# Patient Record
Sex: Female | Born: 1935 | Race: White | Hispanic: No | Smoking: Never smoker
Health system: Southern US, Community
[De-identification: ages and names within clinical notes are randomized; demographics above are authoritative.]

## PROBLEM LIST (undated history)

## (undated) DIAGNOSIS — I10 Essential (primary) hypertension: Secondary | ICD-10-CM

## (undated) DIAGNOSIS — E782 Mixed hyperlipidemia: Secondary | ICD-10-CM

## (undated) DIAGNOSIS — E119 Type 2 diabetes mellitus without complications: Secondary | ICD-10-CM

## (undated) DIAGNOSIS — N189 Chronic kidney disease, unspecified: Secondary | ICD-10-CM

## (undated) HISTORY — DX: Type 2 diabetes mellitus without complications: E11.9

## (undated) HISTORY — DX: Essential (primary) hypertension: I10

## (undated) HISTORY — DX: Mixed hyperlipidemia: E78.2

## (undated) HISTORY — DX: Chronic kidney disease, unspecified: N18.9

---

## 2005-02-17 ENCOUNTER — Ambulatory Visit: Payer: Self-pay | Admitting: Nephrology

## 2005-06-24 ENCOUNTER — Ambulatory Visit: Payer: Self-pay | Admitting: *Deleted

## 2005-09-23 ENCOUNTER — Ambulatory Visit: Payer: Self-pay | Admitting: *Deleted

## 2006-03-16 ENCOUNTER — Ambulatory Visit: Payer: Self-pay | Admitting: *Deleted

## 2006-04-08 ENCOUNTER — Ambulatory Visit: Payer: Self-pay | Admitting: Internal Medicine

## 2007-05-10 ENCOUNTER — Ambulatory Visit: Payer: Self-pay | Admitting: Internal Medicine

## 2008-07-31 ENCOUNTER — Ambulatory Visit: Payer: Self-pay | Admitting: Internal Medicine

## 2009-10-11 ENCOUNTER — Ambulatory Visit: Payer: Self-pay | Admitting: Internal Medicine

## 2011-03-05 ENCOUNTER — Ambulatory Visit: Payer: Self-pay | Admitting: Otolaryngology

## 2011-08-25 ENCOUNTER — Ambulatory Visit: Payer: Self-pay | Admitting: Internal Medicine

## 2011-08-27 ENCOUNTER — Ambulatory Visit: Payer: Self-pay | Admitting: Internal Medicine

## 2012-03-11 IMAGING — CT CT ORBITS WITHOUT CONTRAST
4 of 8 series · 12 of 30 positions shown, 13 images · non-contrast
Comparison: none

REASON FOR EXAM: cholesteatoma
COMMENTS:

[Series 8: temp prone left · axial · 0.20mm/px · z∈[-215,-184]mm · 3 of 108 slices shown, 4 images (1 of 2)]
[im 27/108  brain]
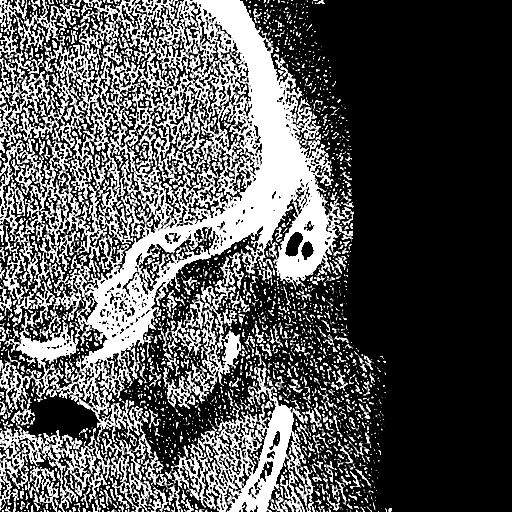
[im 27/108  bone]
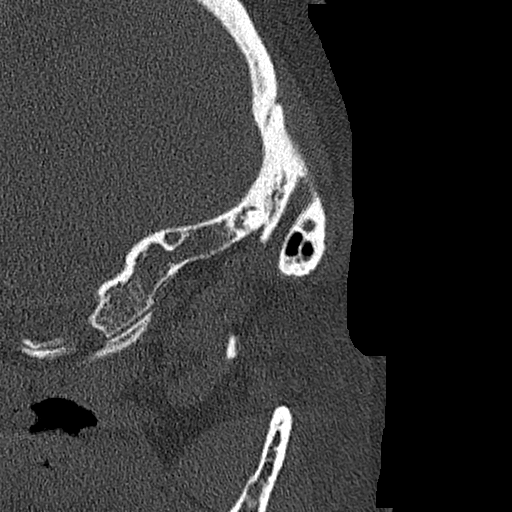
[im 54/108  bone]
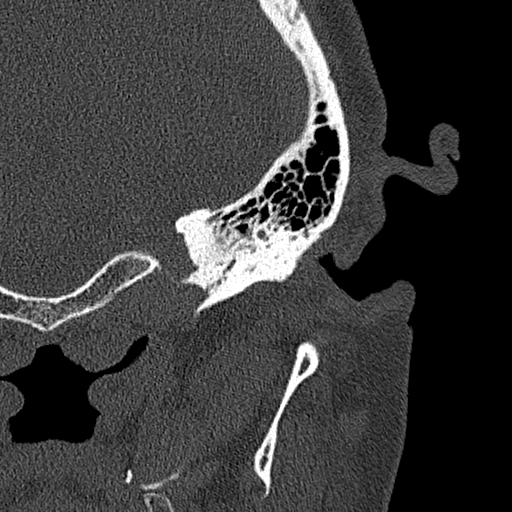
[im 81/108  bone]
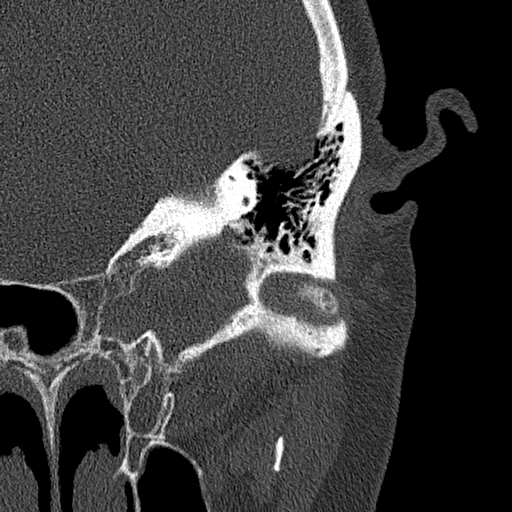

[Series 9: temp prone right · axial · 0.20mm/px · z∈[-216,-184]mm · 3 of 108 slices shown (1 of 2)]
[im 27/108  bone]
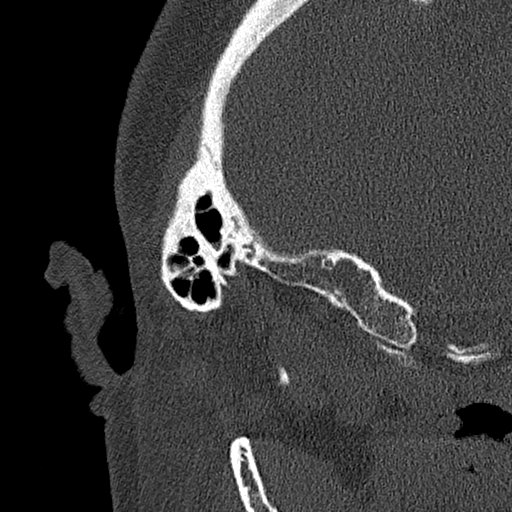
[im 54/108  bone]
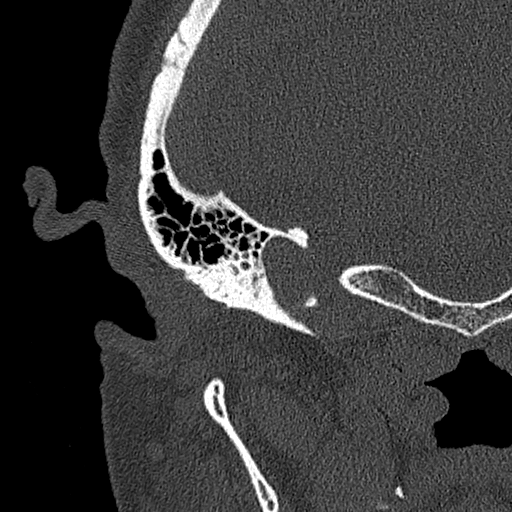
[im 81/108  bone]
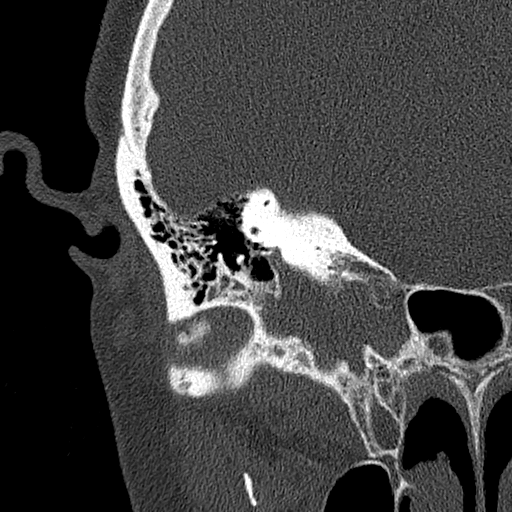

[Series 12: temp prone left · axial · 0.20mm/px · z∈[-185,-154]mm · 3 of 108 slices shown (2 of 2)]
[im 27/108  bone]
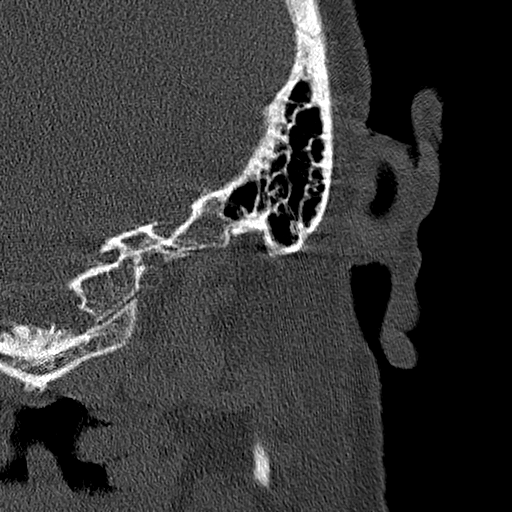
[im 54/108  bone]
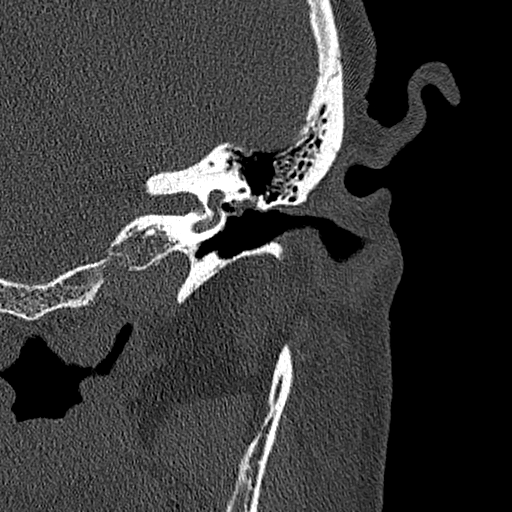
[im 81/108  bone]
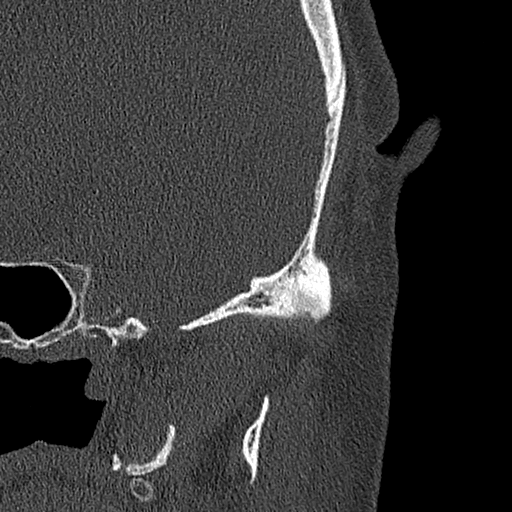

[Series 13: temp prone right · axial · 0.20mm/px · z∈[-185,-154]mm · 3 of 108 slices shown (2 of 2)]
[im 27/108  bone]
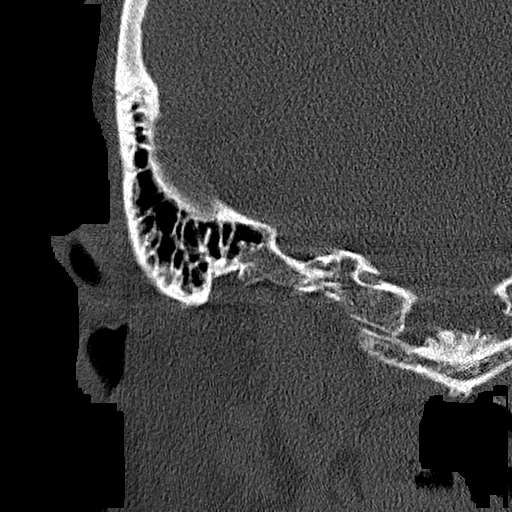
[im 54/108  bone]
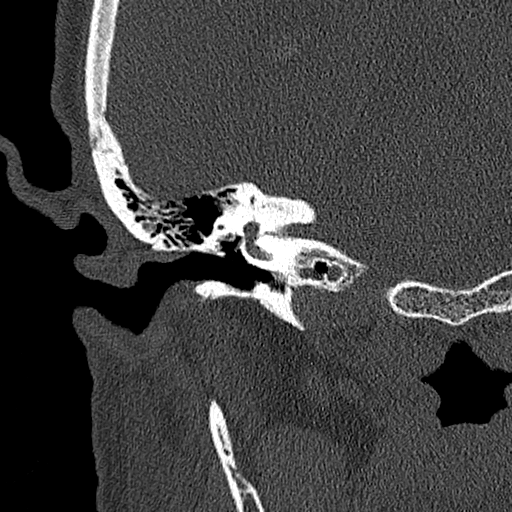
[im 81/108  bone]
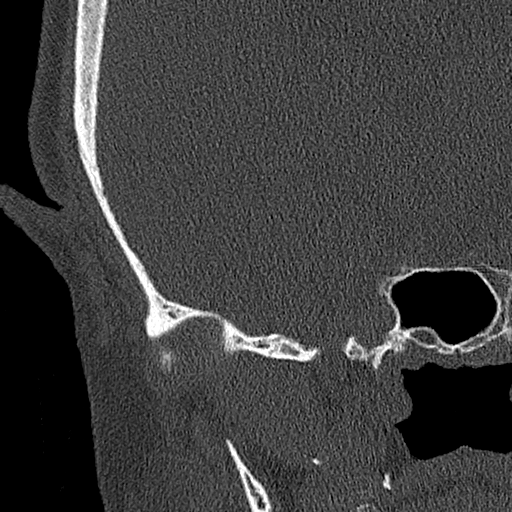

[12 of 30 positions shown; findings below may reference images not displayed]

PROCEDURE:     CT  - CT ORBITS OR TEMPORAL BONE WO  - March 05, 2011  [DATE]

RESULT:     Thin section magnified coronal and axial images using a bone
algorithm were obtained through the temporal bones.

On the right the external auditory canal is patent. The tympanic membrane
does not appear abnormally thickened. The ossicles are normal in appearance.
I do not see abnormal soft tissue densities in the middle ear cavity nor in
the mastoid air cells. The temporomandibular joint is grossly normal for
age. The internal auditory canal is normal in appearance.

On the left the external auditory canal is patent. There is mild retraction
of the tympanic membrane. The middle ear cavity demonstrates no definite
abnormal soft tissue to suggest a cholesteatoma. There is minimal soft
tissue density along the posterior wall of the middle ear cavity just
posterior to the margin of the tympanic membrane. The ossicles and cells do
not appear to be surrounded by abnormal soft tissue density. The scutum is
not eroded. No fluid is identified. The mastoid air cells are well
pneumatized.
IMPRESSION: I do not see objective evidence of a cholesteatoma on the
left or right. There is very minimal increased soft tissue density material
in the middle ear cavity which may be inflammatory. It is not diagnostic of
occult cholesteatoma. The mastoid air cells are well pneumatized. The
internal auditory canals are normal in appearance.

## 2012-08-29 ENCOUNTER — Ambulatory Visit: Payer: Self-pay | Admitting: Internal Medicine

## 2013-08-30 ENCOUNTER — Ambulatory Visit: Payer: Self-pay | Admitting: Internal Medicine

## 2014-09-03 ENCOUNTER — Ambulatory Visit: Payer: Self-pay | Admitting: Internal Medicine

## 2015-03-07 ENCOUNTER — Ambulatory Visit: Payer: Self-pay | Admitting: Gastroenterology

## 2017-04-20 DIAGNOSIS — S42201A Unspecified fracture of upper end of right humerus, initial encounter for closed fracture: Secondary | ICD-10-CM | POA: Insufficient documentation

## 2017-07-15 ENCOUNTER — Ambulatory Visit (INDEPENDENT_AMBULATORY_CARE_PROVIDER_SITE_OTHER): Payer: Self-pay | Admitting: Vascular Surgery

## 2017-07-15 ENCOUNTER — Encounter (INDEPENDENT_AMBULATORY_CARE_PROVIDER_SITE_OTHER): Payer: Self-pay

## 2020-02-05 ENCOUNTER — Ambulatory Visit: Payer: Medicare HMO | Attending: Internal Medicine

## 2020-02-05 DIAGNOSIS — Z23 Encounter for immunization: Secondary | ICD-10-CM | POA: Insufficient documentation

## 2020-02-05 NOTE — Progress Notes (Signed)
   Covid-19 Vaccination Clinic  Name:  [name] Griffin    MRN: [id number] DOB: [birth year]  02/05/2020  Ms. [name] Griffin was observed post Covid-19 immunization for 15 minutes without incidence. She was provided with Vaccine Information Sheet and instruction to access the V-Safe system.   Ms. [name] Griffin was instructed to call 911 with any severe reactions post vaccine: Marland Kitchen Difficulty breathing  . Swelling of your face and throat  . A fast heartbeat  . A bad rash all over your body  . Dizziness and weakness    Immunizations Administered    Name Date Dose VIS Date Route   Pfizer COVID-19 Vaccine 02/05/2020 10:01 AM 0.3 mL 11/24/2019 Intramuscular   Manufacturer: [employer], [employer]   Lot: [id number]   NDC: [address]-4

## 2020-02-28 ENCOUNTER — Ambulatory Visit: Payer: Medicare HMO | Attending: Internal Medicine

## 2020-02-28 DIAGNOSIS — Z23 Encounter for immunization: Secondary | ICD-10-CM

## 2020-02-28 NOTE — Progress Notes (Signed)
   Covid-19 Vaccination Clinic  Name:  [name] Griffin    MRN: [id number] DOB: 25-Sep-1928  02/28/2020  [name] Griffin was observed post Covid-19 immunization for 15 minutes without incident. She was provided with Vaccine Information Sheet and instruction to access the V-Safe system.   [name] Griffin was instructed to call 911 with any severe reactions post vaccine: Marland Kitchen Difficulty breathing  . Swelling of face and throat  . A fast heartbeat  . A bad rash all over body  . Dizziness and weakness   Immunizations Administered    Name Date Dose VIS Date Route   Pfizer COVID-19 Vaccine 02/28/2020 11:36 AM 0.3 mL 11/24/2019 Intramuscular   Manufacturer: [employer], [employer]   Lot: [id number]   NDC: [address]-6

## 2021-05-22 DIAGNOSIS — H6123 Impacted cerumen, bilateral: Secondary | ICD-10-CM | POA: Diagnosis not present

## 2021-05-22 DIAGNOSIS — H7102 Cholesteatoma of attic, left ear: Secondary | ICD-10-CM | POA: Diagnosis not present

## 2021-05-22 DIAGNOSIS — H66002 Acute suppurative otitis media without spontaneous rupture of ear drum, left ear: Secondary | ICD-10-CM | POA: Diagnosis not present

## 2021-08-04 DIAGNOSIS — I10 Essential (primary) hypertension: Secondary | ICD-10-CM | POA: Diagnosis not present

## 2021-08-04 DIAGNOSIS — E782 Mixed hyperlipidemia: Secondary | ICD-10-CM | POA: Diagnosis not present

## 2021-08-04 DIAGNOSIS — E1122 Type 2 diabetes mellitus with diabetic chronic kidney disease: Secondary | ICD-10-CM | POA: Diagnosis not present

## 2021-08-04 DIAGNOSIS — M109 Gout, unspecified: Secondary | ICD-10-CM | POA: Diagnosis not present

## 2021-08-21 DIAGNOSIS — H7102 Cholesteatoma of attic, left ear: Secondary | ICD-10-CM | POA: Diagnosis not present

## 2021-08-21 DIAGNOSIS — H65112 Acute and subacute allergic otitis media (mucoid) (sanguinous) (serous), left ear: Secondary | ICD-10-CM | POA: Diagnosis not present

## 2021-08-21 DIAGNOSIS — H6123 Impacted cerumen, bilateral: Secondary | ICD-10-CM | POA: Diagnosis not present

## 2021-09-18 DIAGNOSIS — H6123 Impacted cerumen, bilateral: Secondary | ICD-10-CM | POA: Diagnosis not present

## 2021-09-18 DIAGNOSIS — H6532 Chronic mucoid otitis media, left ear: Secondary | ICD-10-CM | POA: Diagnosis not present

## 2021-09-22 DIAGNOSIS — H353132 Nonexudative age-related macular degeneration, bilateral, intermediate dry stage: Secondary | ICD-10-CM | POA: Diagnosis not present

## 2021-11-24 DIAGNOSIS — H6532 Chronic mucoid otitis media, left ear: Secondary | ICD-10-CM | POA: Diagnosis not present

## 2021-11-24 DIAGNOSIS — H7102 Cholesteatoma of attic, left ear: Secondary | ICD-10-CM | POA: Diagnosis not present

## 2021-11-24 DIAGNOSIS — H6123 Impacted cerumen, bilateral: Secondary | ICD-10-CM | POA: Diagnosis not present

## 2022-01-05 DIAGNOSIS — E782 Mixed hyperlipidemia: Secondary | ICD-10-CM | POA: Diagnosis not present

## 2022-01-05 DIAGNOSIS — R7302 Impaired glucose tolerance (oral): Secondary | ICD-10-CM | POA: Diagnosis not present

## 2022-01-05 DIAGNOSIS — F04 Amnestic disorder due to known physiological condition: Secondary | ICD-10-CM | POA: Diagnosis not present

## 2022-01-05 DIAGNOSIS — M109 Gout, unspecified: Secondary | ICD-10-CM | POA: Diagnosis not present

## 2022-01-05 DIAGNOSIS — I10 Essential (primary) hypertension: Secondary | ICD-10-CM | POA: Diagnosis not present

## 2022-01-07 DIAGNOSIS — H7102 Cholesteatoma of attic, left ear: Secondary | ICD-10-CM | POA: Diagnosis not present

## 2022-01-07 DIAGNOSIS — H6532 Chronic mucoid otitis media, left ear: Secondary | ICD-10-CM | POA: Diagnosis not present

## 2022-01-07 DIAGNOSIS — H9210 Otorrhea, unspecified ear: Secondary | ICD-10-CM | POA: Diagnosis not present

## 2022-01-07 DIAGNOSIS — H6123 Impacted cerumen, bilateral: Secondary | ICD-10-CM | POA: Diagnosis not present

## 2022-02-04 DIAGNOSIS — H6123 Impacted cerumen, bilateral: Secondary | ICD-10-CM | POA: Diagnosis not present

## 2022-02-04 DIAGNOSIS — H7102 Cholesteatoma of attic, left ear: Secondary | ICD-10-CM | POA: Diagnosis not present

## 2022-02-27 DIAGNOSIS — R413 Other amnesia: Secondary | ICD-10-CM | POA: Diagnosis not present

## 2022-02-27 DIAGNOSIS — H9193 Unspecified hearing loss, bilateral: Secondary | ICD-10-CM | POA: Diagnosis not present

## 2022-02-27 DIAGNOSIS — Z7689 Persons encountering health services in other specified circumstances: Secondary | ICD-10-CM | POA: Diagnosis not present

## 2022-04-06 DIAGNOSIS — H7102 Cholesteatoma of attic, left ear: Secondary | ICD-10-CM | POA: Diagnosis not present

## 2022-04-06 DIAGNOSIS — H6123 Impacted cerumen, bilateral: Secondary | ICD-10-CM | POA: Diagnosis not present

## 2022-06-04 DIAGNOSIS — E119 Type 2 diabetes mellitus without complications: Secondary | ICD-10-CM | POA: Diagnosis not present

## 2022-06-04 DIAGNOSIS — E782 Mixed hyperlipidemia: Secondary | ICD-10-CM | POA: Diagnosis not present

## 2022-06-04 DIAGNOSIS — I10 Essential (primary) hypertension: Secondary | ICD-10-CM | POA: Diagnosis not present

## 2022-06-04 DIAGNOSIS — E1122 Type 2 diabetes mellitus with diabetic chronic kidney disease: Secondary | ICD-10-CM | POA: Diagnosis not present

## 2022-06-04 DIAGNOSIS — R7302 Impaired glucose tolerance (oral): Secondary | ICD-10-CM | POA: Diagnosis not present

## 2022-06-04 DIAGNOSIS — M109 Gout, unspecified: Secondary | ICD-10-CM | POA: Diagnosis not present

## 2022-06-08 DIAGNOSIS — H6123 Impacted cerumen, bilateral: Secondary | ICD-10-CM | POA: Diagnosis not present

## 2022-06-08 DIAGNOSIS — H7102 Cholesteatoma of attic, left ear: Secondary | ICD-10-CM | POA: Diagnosis not present

## 2022-06-12 DIAGNOSIS — E119 Type 2 diabetes mellitus without complications: Secondary | ICD-10-CM | POA: Diagnosis not present

## 2022-06-12 DIAGNOSIS — I10 Essential (primary) hypertension: Secondary | ICD-10-CM | POA: Diagnosis not present

## 2022-06-12 DIAGNOSIS — E785 Hyperlipidemia, unspecified: Secondary | ICD-10-CM | POA: Diagnosis not present

## 2022-07-20 DIAGNOSIS — H7102 Cholesteatoma of attic, left ear: Secondary | ICD-10-CM | POA: Diagnosis not present

## 2022-07-20 DIAGNOSIS — H6123 Impacted cerumen, bilateral: Secondary | ICD-10-CM | POA: Diagnosis not present

## 2022-08-31 DIAGNOSIS — H6123 Impacted cerumen, bilateral: Secondary | ICD-10-CM | POA: Diagnosis not present

## 2022-08-31 DIAGNOSIS — H7102 Cholesteatoma of attic, left ear: Secondary | ICD-10-CM | POA: Diagnosis not present

## 2022-09-12 DIAGNOSIS — E119 Type 2 diabetes mellitus without complications: Secondary | ICD-10-CM | POA: Diagnosis not present

## 2022-09-12 DIAGNOSIS — I10 Essential (primary) hypertension: Secondary | ICD-10-CM | POA: Diagnosis not present

## 2022-10-12 DIAGNOSIS — H7102 Cholesteatoma of attic, left ear: Secondary | ICD-10-CM | POA: Diagnosis not present

## 2022-10-12 DIAGNOSIS — H6123 Impacted cerumen, bilateral: Secondary | ICD-10-CM | POA: Diagnosis not present

## 2022-11-23 DIAGNOSIS — H6123 Impacted cerumen, bilateral: Secondary | ICD-10-CM | POA: Diagnosis not present

## 2022-11-23 DIAGNOSIS — H7102 Cholesteatoma of attic, left ear: Secondary | ICD-10-CM | POA: Diagnosis not present

## 2022-12-11 DIAGNOSIS — Z0000 Encounter for general adult medical examination without abnormal findings: Secondary | ICD-10-CM | POA: Diagnosis not present

## 2022-12-11 DIAGNOSIS — M109 Gout, unspecified: Secondary | ICD-10-CM | POA: Diagnosis not present

## 2022-12-11 DIAGNOSIS — I10 Essential (primary) hypertension: Secondary | ICD-10-CM | POA: Diagnosis not present

## 2022-12-11 DIAGNOSIS — Z23 Encounter for immunization: Secondary | ICD-10-CM | POA: Diagnosis not present

## 2022-12-11 DIAGNOSIS — Z0001 Encounter for general adult medical examination with abnormal findings: Secondary | ICD-10-CM | POA: Diagnosis not present

## 2022-12-11 DIAGNOSIS — E782 Mixed hyperlipidemia: Secondary | ICD-10-CM | POA: Diagnosis not present

## 2022-12-11 DIAGNOSIS — R7302 Impaired glucose tolerance (oral): Secondary | ICD-10-CM | POA: Diagnosis not present

## 2022-12-12 DIAGNOSIS — I10 Essential (primary) hypertension: Secondary | ICD-10-CM | POA: Diagnosis not present

## 2022-12-12 DIAGNOSIS — E119 Type 2 diabetes mellitus without complications: Secondary | ICD-10-CM | POA: Diagnosis not present

## 2023-01-04 DIAGNOSIS — H9212 Otorrhea, left ear: Secondary | ICD-10-CM | POA: Diagnosis not present

## 2023-01-04 DIAGNOSIS — H7102 Cholesteatoma of attic, left ear: Secondary | ICD-10-CM | POA: Diagnosis not present

## 2023-02-17 DIAGNOSIS — H6532 Chronic mucoid otitis media, left ear: Secondary | ICD-10-CM | POA: Diagnosis not present

## 2023-02-17 DIAGNOSIS — H6123 Impacted cerumen, bilateral: Secondary | ICD-10-CM | POA: Diagnosis not present

## 2023-02-17 DIAGNOSIS — H7102 Cholesteatoma of attic, left ear: Secondary | ICD-10-CM | POA: Diagnosis not present

## 2023-02-24 ENCOUNTER — Telehealth: Payer: Self-pay

## 2023-02-24 NOTE — Telephone Encounter (Signed)
HTN Review Call  [name],[name]  89 years, Female  DOB: 04-Jul-1928  M: [phone]  __________________________________________________ Hypertension Review (HC) Chart Review BP #1 reading (last): 120/60 on: 12/11/2022 BP #2 reading: 122/70 on: 06/04/2022 BP #3 reading: 118/72 on: 01/05/2022 Any of the last 3 BP > 140/90 mmHg?: No What recent interventions have been made by any provider to improve the patient's conditions in the last 3 months?: Office Visit: 12/11/22 [name] Maltese MD. For AWV. STARTED Donepezil 5 mg once a day.  Consults: 01/04/23 Otolaryngology [name] Griffin For left ear. No information available for any medication changes. Has there been any documented recent hospitalizations or ED visits since last visit with Clinical Lead?: No Adherence Review Does the [facility] have access to medication refill data?: Yes Adherence rates for STAR metric medications: Lisinopril 5 mg - 12/13/22 90 DS Adherence rates for medications indicated for disease state being reviewed: Lisinopril 5 mg - 12/13/22 90 DS Does the patient have >5 day gap between last estimated fill dates for any of the above medications?: No Disease State Questions Able to connect with the Patient?: Yes Is the patient monitoring his/her BP?: Yes How often are you checking your BP?: occasionally Home BP Reading #1 (most recent): Per patient its been good . I asked her to start writing them down .  Is the patient having any low BP Readings <90/60?: No Is the patient having any BP readings above >180/100?: No Is the patient's average BP>140/90?: No What is your blood pressure goal?: 120/80 Educate patient to inform proper points on checking BP at home:: Do not drink caffeine or smoke a cigarette at least 30 min. prior to checking., Sit with feet flat on the floor, arm at heart level. What diet changes have you made to improve your Blood Pressure Control?: eating more home-cooked meals, other Details: Meals on Wheels   What exercise are you doing to improve your Blood Pressure Control?: walking Engagement Notes [name] Griffin on 02/22/2023 09:12 AM HC Chart Review: 10 min 02/22/23  HC Assessment call time spent: 5 min 02/24/23   Clinical Lead Review Review Adherence gaps identified?: No Drug Therapy Problems identified?: No Assessment: Controlled Engagement Notes [name] Griffin on 02/24/2023 04:36 PM Reviewed: 2 mins

## 2023-04-30 DIAGNOSIS — H6121 Impacted cerumen, right ear: Secondary | ICD-10-CM | POA: Diagnosis not present

## 2023-04-30 DIAGNOSIS — H7102 Cholesteatoma of attic, left ear: Secondary | ICD-10-CM | POA: Diagnosis not present

## 2023-04-30 DIAGNOSIS — H6532 Chronic mucoid otitis media, left ear: Secondary | ICD-10-CM | POA: Diagnosis not present

## 2023-06-09 ENCOUNTER — Other Ambulatory Visit: Payer: Self-pay | Admitting: Internal Medicine

## 2023-06-11 ENCOUNTER — Ambulatory Visit: Payer: Medicare HMO | Admitting: Internal Medicine

## 2023-06-11 DIAGNOSIS — H7102 Cholesteatoma of attic, left ear: Secondary | ICD-10-CM | POA: Diagnosis not present

## 2023-06-16 ENCOUNTER — Other Ambulatory Visit: Payer: Self-pay | Admitting: Internal Medicine

## 2023-06-16 DIAGNOSIS — I10 Essential (primary) hypertension: Secondary | ICD-10-CM

## 2023-06-23 ENCOUNTER — Other Ambulatory Visit: Payer: Self-pay | Admitting: Internal Medicine

## 2023-06-23 NOTE — Telephone Encounter (Signed)
Patient needs appt for future refills 

## 2023-07-26 ENCOUNTER — Telehealth: Payer: Self-pay

## 2023-07-27 ENCOUNTER — Ambulatory Visit: Payer: Self-pay | Admitting: Internal Medicine

## 2023-07-27 ENCOUNTER — Encounter: Payer: Self-pay | Admitting: Internal Medicine

## 2023-07-27 VITALS — BP 130/74 | HR 57 | Ht 60.0 in | Wt 150.0 lb

## 2023-07-27 DIAGNOSIS — E119 Type 2 diabetes mellitus without complications: Secondary | ICD-10-CM | POA: Diagnosis not present

## 2023-07-27 DIAGNOSIS — H9113 Presbycusis, bilateral: Secondary | ICD-10-CM | POA: Diagnosis not present

## 2023-07-27 DIAGNOSIS — M1000 Idiopathic gout, unspecified site: Secondary | ICD-10-CM | POA: Diagnosis not present

## 2023-07-27 DIAGNOSIS — E782 Mixed hyperlipidemia: Secondary | ICD-10-CM | POA: Diagnosis not present

## 2023-07-27 DIAGNOSIS — N182 Chronic kidney disease, stage 2 (mild): Secondary | ICD-10-CM | POA: Diagnosis not present

## 2023-07-27 DIAGNOSIS — E1122 Type 2 diabetes mellitus with diabetic chronic kidney disease: Secondary | ICD-10-CM | POA: Diagnosis not present

## 2023-07-27 DIAGNOSIS — I10 Essential (primary) hypertension: Secondary | ICD-10-CM | POA: Diagnosis not present

## 2023-07-27 LAB — POCT CBG (FASTING - GLUCOSE)-MANUAL ENTRY: Glucose Fasting, POC: 72 mg/dL (ref 70–99)

## 2023-07-27 NOTE — Progress Notes (Signed)
Established Patient Office Visit  Subjective:  Patient ID: [name] Griffin, female    DOB: [birth year]  Age: 89 y.o. MRN: [id number]  Chief Complaint  Patient presents with   Follow-up    Follow up    Patient comes in for her 39-month follow-up.  She is accompanied by her family members.  Patient is very hard of hearing and has trouble answering questions but seems to be in good spirits and looks well put together.  She still lives independently with her family members checking on her.  Offers no new complaints.    No other concerns at this time.   Past Medical History:  Diagnosis Date   CKD (chronic kidney disease)    Diabetes mellitus without complication (HCC)    Essential hypertension, benign    Mixed hyperlipidemia     History reviewed. No pertinent surgical history.  Social History   Socioeconomic History   Marital status: Widowed    Spouse name: Not on file   Number of children: Not on file   Years of education: Not on file   Highest education level: Not on file  Occupational History   Not on file  Tobacco Use   Smoking status: Never   Smokeless tobacco: Never  Substance and Sexual Activity   Alcohol use: Not Currently   Drug use: Never   Sexual activity: Not on file  Other Topics Concern   Not on file  Social History Narrative   Not on file   Social Determinants of Health   Financial Resource Strain: Not on file  Food Insecurity: Not on file  Transportation Needs: Not on file  Physical Activity: Not on file  Stress: Not on file  Social Connections: Not on file  Intimate Partner Violence: Not on file    History reviewed. No pertinent family history.  No Known Allergies  Review of Systems  Constitutional: Negative.   HENT:  Positive for hearing loss. Negative for ear discharge.   Eyes: Negative.   Respiratory: Negative.  Negative for cough, shortness of breath and stridor.   Cardiovascular: Negative.  Negative for chest pain, palpitations and  leg swelling.  Gastrointestinal: Negative.  Negative for abdominal pain, constipation, diarrhea, heartburn, nausea and vomiting.  Genitourinary: Negative.  Negative for dysuria and flank pain.  Musculoskeletal: Negative.  Negative for joint pain and myalgias.  Skin: Negative.   Neurological: Negative.  Negative for dizziness and headaches.  Endo/Heme/Allergies: Negative.   Psychiatric/Behavioral: Negative.  Negative for depression and suicidal ideas. The patient is not nervous/anxious.        Objective:   BP 130/74   Pulse (!) 57   Ht 5' (1.524 m)   Wt 150 lb (68 kg)   SpO2 93%   BMI 29.29 kg/m   Vitals:   07/27/23 1142  BP: 130/74  Pulse: (!) 57  Height: 5' (1.524 m)  Weight: 150 lb (68 kg)  SpO2: 93%  BMI (Calculated): 29.3    Physical Exam Vitals and nursing note reviewed.  Constitutional:      Appearance: Normal appearance.  HENT:     Head: Normocephalic and atraumatic.     Nose: Nose normal.     Mouth/Throat:     Mouth: Mucous membranes are moist.     Pharynx: Oropharynx is clear.  Eyes:     Conjunctiva/sclera: Conjunctivae normal.     Pupils: Pupils are equal, round, and reactive to light.  Cardiovascular:     Rate and Rhythm: Normal rate  and regular rhythm.     Pulses: Normal pulses.     Heart sounds: Normal heart sounds. No murmur heard. Pulmonary:     Effort: Pulmonary effort is normal.     Breath sounds: Normal breath sounds. No wheezing.  Abdominal:     General: Bowel sounds are normal.     Palpations: Abdomen is soft.     Tenderness: There is no abdominal tenderness. There is no right CVA tenderness or left CVA tenderness.  Musculoskeletal:        General: Normal range of motion.     Cervical back: Normal range of motion.     Right lower leg: No edema.     Left lower leg: No edema.  Skin:    General: Skin is warm and dry.  Neurological:     General: No focal deficit present.     Mental Status: She is alert and oriented to person, place, and  time.  Psychiatric:        Mood and Affect: Mood normal.        Behavior: Behavior normal.      Results for orders placed or performed in visit on 07/27/23  POCT CBG (Fasting - Glucose)  Result Value Ref Range   Glucose Fasting, POC 72 70 - 99 mg/dL    Recent Results (from the past 2160 hour(s))  POCT CBG (Fasting - Glucose)     Status: None   Collection Time: 07/27/23 11:47 AM  Result Value Ref Range   Glucose Fasting, POC 72 70 - 99 mg/dL      Assessment & Plan:  Advised to continue her current medication.  Will check labs today. Problem List Items Addressed This Visit     Type 2 diabetes mellitus without complication, without long-term current use of insulin (HCC) - Primary   Relevant Orders   POCT CBG (Fasting - Glucose) (Completed)   Hemoglobin A1c   Idiopathic gout   Relevant Orders   CBC with Diff   Uric acid   Essential hypertension, benign   Relevant Orders   CMP14+EGFR   Stage 2 chronic kidney disease   Presbycusis of both ears   Mixed hyperlipidemia    Return in about 6 months (around 01/27/2024).   Total time spent: 30 minutes  Margaretann Loveless, MD  07/27/2023   This document may have been prepared by [facility] Voice Recognition software and as such may include unintentional dictation errors.

## 2023-07-29 NOTE — Progress Notes (Signed)
Patient notified

## 2023-08-06 DIAGNOSIS — H7102 Cholesteatoma of attic, left ear: Secondary | ICD-10-CM | POA: Diagnosis not present

## 2023-08-06 DIAGNOSIS — H6123 Impacted cerumen, bilateral: Secondary | ICD-10-CM | POA: Diagnosis not present

## 2023-09-19 ENCOUNTER — Other Ambulatory Visit: Payer: Self-pay | Admitting: Internal Medicine

## 2023-10-06 DIAGNOSIS — H6123 Impacted cerumen, bilateral: Secondary | ICD-10-CM | POA: Diagnosis not present

## 2023-10-06 DIAGNOSIS — H7102 Cholesteatoma of attic, left ear: Secondary | ICD-10-CM | POA: Diagnosis not present

## 2023-11-17 DIAGNOSIS — H7102 Cholesteatoma of attic, left ear: Secondary | ICD-10-CM | POA: Diagnosis not present

## 2023-11-17 DIAGNOSIS — H6123 Impacted cerumen, bilateral: Secondary | ICD-10-CM | POA: Diagnosis not present

## 2023-12-01 ENCOUNTER — Other Ambulatory Visit: Payer: Self-pay | Admitting: Internal Medicine

## 2023-12-01 DIAGNOSIS — I10 Essential (primary) hypertension: Secondary | ICD-10-CM

## 2023-12-08 ENCOUNTER — Other Ambulatory Visit: Payer: Self-pay | Admitting: Internal Medicine

## 2023-12-16 ENCOUNTER — Other Ambulatory Visit: Payer: Self-pay | Admitting: Internal Medicine

## 2023-12-16 DIAGNOSIS — I10 Essential (primary) hypertension: Secondary | ICD-10-CM

## 2023-12-29 DIAGNOSIS — H6123 Impacted cerumen, bilateral: Secondary | ICD-10-CM | POA: Diagnosis not present

## 2023-12-29 DIAGNOSIS — H7102 Cholesteatoma of attic, left ear: Secondary | ICD-10-CM | POA: Diagnosis not present

## 2024-01-27 ENCOUNTER — Encounter: Payer: Self-pay | Admitting: Internal Medicine

## 2024-01-27 ENCOUNTER — Ambulatory Visit: Payer: Medicare HMO | Admitting: Internal Medicine

## 2024-01-27 VITALS — BP 144/80 | HR 68 | Ht 60.0 in

## 2024-01-27 DIAGNOSIS — I10 Essential (primary) hypertension: Secondary | ICD-10-CM

## 2024-01-27 DIAGNOSIS — R413 Other amnesia: Secondary | ICD-10-CM | POA: Diagnosis not present

## 2024-01-27 DIAGNOSIS — N182 Chronic kidney disease, stage 2 (mild): Secondary | ICD-10-CM

## 2024-01-27 DIAGNOSIS — M1000 Idiopathic gout, unspecified site: Secondary | ICD-10-CM

## 2024-01-27 DIAGNOSIS — H9113 Presbycusis, bilateral: Secondary | ICD-10-CM | POA: Diagnosis not present

## 2024-01-27 DIAGNOSIS — E119 Type 2 diabetes mellitus without complications: Secondary | ICD-10-CM

## 2024-01-27 MED ORDER — DONEPEZIL HCL 10 MG PO TABS: 10.0000 mg | ORAL_TABLET | Freq: Every day | ORAL | 2 refills | Status: DC

## 2024-01-27 NOTE — Progress Notes (Signed)
Established Patient Office Visit  Subjective:  Patient ID: [name] Griffin, female    DOB: [birth year]  Age: 89 y.o. MRN: [id number]  Chief Complaint  Patient presents with   Follow-up    6 month follow up    Patient comes in for her 73-month follow-up accompanied by her family members.  She seems to be in good spirits and and nicely dressed.  She is still hard of hearing and only her right sided hearing aid works.  Getting more forgetful now, will increase her Aricept to 10 mg/day.  Mentions knee pain which is now affecting her ambulation.  Will set up home physical therapy for her.  She still lives independently at her house and gets Meals on Wheels.  No other complaints. Will check labs on next visit.    No other concerns at this time.   Past Medical History:  Diagnosis Date   CKD (chronic kidney disease)    Diabetes mellitus without complication (HCC)    Essential hypertension, benign    Mixed hyperlipidemia     History reviewed. No pertinent surgical history.  Social History   Socioeconomic History   Marital status: Widowed    Spouse name: Not on file   Number of children: Not on file   Years of education: Not on file   Highest education level: Not on file  Occupational History   Not on file  Tobacco Use   Smoking status: Never   Smokeless tobacco: Never  Substance and Sexual Activity   Alcohol use: Not Currently   Drug use: Never   Sexual activity: Not on file  Other Topics Concern   Not on file  Social History Narrative   Not on file   Social Drivers of Health   Financial Resource Strain: Not on file  Food Insecurity: Not on file  Transportation Needs: Not on file  Physical Activity: Not on file  Stress: Not on file  Social Connections: Not on file  Intimate Partner Violence: Not on file    History reviewed. No pertinent family history.  No Known Allergies  Outpatient Medications Prior to Visit  Medication Sig   allopurinol (ZYLOPRIM) 100 MG  tablet Take 100 mg by mouth daily.   atenolol (TENORMIN) 25 MG tablet TAKE 1 TABLET BY MOUTH EVERY DAY   furosemide (LASIX) 20 MG tablet TAKE 1 TABLET BY MOUTH EVERY DAY   lisinopril (ZESTRIL) 5 MG tablet TAKE 1 TABLET BY MOUTH EVERY DAY   [DISCONTINUED] donepezil (ARICEPT) 5 MG tablet TAKE 1 TABLET BY MOUTH EVERY DAY   No facility-administered medications prior to visit.    Review of Systems  Constitutional: Negative.  Negative for chills, fever, malaise/fatigue and weight loss.  HENT:  Positive for hearing loss. Negative for congestion, sinus pain and tinnitus.   Eyes: Negative.   Respiratory: Negative.  Negative for cough and shortness of breath.   Cardiovascular: Negative.  Negative for chest pain, palpitations and leg swelling.  Gastrointestinal: Negative.  Negative for abdominal pain, constipation, diarrhea, heartburn, nausea and vomiting.  Genitourinary: Negative.  Negative for dysuria and flank pain.  Musculoskeletal:  Positive for joint pain. Negative for myalgias.  Skin: Negative.   Neurological: Negative.  Negative for dizziness, tingling, tremors, sensory change, focal weakness and headaches.  Endo/Heme/Allergies: Negative.   Psychiatric/Behavioral: Negative.  Negative for depression and suicidal ideas. The patient is not nervous/anxious.        Objective:   BP (!) 144/80   Pulse 68  Ht 5' (1.524 m)   SpO2 95%   BMI 29.29 kg/m   Vitals:   01/27/24 1259  BP: (!) 144/80  Pulse: 68  Height: 5' (1.524 m)  Weight: Comment: unable to weigh  SpO2: 95%    Physical Exam Vitals and nursing note reviewed.  Constitutional:      Appearance: Normal appearance.  HENT:     Head: Normocephalic and atraumatic.     Nose: Nose normal.     Mouth/Throat:     Mouth: Mucous membranes are moist.     Pharynx: Oropharynx is clear.  Eyes:     Conjunctiva/sclera: Conjunctivae normal.     Pupils: Pupils are equal, round, and reactive to light.  Cardiovascular:     Rate and  Rhythm: Normal rate and regular rhythm.     Pulses: Normal pulses.     Heart sounds: Normal heart sounds. No murmur heard. Pulmonary:     Effort: Pulmonary effort is normal.     Breath sounds: Normal breath sounds. No wheezing.  Abdominal:     General: Bowel sounds are normal.     Palpations: Abdomen is soft.     Tenderness: There is no abdominal tenderness. There is no right CVA tenderness or left CVA tenderness.  Musculoskeletal:        General: Normal range of motion.     Cervical back: Normal range of motion.     Right lower leg: No edema.     Left lower leg: No edema.  Skin:    General: Skin is warm and dry.  Neurological:     General: No focal deficit present.     Mental Status: She is alert and oriented to person, place, and time.  Psychiatric:        Mood and Affect: Mood normal.        Behavior: Behavior normal.      No results found for any visits on 01/27/24.  No results found for this or any previous visit (from the past 2160 hours).    Assessment & Plan:  Patient advised to continue her medications.  Set up home health with physical therapy.  Will check labs at next visit. Problem List Items Addressed This Visit     Type 2 diabetes mellitus without complication, without long-term current use of insulin (HCC)   Idiopathic gout   Essential hypertension, benign - Primary   Stage 2 chronic kidney disease   Presbycusis of both ears   Memory impairment   Relevant Medications   donepezil (ARICEPT) 10 MG tablet    Return in about 6 months (around 07/26/2024).   Total time spent: 30 minutes  Margaretann Loveless, MD  01/27/2024   This document may have been prepared by [facility] Voice Recognition software and as such may include unintentional dictation errors.

## 2024-02-04 ENCOUNTER — Other Ambulatory Visit: Payer: Self-pay | Admitting: Internal Medicine

## 2024-02-04 DIAGNOSIS — M1712 Unilateral primary osteoarthritis, left knee: Secondary | ICD-10-CM

## 2024-02-04 DIAGNOSIS — G8929 Other chronic pain: Secondary | ICD-10-CM

## 2024-02-08 DIAGNOSIS — R413 Other amnesia: Secondary | ICD-10-CM | POA: Diagnosis not present

## 2024-02-08 DIAGNOSIS — I129 Hypertensive chronic kidney disease with stage 1 through stage 4 chronic kidney disease, or unspecified chronic kidney disease: Secondary | ICD-10-CM | POA: Diagnosis not present

## 2024-02-08 DIAGNOSIS — E1122 Type 2 diabetes mellitus with diabetic chronic kidney disease: Secondary | ICD-10-CM | POA: Diagnosis not present

## 2024-02-08 DIAGNOSIS — N182 Chronic kidney disease, stage 2 (mild): Secondary | ICD-10-CM | POA: Diagnosis not present

## 2024-02-08 DIAGNOSIS — M1712 Unilateral primary osteoarthritis, left knee: Secondary | ICD-10-CM | POA: Diagnosis not present

## 2024-02-08 DIAGNOSIS — Z9181 History of falling: Secondary | ICD-10-CM | POA: Diagnosis not present

## 2024-02-08 DIAGNOSIS — H9113 Presbycusis, bilateral: Secondary | ICD-10-CM | POA: Diagnosis not present

## 2024-02-08 DIAGNOSIS — E782 Mixed hyperlipidemia: Secondary | ICD-10-CM | POA: Diagnosis not present

## 2024-02-08 DIAGNOSIS — M1000 Idiopathic gout, unspecified site: Secondary | ICD-10-CM | POA: Diagnosis not present

## 2024-02-09 DIAGNOSIS — H6123 Impacted cerumen, bilateral: Secondary | ICD-10-CM | POA: Diagnosis not present

## 2024-02-09 DIAGNOSIS — H7102 Cholesteatoma of attic, left ear: Secondary | ICD-10-CM | POA: Diagnosis not present

## 2024-02-09 DIAGNOSIS — H6532 Chronic mucoid otitis media, left ear: Secondary | ICD-10-CM | POA: Diagnosis not present

## 2024-02-14 DIAGNOSIS — Z9181 History of falling: Secondary | ICD-10-CM | POA: Diagnosis not present

## 2024-02-14 DIAGNOSIS — E782 Mixed hyperlipidemia: Secondary | ICD-10-CM | POA: Diagnosis not present

## 2024-02-14 DIAGNOSIS — I129 Hypertensive chronic kidney disease with stage 1 through stage 4 chronic kidney disease, or unspecified chronic kidney disease: Secondary | ICD-10-CM | POA: Diagnosis not present

## 2024-02-14 DIAGNOSIS — R413 Other amnesia: Secondary | ICD-10-CM | POA: Diagnosis not present

## 2024-02-14 DIAGNOSIS — M1000 Idiopathic gout, unspecified site: Secondary | ICD-10-CM | POA: Diagnosis not present

## 2024-02-14 DIAGNOSIS — E1122 Type 2 diabetes mellitus with diabetic chronic kidney disease: Secondary | ICD-10-CM | POA: Diagnosis not present

## 2024-02-14 DIAGNOSIS — M1712 Unilateral primary osteoarthritis, left knee: Secondary | ICD-10-CM | POA: Diagnosis not present

## 2024-02-14 DIAGNOSIS — H9113 Presbycusis, bilateral: Secondary | ICD-10-CM | POA: Diagnosis not present

## 2024-02-14 DIAGNOSIS — N182 Chronic kidney disease, stage 2 (mild): Secondary | ICD-10-CM | POA: Diagnosis not present

## 2024-02-23 DIAGNOSIS — E782 Mixed hyperlipidemia: Secondary | ICD-10-CM | POA: Diagnosis not present

## 2024-02-23 DIAGNOSIS — N182 Chronic kidney disease, stage 2 (mild): Secondary | ICD-10-CM | POA: Diagnosis not present

## 2024-02-23 DIAGNOSIS — R413 Other amnesia: Secondary | ICD-10-CM | POA: Diagnosis not present

## 2024-02-23 DIAGNOSIS — H9113 Presbycusis, bilateral: Secondary | ICD-10-CM | POA: Diagnosis not present

## 2024-02-23 DIAGNOSIS — M1712 Unilateral primary osteoarthritis, left knee: Secondary | ICD-10-CM | POA: Diagnosis not present

## 2024-02-23 DIAGNOSIS — I129 Hypertensive chronic kidney disease with stage 1 through stage 4 chronic kidney disease, or unspecified chronic kidney disease: Secondary | ICD-10-CM | POA: Diagnosis not present

## 2024-02-23 DIAGNOSIS — Z9181 History of falling: Secondary | ICD-10-CM | POA: Diagnosis not present

## 2024-02-23 DIAGNOSIS — E1122 Type 2 diabetes mellitus with diabetic chronic kidney disease: Secondary | ICD-10-CM | POA: Diagnosis not present

## 2024-02-23 DIAGNOSIS — M1000 Idiopathic gout, unspecified site: Secondary | ICD-10-CM | POA: Diagnosis not present

## 2024-03-06 ENCOUNTER — Other Ambulatory Visit: Payer: Self-pay | Admitting: Internal Medicine

## 2024-03-07 DIAGNOSIS — Z9181 History of falling: Secondary | ICD-10-CM | POA: Diagnosis not present

## 2024-03-07 DIAGNOSIS — R413 Other amnesia: Secondary | ICD-10-CM | POA: Diagnosis not present

## 2024-03-07 DIAGNOSIS — H9113 Presbycusis, bilateral: Secondary | ICD-10-CM | POA: Diagnosis not present

## 2024-03-07 DIAGNOSIS — M1000 Idiopathic gout, unspecified site: Secondary | ICD-10-CM | POA: Diagnosis not present

## 2024-03-07 DIAGNOSIS — E1122 Type 2 diabetes mellitus with diabetic chronic kidney disease: Secondary | ICD-10-CM | POA: Diagnosis not present

## 2024-03-07 DIAGNOSIS — M1712 Unilateral primary osteoarthritis, left knee: Secondary | ICD-10-CM | POA: Diagnosis not present

## 2024-03-07 DIAGNOSIS — N182 Chronic kidney disease, stage 2 (mild): Secondary | ICD-10-CM | POA: Diagnosis not present

## 2024-03-07 DIAGNOSIS — E782 Mixed hyperlipidemia: Secondary | ICD-10-CM | POA: Diagnosis not present

## 2024-03-07 DIAGNOSIS — I129 Hypertensive chronic kidney disease with stage 1 through stage 4 chronic kidney disease, or unspecified chronic kidney disease: Secondary | ICD-10-CM | POA: Diagnosis not present

## 2024-03-09 DIAGNOSIS — E782 Mixed hyperlipidemia: Secondary | ICD-10-CM | POA: Diagnosis not present

## 2024-03-09 DIAGNOSIS — E1122 Type 2 diabetes mellitus with diabetic chronic kidney disease: Secondary | ICD-10-CM | POA: Diagnosis not present

## 2024-03-09 DIAGNOSIS — Z9181 History of falling: Secondary | ICD-10-CM | POA: Diagnosis not present

## 2024-03-09 DIAGNOSIS — R413 Other amnesia: Secondary | ICD-10-CM | POA: Diagnosis not present

## 2024-03-09 DIAGNOSIS — N182 Chronic kidney disease, stage 2 (mild): Secondary | ICD-10-CM | POA: Diagnosis not present

## 2024-03-09 DIAGNOSIS — I129 Hypertensive chronic kidney disease with stage 1 through stage 4 chronic kidney disease, or unspecified chronic kidney disease: Secondary | ICD-10-CM | POA: Diagnosis not present

## 2024-03-09 DIAGNOSIS — M1000 Idiopathic gout, unspecified site: Secondary | ICD-10-CM | POA: Diagnosis not present

## 2024-03-09 DIAGNOSIS — H9113 Presbycusis, bilateral: Secondary | ICD-10-CM | POA: Diagnosis not present

## 2024-03-09 DIAGNOSIS — M1712 Unilateral primary osteoarthritis, left knee: Secondary | ICD-10-CM | POA: Diagnosis not present

## 2024-03-13 DIAGNOSIS — N182 Chronic kidney disease, stage 2 (mild): Secondary | ICD-10-CM | POA: Diagnosis not present

## 2024-03-13 DIAGNOSIS — I129 Hypertensive chronic kidney disease with stage 1 through stage 4 chronic kidney disease, or unspecified chronic kidney disease: Secondary | ICD-10-CM | POA: Diagnosis not present

## 2024-03-13 DIAGNOSIS — E1122 Type 2 diabetes mellitus with diabetic chronic kidney disease: Secondary | ICD-10-CM | POA: Diagnosis not present

## 2024-03-13 DIAGNOSIS — E782 Mixed hyperlipidemia: Secondary | ICD-10-CM | POA: Diagnosis not present

## 2024-03-13 DIAGNOSIS — M1000 Idiopathic gout, unspecified site: Secondary | ICD-10-CM | POA: Diagnosis not present

## 2024-03-13 DIAGNOSIS — R413 Other amnesia: Secondary | ICD-10-CM | POA: Diagnosis not present

## 2024-03-13 DIAGNOSIS — M1712 Unilateral primary osteoarthritis, left knee: Secondary | ICD-10-CM | POA: Diagnosis not present

## 2024-03-13 DIAGNOSIS — H9113 Presbycusis, bilateral: Secondary | ICD-10-CM | POA: Diagnosis not present

## 2024-03-13 DIAGNOSIS — Z9181 History of falling: Secondary | ICD-10-CM | POA: Diagnosis not present

## 2024-03-24 DIAGNOSIS — I129 Hypertensive chronic kidney disease with stage 1 through stage 4 chronic kidney disease, or unspecified chronic kidney disease: Secondary | ICD-10-CM | POA: Diagnosis not present

## 2024-03-24 DIAGNOSIS — M1000 Idiopathic gout, unspecified site: Secondary | ICD-10-CM | POA: Diagnosis not present

## 2024-03-24 DIAGNOSIS — E782 Mixed hyperlipidemia: Secondary | ICD-10-CM | POA: Diagnosis not present

## 2024-03-24 DIAGNOSIS — E1122 Type 2 diabetes mellitus with diabetic chronic kidney disease: Secondary | ICD-10-CM | POA: Diagnosis not present

## 2024-03-24 DIAGNOSIS — Z9181 History of falling: Secondary | ICD-10-CM | POA: Diagnosis not present

## 2024-03-24 DIAGNOSIS — R413 Other amnesia: Secondary | ICD-10-CM | POA: Diagnosis not present

## 2024-03-24 DIAGNOSIS — N182 Chronic kidney disease, stage 2 (mild): Secondary | ICD-10-CM | POA: Diagnosis not present

## 2024-03-24 DIAGNOSIS — M1712 Unilateral primary osteoarthritis, left knee: Secondary | ICD-10-CM | POA: Diagnosis not present

## 2024-03-24 DIAGNOSIS — H9113 Presbycusis, bilateral: Secondary | ICD-10-CM | POA: Diagnosis not present

## 2024-03-30 DIAGNOSIS — E782 Mixed hyperlipidemia: Secondary | ICD-10-CM | POA: Diagnosis not present

## 2024-03-30 DIAGNOSIS — N182 Chronic kidney disease, stage 2 (mild): Secondary | ICD-10-CM | POA: Diagnosis not present

## 2024-03-30 DIAGNOSIS — M1000 Idiopathic gout, unspecified site: Secondary | ICD-10-CM | POA: Diagnosis not present

## 2024-03-30 DIAGNOSIS — Z9181 History of falling: Secondary | ICD-10-CM | POA: Diagnosis not present

## 2024-03-30 DIAGNOSIS — E1122 Type 2 diabetes mellitus with diabetic chronic kidney disease: Secondary | ICD-10-CM | POA: Diagnosis not present

## 2024-03-30 DIAGNOSIS — H9113 Presbycusis, bilateral: Secondary | ICD-10-CM | POA: Diagnosis not present

## 2024-03-30 DIAGNOSIS — I129 Hypertensive chronic kidney disease with stage 1 through stage 4 chronic kidney disease, or unspecified chronic kidney disease: Secondary | ICD-10-CM | POA: Diagnosis not present

## 2024-03-30 DIAGNOSIS — R413 Other amnesia: Secondary | ICD-10-CM | POA: Diagnosis not present

## 2024-03-30 DIAGNOSIS — M1712 Unilateral primary osteoarthritis, left knee: Secondary | ICD-10-CM | POA: Diagnosis not present

## 2024-04-04 DIAGNOSIS — M1712 Unilateral primary osteoarthritis, left knee: Secondary | ICD-10-CM | POA: Diagnosis not present

## 2024-04-04 DIAGNOSIS — E782 Mixed hyperlipidemia: Secondary | ICD-10-CM | POA: Diagnosis not present

## 2024-04-04 DIAGNOSIS — Z9181 History of falling: Secondary | ICD-10-CM | POA: Diagnosis not present

## 2024-04-04 DIAGNOSIS — N182 Chronic kidney disease, stage 2 (mild): Secondary | ICD-10-CM | POA: Diagnosis not present

## 2024-04-04 DIAGNOSIS — I129 Hypertensive chronic kidney disease with stage 1 through stage 4 chronic kidney disease, or unspecified chronic kidney disease: Secondary | ICD-10-CM | POA: Diagnosis not present

## 2024-04-04 DIAGNOSIS — H9113 Presbycusis, bilateral: Secondary | ICD-10-CM | POA: Diagnosis not present

## 2024-04-04 DIAGNOSIS — E1122 Type 2 diabetes mellitus with diabetic chronic kidney disease: Secondary | ICD-10-CM | POA: Diagnosis not present

## 2024-04-04 DIAGNOSIS — M1000 Idiopathic gout, unspecified site: Secondary | ICD-10-CM | POA: Diagnosis not present

## 2024-04-04 DIAGNOSIS — R413 Other amnesia: Secondary | ICD-10-CM | POA: Diagnosis not present

## 2024-04-06 DIAGNOSIS — H7102 Cholesteatoma of attic, left ear: Secondary | ICD-10-CM | POA: Diagnosis not present

## 2024-04-06 DIAGNOSIS — H6123 Impacted cerumen, bilateral: Secondary | ICD-10-CM | POA: Diagnosis not present

## 2024-05-16 ENCOUNTER — Other Ambulatory Visit: Payer: Self-pay | Admitting: Internal Medicine

## 2024-05-16 DIAGNOSIS — I10 Essential (primary) hypertension: Secondary | ICD-10-CM

## 2024-05-18 DIAGNOSIS — H6123 Impacted cerumen, bilateral: Secondary | ICD-10-CM | POA: Diagnosis not present

## 2024-05-18 DIAGNOSIS — H7102 Cholesteatoma of attic, left ear: Secondary | ICD-10-CM | POA: Diagnosis not present

## 2024-06-02 ENCOUNTER — Other Ambulatory Visit: Payer: Self-pay | Admitting: Internal Medicine

## 2024-06-02 DIAGNOSIS — I10 Essential (primary) hypertension: Secondary | ICD-10-CM

## 2024-06-29 DIAGNOSIS — H7102 Cholesteatoma of attic, left ear: Secondary | ICD-10-CM | POA: Diagnosis not present

## 2024-06-29 DIAGNOSIS — H6123 Impacted cerumen, bilateral: Secondary | ICD-10-CM | POA: Diagnosis not present

## 2024-07-23 ENCOUNTER — Emergency Department

## 2024-07-23 ENCOUNTER — Encounter: Payer: Self-pay | Admitting: Internal Medicine

## 2024-07-23 ENCOUNTER — Other Ambulatory Visit: Payer: Self-pay

## 2024-07-23 ENCOUNTER — Inpatient Hospital Stay
Admission: EM | Admit: 2024-07-23 | Discharge: 2024-07-31 | DRG: 480 | Disposition: A | Discharge status: Skilled Nursing Facility | Attending: Internal Medicine | Admitting: Internal Medicine

## 2024-07-23 DIAGNOSIS — Z602 Problems related to living alone: Secondary | ICD-10-CM | POA: Diagnosis present

## 2024-07-23 DIAGNOSIS — D62 Acute posthemorrhagic anemia: Secondary | ICD-10-CM | POA: Diagnosis not present

## 2024-07-23 DIAGNOSIS — R55 Syncope and collapse: Secondary | ICD-10-CM | POA: Diagnosis not present

## 2024-07-23 DIAGNOSIS — I700 Atherosclerosis of aorta: Secondary | ICD-10-CM | POA: Diagnosis not present

## 2024-07-23 DIAGNOSIS — R918 Other nonspecific abnormal finding of lung field: Secondary | ICD-10-CM | POA: Diagnosis not present

## 2024-07-23 DIAGNOSIS — M62838 Other muscle spasm: Secondary | ICD-10-CM | POA: Diagnosis present

## 2024-07-23 DIAGNOSIS — Z6830 Body mass index (BMI) 30.0-30.9, adult: Secondary | ICD-10-CM | POA: Diagnosis not present

## 2024-07-23 DIAGNOSIS — S72402A Unspecified fracture of lower end of left femur, initial encounter for closed fracture: Secondary | ICD-10-CM | POA: Diagnosis not present

## 2024-07-23 DIAGNOSIS — Z1152 Encounter for screening for COVID-19: Secondary | ICD-10-CM

## 2024-07-23 DIAGNOSIS — Z833 Family history of diabetes mellitus: Secondary | ICD-10-CM | POA: Diagnosis not present

## 2024-07-23 DIAGNOSIS — D72829 Elevated white blood cell count, unspecified: Secondary | ICD-10-CM | POA: Diagnosis not present

## 2024-07-23 DIAGNOSIS — R262 Difficulty in walking, not elsewhere classified: Secondary | ICD-10-CM | POA: Diagnosis not present

## 2024-07-23 DIAGNOSIS — I129 Hypertensive chronic kidney disease with stage 1 through stage 4 chronic kidney disease, or unspecified chronic kidney disease: Secondary | ICD-10-CM | POA: Diagnosis not present

## 2024-07-23 DIAGNOSIS — H9190 Unspecified hearing loss, unspecified ear: Secondary | ICD-10-CM | POA: Diagnosis present

## 2024-07-23 DIAGNOSIS — E1122 Type 2 diabetes mellitus with diabetic chronic kidney disease: Secondary | ICD-10-CM | POA: Diagnosis present

## 2024-07-23 DIAGNOSIS — N1831 Chronic kidney disease, stage 3a: Secondary | ICD-10-CM | POA: Diagnosis not present

## 2024-07-23 DIAGNOSIS — W19XXXA Unspecified fall, initial encounter: Secondary | ICD-10-CM | POA: Diagnosis not present

## 2024-07-23 DIAGNOSIS — S72002A Fracture of unspecified part of neck of left femur, initial encounter for closed fracture: Secondary | ICD-10-CM | POA: Diagnosis not present

## 2024-07-23 DIAGNOSIS — E1129 Type 2 diabetes mellitus with other diabetic kidney complication: Secondary | ICD-10-CM | POA: Diagnosis not present

## 2024-07-23 DIAGNOSIS — Z751 Person awaiting admission to adequate facility elsewhere: Secondary | ICD-10-CM

## 2024-07-23 DIAGNOSIS — E782 Mixed hyperlipidemia: Secondary | ICD-10-CM | POA: Diagnosis present

## 2024-07-23 DIAGNOSIS — Z79899 Other long term (current) drug therapy: Secondary | ICD-10-CM | POA: Diagnosis not present

## 2024-07-23 DIAGNOSIS — S7222XA Displaced subtrochanteric fracture of left femur, initial encounter for closed fracture: Secondary | ICD-10-CM | POA: Diagnosis not present

## 2024-07-23 DIAGNOSIS — Z7401 Bed confinement status: Secondary | ICD-10-CM | POA: Diagnosis not present

## 2024-07-23 DIAGNOSIS — M6281 Muscle weakness (generalized): Secondary | ICD-10-CM | POA: Diagnosis not present

## 2024-07-23 DIAGNOSIS — M1000 Idiopathic gout, unspecified site: Secondary | ICD-10-CM | POA: Diagnosis present

## 2024-07-23 DIAGNOSIS — K5730 Diverticulosis of large intestine without perforation or abscess without bleeding: Secondary | ICD-10-CM | POA: Diagnosis not present

## 2024-07-23 DIAGNOSIS — R748 Abnormal levels of other serum enzymes: Secondary | ICD-10-CM | POA: Diagnosis not present

## 2024-07-23 DIAGNOSIS — J9601 Acute respiratory failure with hypoxia: Secondary | ICD-10-CM

## 2024-07-23 DIAGNOSIS — M5031 Other cervical disc degeneration,  high cervical region: Secondary | ICD-10-CM | POA: Diagnosis not present

## 2024-07-23 DIAGNOSIS — M25552 Pain in left hip: Secondary | ICD-10-CM | POA: Diagnosis not present

## 2024-07-23 DIAGNOSIS — Z66 Do not resuscitate: Secondary | ICD-10-CM | POA: Diagnosis not present

## 2024-07-23 DIAGNOSIS — I959 Hypotension, unspecified: Secondary | ICD-10-CM | POA: Diagnosis not present

## 2024-07-23 DIAGNOSIS — J189 Pneumonia, unspecified organism: Secondary | ICD-10-CM | POA: Diagnosis present

## 2024-07-23 DIAGNOSIS — M170 Bilateral primary osteoarthritis of knee: Secondary | ICD-10-CM | POA: Diagnosis present

## 2024-07-23 DIAGNOSIS — R41841 Cognitive communication deficit: Secondary | ICD-10-CM | POA: Diagnosis not present

## 2024-07-23 DIAGNOSIS — R0902 Hypoxemia: Secondary | ICD-10-CM | POA: Diagnosis present

## 2024-07-23 DIAGNOSIS — R269 Unspecified abnormalities of gait and mobility: Secondary | ICD-10-CM | POA: Diagnosis not present

## 2024-07-23 DIAGNOSIS — E66811 Obesity, class 1: Secondary | ICD-10-CM | POA: Diagnosis present

## 2024-07-23 DIAGNOSIS — J9811 Atelectasis: Secondary | ICD-10-CM | POA: Diagnosis present

## 2024-07-23 DIAGNOSIS — S32592D Other specified fracture of left pubis, subsequent encounter for fracture with routine healing: Secondary | ICD-10-CM | POA: Diagnosis not present

## 2024-07-23 DIAGNOSIS — E669 Obesity, unspecified: Secondary | ICD-10-CM | POA: Diagnosis present

## 2024-07-23 DIAGNOSIS — D649 Anemia, unspecified: Secondary | ICD-10-CM | POA: Diagnosis not present

## 2024-07-23 DIAGNOSIS — S32591D Other specified fracture of right pubis, subsequent encounter for fracture with routine healing: Secondary | ICD-10-CM | POA: Diagnosis not present

## 2024-07-23 DIAGNOSIS — R413 Other amnesia: Secondary | ICD-10-CM | POA: Diagnosis present

## 2024-07-23 DIAGNOSIS — Z9181 History of falling: Secondary | ICD-10-CM | POA: Diagnosis not present

## 2024-07-23 DIAGNOSIS — J984 Other disorders of lung: Secondary | ICD-10-CM | POA: Diagnosis not present

## 2024-07-23 DIAGNOSIS — M25562 Pain in left knee: Secondary | ICD-10-CM | POA: Diagnosis not present

## 2024-07-23 DIAGNOSIS — Y92009 Unspecified place in unspecified non-institutional (private) residence as the place of occurrence of the external cause: Secondary | ICD-10-CM | POA: Diagnosis not present

## 2024-07-23 DIAGNOSIS — S72142A Displaced intertrochanteric fracture of left femur, initial encounter for closed fracture: Principal | ICD-10-CM

## 2024-07-23 DIAGNOSIS — F03A0 Unspecified dementia, mild, without behavioral disturbance, psychotic disturbance, mood disturbance, and anxiety: Secondary | ICD-10-CM | POA: Diagnosis present

## 2024-07-23 DIAGNOSIS — H9113 Presbycusis, bilateral: Secondary | ICD-10-CM | POA: Diagnosis not present

## 2024-07-23 DIAGNOSIS — E041 Nontoxic single thyroid nodule: Secondary | ICD-10-CM | POA: Diagnosis not present

## 2024-07-23 DIAGNOSIS — S199XXA Unspecified injury of neck, initial encounter: Secondary | ICD-10-CM | POA: Diagnosis not present

## 2024-07-23 DIAGNOSIS — I10 Essential (primary) hypertension: Secondary | ICD-10-CM | POA: Diagnosis not present

## 2024-07-23 DIAGNOSIS — R0989 Other specified symptoms and signs involving the circulatory and respiratory systems: Secondary | ICD-10-CM | POA: Diagnosis not present

## 2024-07-23 DIAGNOSIS — M25561 Pain in right knee: Secondary | ICD-10-CM | POA: Diagnosis not present

## 2024-07-23 DIAGNOSIS — R079 Chest pain, unspecified: Secondary | ICD-10-CM | POA: Diagnosis not present

## 2024-07-23 DIAGNOSIS — J439 Emphysema, unspecified: Secondary | ICD-10-CM | POA: Diagnosis not present

## 2024-07-23 DIAGNOSIS — N179 Acute kidney failure, unspecified: Secondary | ICD-10-CM | POA: Diagnosis not present

## 2024-07-23 DIAGNOSIS — I6782 Cerebral ischemia: Secondary | ICD-10-CM | POA: Diagnosis not present

## 2024-07-23 DIAGNOSIS — K449 Diaphragmatic hernia without obstruction or gangrene: Secondary | ICD-10-CM | POA: Diagnosis not present

## 2024-07-23 DIAGNOSIS — S72142D Displaced intertrochanteric fracture of left femur, subsequent encounter for closed fracture with routine healing: Secondary | ICD-10-CM | POA: Diagnosis not present

## 2024-07-23 DIAGNOSIS — T1490XA Injury, unspecified, initial encounter: Secondary | ICD-10-CM | POA: Diagnosis not present

## 2024-07-23 DIAGNOSIS — M79606 Pain in leg, unspecified: Secondary | ICD-10-CM | POA: Diagnosis not present

## 2024-07-23 LAB — COMPREHENSIVE METABOLIC PANEL WITH GFR
ALT: 16 U/L (ref 0–44)
AST: 34 U/L (ref 15–41)
Albumin: 3.3 g/dL — ABNORMAL LOW (ref 3.5–5.0)
Alkaline Phosphatase: 79 U/L (ref 38–126)
Anion gap: 15 (ref 5–15)
BUN: 25 mg/dL — ABNORMAL HIGH (ref 8–23)
CO2: 24 mmol/L (ref 22–32)
Calcium: 9.5 mg/dL (ref 8.9–10.3)
Chloride: 97 mmol/L — ABNORMAL LOW (ref 98–111)
Creatinine, Ser: 1.2 mg/dL — ABNORMAL HIGH (ref 0.44–1.00)
GFR, Estimated: 42 mL/min — ABNORMAL LOW (ref >60)
Glucose, Bld: 165 mg/dL — ABNORMAL HIGH (ref 70–99)
Potassium: 4.5 mmol/L (ref 3.5–5.1)
Sodium: 136 mmol/L (ref 135–145)
Total Bilirubin: 0.8 mg/dL (ref 0.0–1.2)
Total Protein: 7.2 g/dL (ref 6.5–8.1)

## 2024-07-23 LAB — CBC WITH DIFFERENTIAL/PLATELET
Abs Immature Granulocytes: 0.04 K/uL (ref 0.00–0.07)
Basophils Absolute: 0 K/uL (ref 0.0–0.1)
Basophils Relative: 0 %
Eosinophils Absolute: 0 K/uL (ref 0.0–0.5)
Eosinophils Relative: 0 %
HCT: 41.2 % (ref 36.0–46.0)
Hemoglobin: 13 g/dL (ref 12.0–15.0)
Immature Granulocytes: 0 %
Lymphocytes Relative: 8 %
Lymphs Abs: 0.9 K/uL (ref 0.7–4.0)
MCH: 29 pg (ref 26.0–34.0)
MCHC: 31.6 g/dL (ref 30.0–36.0)
MCV: 92 fL (ref 80.0–100.0)
Monocytes Absolute: 0.6 K/uL (ref 0.1–1.0)
Monocytes Relative: 5 %
Neutro Abs: 10.2 K/uL — ABNORMAL HIGH (ref 1.7–7.7)
Neutrophils Relative %: 87 %
Platelets: 221 K/uL (ref 150–400)
RBC: 4.48 MIL/uL (ref 3.87–5.11)
RDW: 14.1 % (ref 11.5–15.5)
WBC: 11.9 K/uL — ABNORMAL HIGH (ref 4.0–10.5)
nRBC: 0 % (ref 0.0–0.2)

## 2024-07-23 LAB — RESP PANEL BY RT-PCR (RSV, FLU A&B, COVID)  RVPGX2
Influenza A by PCR: NEGATIVE
Influenza B by PCR: NEGATIVE
Resp Syncytial Virus by PCR: NEGATIVE
SARS Coronavirus 2 by RT PCR: NEGATIVE

## 2024-07-23 LAB — PROTIME-INR
INR: 1.2 (ref 0.8–1.2)
Prothrombin Time: 15.5 s — ABNORMAL HIGH (ref 11.4–15.2)

## 2024-07-23 LAB — BRAIN NATRIURETIC PEPTIDE: B Natriuretic Peptide: 104.8 pg/mL — ABNORMAL HIGH (ref 0.0–100.0)

## 2024-07-23 LAB — PROCALCITONIN: Procalcitonin: <0.1 ng/mL

## 2024-07-23 LAB — CBG MONITORING, ED: Glucose-Capillary: 100 mg/dL — ABNORMAL HIGH (ref 70–99)

## 2024-07-23 LAB — CK: Total CK: 432 U/L — ABNORMAL HIGH (ref 38–234)

## 2024-07-23 LAB — TROPONIN I (HIGH SENSITIVITY): Troponin I (High Sensitivity): 13 ng/L (ref <18)

## 2024-07-23 LAB — APTT: aPTT: 29 s (ref 24–36)

## 2024-07-23 MED ORDER — IOHEXOL 350 MG/ML SOLN
75.0000 mL | Freq: Once | INTRAVENOUS | Status: AC | PRN
  Administered 2024-07-23: 75 mL via INTRAVENOUS

## 2024-07-23 MED ORDER — METHOCARBAMOL 500 MG PO TABS
500.0000 mg | ORAL_TABLET | Freq: Three times a day (TID) | ORAL | Status: DC | PRN
  Administered 2024-07-25 – 2024-07-28 (×3): 500 mg via ORAL
  Filled 2024-07-23 (×2): qty 1

## 2024-07-23 MED ORDER — FENTANYL CITRATE PF 50 MCG/ML IJ SOSY
25.0000 ug | PREFILLED_SYRINGE | Freq: Once | INTRAMUSCULAR | Status: AC
  Administered 2024-07-23: 25 ug via INTRAVENOUS
  Filled 2024-07-23: qty 1

## 2024-07-23 MED ORDER — LISINOPRIL 5 MG PO TABS
5.0000 mg | ORAL_TABLET | Freq: Every day | ORAL | Status: DC
  Administered 2024-07-24 – 2024-07-31 (×9): 5 mg via ORAL
  Filled 2024-07-23 (×8): qty 1

## 2024-07-23 MED ORDER — ACETAMINOPHEN 325 MG PO TABS
650.0000 mg | ORAL_TABLET | Freq: Four times a day (QID) | ORAL | Status: DC | PRN
  Administered 2024-07-25 – 2024-07-30 (×6): 650 mg via ORAL
  Filled 2024-07-23 (×5): qty 2

## 2024-07-23 MED ORDER — SODIUM CHLORIDE 0.9 % IV BOLUS
500.0000 mL | Freq: Once | INTRAVENOUS | Status: AC
  Administered 2024-07-23: 500 mL via INTRAVENOUS

## 2024-07-23 MED ORDER — ONDANSETRON HCL 4 MG/2ML IJ SOLN: 4.0000 mg | Freq: Three times a day (TID) | INTRAMUSCULAR | Status: DC | PRN

## 2024-07-23 MED ORDER — LIDOCAINE 5 % EX PTCH
1.0000 | MEDICATED_PATCH | CUTANEOUS | Status: DC
  Administered 2024-07-23: 1 via TRANSDERMAL
  Filled 2024-07-23: qty 1

## 2024-07-23 MED ORDER — DONEPEZIL HCL 5 MG PO TABS
10.0000 mg | ORAL_TABLET | Freq: Every day | ORAL | Status: DC
  Administered 2024-07-24 – 2024-07-30 (×12): 10 mg via ORAL
  Filled 2024-07-23 (×8): qty 2

## 2024-07-23 MED ORDER — HYDRALAZINE HCL 20 MG/ML IJ SOLN: 5.0000 mg | INTRAMUSCULAR | Status: DC | PRN

## 2024-07-23 MED ORDER — DM-GUAIFENESIN ER 30-600 MG PO TB12: 1.0000 | ORAL_TABLET | Freq: Two times a day (BID) | ORAL | Status: DC | PRN

## 2024-07-23 MED ORDER — LORAZEPAM 2 MG/ML IJ SOLN: 2.0000 mg | INTRAMUSCULAR | Status: DC | PRN

## 2024-07-23 MED ORDER — AZITHROMYCIN 250 MG PO TABS
250.0000 mg | ORAL_TABLET | Freq: Every day | ORAL | Status: AC
  Administered 2024-07-24 – 2024-07-27 (×7): 250 mg via ORAL
  Filled 2024-07-23 (×4): qty 1

## 2024-07-23 MED ORDER — MORPHINE SULFATE (PF) 2 MG/ML IV SOLN: 0.5000 mg | INTRAVENOUS | Status: DC | PRN

## 2024-07-23 MED ORDER — FUROSEMIDE 20 MG PO TABS
20.0000 mg | ORAL_TABLET | Freq: Every day | ORAL | Status: DC
  Administered 2024-07-24 – 2024-07-31 (×9): 20 mg via ORAL
  Filled 2024-07-23 (×8): qty 1

## 2024-07-23 MED ORDER — AZITHROMYCIN 500 MG PO TABS
500.0000 mg | ORAL_TABLET | Freq: Every day | ORAL | Status: AC
  Administered 2024-07-23: 500 mg via ORAL
  Filled 2024-07-23: qty 1

## 2024-07-23 MED ORDER — ATENOLOL 25 MG PO TABS
25.0000 mg | ORAL_TABLET | Freq: Every day | ORAL | Status: DC
  Administered 2024-07-24 – 2024-07-31 (×9): 25 mg via ORAL
  Filled 2024-07-23 (×8): qty 1

## 2024-07-23 MED ORDER — ALBUTEROL SULFATE (2.5 MG/3ML) 0.083% IN NEBU: 2.5000 mg | INHALATION_SOLUTION | RESPIRATORY_TRACT | Status: DC | PRN

## 2024-07-23 MED ORDER — SENNOSIDES-DOCUSATE SODIUM 8.6-50 MG PO TABS
1.0000 | ORAL_TABLET | Freq: Every evening | ORAL | Status: DC | PRN
Start: 2024-07-23 — End: 2024-07-31
  Administered 2024-07-27 – 2024-07-28 (×2): 1 via ORAL
  Filled 2024-07-23 (×2): qty 1

## 2024-07-23 MED ORDER — ALLOPURINOL 100 MG PO TABS
100.0000 mg | ORAL_TABLET | Freq: Every day | ORAL | Status: DC
  Administered 2024-07-24 – 2024-07-31 (×11): 100 mg via ORAL
  Filled 2024-07-23 (×8): qty 1

## 2024-07-23 MED ORDER — OXYCODONE-ACETAMINOPHEN 5-325 MG PO TABS
1.0000 | ORAL_TABLET | ORAL | Status: DC | PRN
  Administered 2024-07-28 – 2024-07-30 (×3): 1 via ORAL
  Filled 2024-07-23 (×3): qty 1

## 2024-07-23 MED ORDER — FENTANYL CITRATE PF 50 MCG/ML IJ SOSY
25.0000 ug | PREFILLED_SYRINGE | Freq: Once | INTRAMUSCULAR | Status: AC | PRN
  Administered 2024-07-23: 25 ug via INTRAVENOUS
  Filled 2024-07-23: qty 1

## 2024-07-23 NOTE — ED Notes (Signed)
 This RN attempted to start IV x2 without success. IV team consulted

## 2024-07-23 NOTE — ED Notes (Signed)
 Pt placed on bedpan with help from family and this RN. Pt able to void, peri care provided. Pt covered with blankets.

## 2024-07-23 NOTE — ED Notes (Signed)
 Advised nurse that patient has ready bed

## 2024-07-23 NOTE — ED Triage Notes (Signed)
 Pt BIB [facility] from home for an unwitnessed fall this morning. Pt was found on floor by bed soaked in urine by family. Family states no known blood thinners. Pt does live alone EMS reports no LOC. Pt does have Dementia and per family/EMS states is at baseline. Pt is very hard of hearing and does not have hearing aids with her. Pt stating left hip/leg pain. EMS reports some shortening of left leg. vitals wnl, 144 CBG

## 2024-07-23 NOTE — ED Notes (Signed)
 XR and Resp Therapy at bedside.

## 2024-07-23 NOTE — ED Provider Notes (Signed)
 [facility] Provider Note    Event Date/Time   First MD Initiated Contact with Patient 07/23/24 1057     (approximate)   History   Fall and Hip Pain   HPI  Krisanne Lich is a 89 year old female with history of T2DM, HTN, CKD presenting to the emergency department for evaluation after an unwitnessed fall.  Patient significantly hearing impaired, history primarily provided by patient's son.  He notes that the patient has a history of mild dementia, but lives by herself at baseline.  He suspects that while she was getting ready for church this morning, she fell and was unable to get off the ground.  Unknown LOC or head strike.  Patient denies chest pain, shortness of breath.  Complaining of left hip pain.  Possibly on the ground for a few hours.  Son reports that he was able to lift his mom into a chair, but she has not been able to bear weight.  He also notes that she had multiple brief syncopal episodes and seemed to have some labored respirations when he first found her.  Had urinated on herself.  Was noted to have some shortening of her leg with EMS.     Physical Exam   Triage Vital Signs: ED Triage Vitals  Encounter Vitals Group     BP 07/23/24 1101 (!) 139/98     Girls Systolic BP Percentile --      Girls Diastolic BP Percentile --      Boys Systolic BP Percentile --      Boys Diastolic BP Percentile --      Pulse Rate 07/23/24 1116 81     Resp 07/23/24 1101 18     Temp 07/23/24 1101 (!) 97.5 F (36.4 C)     Temp Source 07/23/24 1101 Oral     SpO2 07/23/24 1116 (!) 85 %     Weight 07/23/24 1106 172 lb 6.4 oz (78.2 kg)     Height 07/23/24 1106 5' 3 (1.6 m)     Head Circumference --      Peak Flow --      Pain Score --      Pain Loc --      Pain Education --      Exclude from Growth Chart --     Most recent vital signs: Vitals:   07/23/24 1730 07/23/24 1800  BP: (!) 110/93 101/84  Pulse: 71 82  Resp: 18 (!) 22  Temp:    SpO2: 99% 100%     Nursing notes and vital signs reviewed.  General: Adult female, laying in bed, awake interactive Head: Atraumatic Neck: No appreciable tenderness Chest: Symmetric chest rise, no tenderness to palpation.  Cardiac: Regular rhythm and rate.  Respiratory: Lungs clear to auscultation Abdomen: Soft, nondistended. No tenderness to palpation.  Pelvis: Stable in AP and lateral compression.  Tenderness palpation along the left hip. MSK: Left leg is shortened and externally rotated.  DP pulse confirmed via Doppler.  There is tenderness along the proximal femur.  Otherwise no deformity of the right lower and bilateral upper extremities. Neuro: Alert, possible mild confusion but difficult to assess given significant hearing impairment. Skin: No evidence of burns or lacerations.  ED Results / Procedures / Treatments   Labs (all labs ordered are listed, but only abnormal results are displayed) Labs Reviewed  CBC WITH DIFFERENTIAL/PLATELET - Abnormal; Notable for the following components:      Result Value   WBC 11.9 (*)  Neutro Abs 10.2 (*)    All other components within normal limits  CK - Abnormal; Notable for the following components:   Total CK 432 (*)    All other components within normal limits  COMPREHENSIVE METABOLIC PANEL WITH GFR - Abnormal; Notable for the following components:   Chloride 97 (*)    Glucose, Bld 165 (*)    BUN 25 (*)    Creatinine, Ser 1.20 (*)    Albumin 3.3 (*)    GFR, Estimated 42 (*)    All other components within normal limits  PROTIME-INR - Abnormal; Notable for the following components:   Prothrombin Time 15.5 (*)    All other components within normal limits  BRAIN NATRIURETIC PEPTIDE - Abnormal; Notable for the following components:   B Natriuretic Peptide 104.8 (*)    All other components within normal limits  CBG MONITORING, ED - Abnormal; Notable for the following components:   Glucose-Capillary 100 (*)    All other components within normal  limits  RESP PANEL BY RT-PCR (RSV, FLU A&B, COVID)  RVPGX2  URINALYSIS, W/ REFLEX TO CULTURE (INFECTION SUSPECTED)  CBC  BASIC METABOLIC PANEL WITH GFR  APTT  TYPE AND SCREEN  TROPONIN I (HIGH SENSITIVITY)     EKG EKG independently reviewed and interpreted by myself demonstrates:  EKG demonstrates sinus rhythm at a rate of 83, PR 144, QRS 78, QTc 467, no acute ST changes  RADIOLOGY Imaging independently reviewed and interpreted by myself demonstrates:  CXR with possible left airspace disease Hip x-Kaela Beitz with left intertrochanteric femur fracture  Formal Radiology Read:  CT Cervical Spine Wo Contrast Result Date: 07/23/2024 CLINICAL DATA:  Neck trauma (Age >= 34y) 89 year old post unwitnessed fall. EXAM: CT CERVICAL SPINE WITHOUT CONTRAST TECHNIQUE: Multidetector CT imaging of the cervical spine was performed without intravenous contrast. Multiplanar CT image reconstructions were also generated. RADIATION DOSE REDUCTION: This exam was performed according to the departmental dose-optimization program which includes automated exposure control, adjustment of the mA and/or kV according to patient size and/or use of iterative reconstruction technique. COMPARISON:  None available. FINDINGS: Alignment: No traumatic subluxation. Grade 1 anterolisthesis of C3 on C4 and C7 on T1 and retrolisthesis of C5 on C6 is likely degenerative. Skull base and vertebrae: No evidence of acute fracture. The dens and skull base are intact. Soft tissues and spinal canal: No prevertebral fluid or swelling. No visible canal hematoma. Disc levels: Advanced degenerative disc disease with near complete disc space loss from C3-C4 through C5-C6. Advanced multilevel facet hypertrophy. Upper chest: Assessed completely on concurrent chest CT, reported separately. No apical pneumothorax. Other: None. IMPRESSION: 1. No acute fracture or traumatic subluxation of the cervical spine. 2. Advanced multilevel degenerative disc disease and  facet hypertrophy. Electronically Signed   By: Andrea Gasman M.D.   On: 07/23/2024 15:27   CT Head Wo Contrast Result Date: 07/23/2024 CLINICAL DATA:  89 year old post unwitnessed fall.  Found on floor. EXAM: CT HEAD WITHOUT CONTRAST TECHNIQUE: Contiguous axial images were obtained from the base of the skull through the vertex without intravenous contrast. RADIATION DOSE REDUCTION: This exam was performed according to the departmental dose-optimization program which includes automated exposure control, adjustment of the mA and/or kV according to patient size and/or use of iterative reconstruction technique. COMPARISON:  None Available. FINDINGS: Brain: No intracranial hemorrhage, mass effect, or midline shift. Age related atrophy. No hydrocephalus. The basilar cisterns are patent. Moderate periventricular and deep white matter hypodensity typical of chronic small vessel ischemia. Punctate remote  lacunar infarct in the left basal ganglia. No evidence of territorial infarct or acute ischemia. No extra-axial or intracranial fluid collection. Vascular: Atherosclerosis of skullbase vasculature without hyperdense vessel or abnormal calcification. Skull: No fracture or focal lesion. Sinuses/Orbits: Complete opacification of left mastoid air cells. Mucous retention cyst in the right maxillary sinus. Bilateral cataract resection. Other: No confluent scalp hematoma. IMPRESSION: 1. No acute intracranial abnormality. No skull fracture. 2. Age related atrophy and chronic small vessel ischemia. 3. Opacification of left mastoid air cells. Electronically Signed   By: Andrea Gasman M.D.   On: 07/23/2024 15:21   CT ABDOMEN PELVIS W CONTRAST Result Date: 07/23/2024 CLINICAL DATA:  Acute abdominal pain, fall. EXAM: CT ABDOMEN AND PELVIS WITH CONTRAST TECHNIQUE: Multidetector CT imaging of the abdomen and pelvis was performed using the standard protocol following bolus administration of intravenous contrast. RADIATION DOSE  REDUCTION: This exam was performed according to the departmental dose-optimization program which includes automated exposure control, adjustment of the mA and/or kV according to patient size and/or use of iterative reconstruction technique. CONTRAST:  75mL OMNIPAQUE  IOHEXOL  350 MG/ML SOLN COMPARISON:  None Available. FINDINGS: Lower chest: No acute abnormality. Hepatobiliary: There is a subcentimeter hypodensity in the left lobe of the liver which is too small to characterize, likely a cyst. Otherwise, the liver, gallbladder and bile ducts are within normal limits. Pancreas: Unremarkable. No pancreatic ductal dilatation or surrounding inflammatory changes. Spleen: Normal in size without focal abnormality. Adrenals/Urinary Tract: There is a 15 mm cyst in the left kidney. Otherwise, the kidneys, adrenal glands and bladder are within normal limits. Stomach/Bowel: No evidence of bowel wall thickening, distention, or inflammatory changes. The appendix is not seen. There are scattered colonic diverticula. A small hiatal hernia is present. Stomach is otherwise within normal limits. Vascular/Lymphatic: Aortic atherosclerosis. No enlarged abdominal or pelvic lymph nodes. Reproductive: Uterus and bilateral adnexa are unremarkable. Other: No abdominal wall hernia or abnormality. No abdominopelvic ascites. Musculoskeletal: Degenerative changes affect the spine. There is scoliosis of the thoracolumbar spine. There also degenerative changes of the right sacroiliac joint. There are healed/healing right superior and inferior pubic rami fractures. There is an acute comminuted left femoral intratrochanteric fracture. IMPRESSION: 1. Acute comminuted left femoral intratrochanteric fracture. 2. Healed/healing right superior and inferior pubic rami fractures. 3. No acute localizing process in the abdomen or pelvis. 4. Colonic diverticulosis. 5. Small hiatal hernia. Aortic Atherosclerosis (ICD10-I70.0). Electronically Signed   By: Greig Pique M.D.   On: 07/23/2024 15:16   DG Chest Portable 1 View Result Date: 07/23/2024 EXAM: 1 VIEW XRAY OF THE CHEST 07/23/2024 11:48:26 AM COMPARISON: None available. CLINICAL HISTORY: Patient with history of dementia and hypoxia, brought in by EMS after an unwitnessed fall at home. Found on floor by bed soaked in urine, with no known blood thinners and no loss of consciousness reported. Patient complains of left hip/leg pain and EMS reports some shortening of left leg. FINDINGS: LUNGS AND PLEURA: Lung volumes are low. Left basilar airspace disease may represent atelectasis, aspiration, or infection. No pleural effusion. No pneumothorax. HEART AND MEDIASTINUM: No acute abnormality of the cardiac and mediastinal silhouettes. BONES AND SOFT TISSUES: Asymmetric advanced degenerative changes are present in the left shoulder. No acute osseous abnormality. IMPRESSION: 1. Left basilar airspace disease, possibly representing atelectasis, aspiration, or infection. 2. Asymmetric advanced degenerative changes in the left shoulder. Electronically signed by: Lonni Necessary MD 07/23/2024 11:58 AM EDT RP Workstation: [id number]   DG Hip Unilat With Pelvis 2-3 Views Left Result Date: 07/23/2024  EXAM: 2 or 3 VIEW(S) XRAY OF THE PELVIS AND LEFT HIP 07/23/2024 11:47:47 AM COMPARISON: None available. CLINICAL HISTORY: Unwitnessed fall with left hip and leg pain. Patient found on floor soaked in urine. No known blood thinners, no loss of consciousness, and some shortening of left leg reported. FINDINGS: JOINTS: SI joints are symmetric. No acute fracture. Bilateral hips demonstrate normal alignment. SOFT TISSUES: The soft tissues are unremarkable. BONES: A comminuted intertrochanteric fracture is present in the proximal left femur. The femoral head is located. The lesser trochanter is displaced 8 mm. Callus formation about right superior and inferior pubic rami fractures suggests these are more remote and healing. Right  sacral ala healing fractures are suggested. 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 Acute diastasis is considered less likely. IMPRESSION: 1. Comminuted intertrochanteric fracture in the proximal left femur with 8 mm displacement of the lesser trochanter. 2. Callus formation about right superior and inferior pubic rami fractures, suggesting these are more remote and healing. 3. Healing right sacral ala fractures are suggested. 4. 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 Acute diastasis is considered less likely. Electronically signed by: Lonni Necessary MD 07/23/2024 11:55 AM EDT RP Workstation: [id number]    PROCEDURES:  Critical Care performed: No  Procedures   MEDICATIONS ORDERED IN ED: Medications  albuterol  (PROVENTIL ) (2.5 MG/3ML) 0.083% nebulizer solution 2.5 mg (has no administration in time range)  dextromethorphan-guaiFENesin  (MUCINEX  DM) 30-600 MG per 12 hr tablet 1 tablet (has no administration in time range)  ondansetron  (ZOFRAN ) injection 4 mg (has no administration in time range)  hydrALAZINE  (APRESOLINE ) injection 5 mg (has no administration in time range)  acetaminophen  (TYLENOL ) tablet 650 mg (has no administration in time range)  senna-docusate (Senokot-S) tablet 1 tablet (has no administration in time range)  fentaNYL  (SUBLIMAZE ) injection 25 mcg (25 mcg Intravenous Given 07/23/24 1226)  fentaNYL  (SUBLIMAZE ) injection 25 mcg (25 mcg Intravenous Given 07/23/24 1558)  sodium chloride  0.9 % bolus 500 mL (0 mLs Intravenous Stopped 07/23/24 1415)  iohexol  (OMNIPAQUE ) 350 MG/ML injection 75 mL (75 mLs Intravenous Contrast Given 07/23/24 1509)     IMPRESSION / MDM / ASSESSMENT AND PLAN / ED COURSE  I reviewed the triage vital signs and the nursing notes.  Differential diagnosis includes, but is not limited to, intracranial bleed, spine fracture, rib fracture, pneumonia, pneumothorax,  intra-abdominal injury, femur fracture  Patient's presentation is most consistent with acute presentation with potential threat to life or bodily function.  89 year old female presenting after an unwitnessed fall, unclear if syncope present.  Initial difficulty with obtaining pleth with pulse ox, but does appear to be hypoxic on presentation.  Was briefly placed on nonrebreather, but was able to get more accurate pulse ox reading and was able to be weaned down to 4 L.  X-Aymen Widrig with questionable mild opacity, but will obtain CTA of the chest to further evaluate, particularly given report of syncopal episodes as well.  Stable blood pressure.  Will obtain CT head and C-spine as well as CT abdomen pelvis and x-Eulonda Andalon of the left hip.  Clinical Course as of 07/23/24 8084  [place] Jul 23, 2024  1657 CT Angio Chest PE W and/or Wo Contrast Delaying read for CTA of the chest.  [facility] radiology room who reported that they would escalate this to have the radiologist provider read. [NR]  1730 Still awaiting CTA read.  No obvious large central PE on my review.  Orthopedics paged regarding intertrochanteric fracture, anticipate ultimate admission. [NR]  1806 Case discussed with Dr. Maryrose.  He will evaluate patient for likely operative intervention.  CTA still pending, but will go ahead and reach out to hospitalist team for admission. [NR]  1839 Discussed with Dr. Hilma.  He will evaluate for anticipated admission. [NR]    Clinical Course User Index [NR] Levander Slate, MD     FINAL CLINICAL IMPRESSION(S) / ED DIAGNOSES   Final diagnoses:  Closed displaced intertrochanteric fracture of left femur, initial encounter (HCC)  Hypoxia  Fall in home, initial encounter  Elevated CK     Rx / DC Orders   ED Discharge Orders     None        Note:  This document was prepared using Dragon voice recognition software and may include unintentional dictation errors.   Levander Slate, MD 07/23/24 [phone]

## 2024-07-23 NOTE — ED Notes (Signed)
 Family at RN station asking if any word yet, informed this RN has not heard anything at this time. Family stating pt has not eaten today and that is not good for a diabetic.

## 2024-07-23 NOTE — ED Notes (Signed)
 Having hard time getting SPO2 sensor to read. Attempted moving sensor to different, finger/toe/ear/nose without getting good reading. Pt resp even and unlabored. Dr Levander at bedside.

## 2024-07-23 NOTE — Consult Note (Signed)
 ORTHOPEDIC CONSULTATION  [name] Griffin [id number]  07/23/2024  CC:  Chief Complaint  Patient presents with   Fall   Hip Pain    History of Present IlIness: The patient is a 89 y.o. female who got off balance and fell at home landing on her left hip.  She had immediate onset of left hip pain and was unable to ambulate.  She was transported by EMS to the local emergency department where she was evaluated and orthopedics was consulted for a left hip fracture.  The patient's son, Dyanne, is at bedside to assist with history.  The patient lives independently in a single wide trailer and uses a walker to ambulate short distances inside the trailer.  He comes and checks on her every day and brings her groceries etc.  He reports that she does have some dementia and arthritis in both knees that makes her very unsteady on her gait.  He reports she takes no significant blood thinners other than aspirin .  PMH:  Past Medical History:  Diagnosis Date   CKD (chronic kidney disease)    Diabetes mellitus without complication (HCC)    Essential hypertension, benign    Mixed hyperlipidemia     SH: No past surgical history on file.  ALL: No Known Allergies  MED: (Not in a hospital admission)   All home medications have been reviewed as documented in the medication reconciliation portion of the patient record.  FH: No family history on file.  Social:  reports that she has never smoked. She has never used smokeless tobacco. She reports that she does not currently use alcohol. She reports that she does not use drugs.  Review of Systems: General: Denies fever, chills, weight loss Eyes: Denies blurry vision, changes in vision ENT: Denies sore throat, congestions, nosebleeds CV: Denies chest pain, palpitations Respiratory: Denies shortness of breath, wheezing, cough Gl: Denies abdominal pain, nausea, vomiting GU: Denies hematuria Integumentary: Denies rashes or lesions Neuro: Denies headache,  dizziness Psych: Negative Hem/Onc: Denies easy bruising or bleeding disorders Musculoskeletal: See HPI above.  Vitals: BP 101/84   Pulse 82   Temp 98.1 F (36.7 C) (Oral)   Resp (!) 22   Ht 5' 3 (1.6 m)   Wt 78.2 kg   SpO2 100%   BMI 30.54 kg/m    Physical Exam: General: Awake, alert and oriented, no acute distress. Eyes: Pupils reactive, EOMI, normal conjunctiva, no scleral icterus. HENT: Normocephalic, atraumatic, normal hearing, moist oral mucosa Neck: Supple, non-tender, no cervical lymphadenopathy. Lungs: Chest rise is symmetric, non-labored respiration, chest wall nontender to palpation Heart: Normal rate by palpation, normal peripheral perfusion Abdomen: Soft, non-tender, non-distended. Pelvis is stable. Skin: Skin envelope intact, dry and pink, no rashes or lesions, no signs of infection. Neurologic: Awake, alert, and oriented X3 Psychiatric: Cooperative, appropriate mood and affect.  Musculoskeletal: Evaluation of the patient's bilateral upper extremities and right lower extremity reveal no areas of tenderness or limitations to range of motion. Any motion about the patient's left hip is extremely painful. Palpation of the left distal femur, knee, tib-fib region, ankle and foot show no areas of tenderness. The patient's calves are soft and nontender with no palpable cords. Homans testing is negative. Dorsalis pedis and posterior tibial pulse are 2+ and symmetric. The patient's toes are pink and warm with a brisk capillary refill time. The patient is able to gently move their ankles and toes on command. Sensation is intact over all lower extremity dermatomal patterns.   Radiographic  findings: Multiple views of the patient's left hip and pelvis reveal a comminuted displaced intertrochanteric/subtrochanteric proximal femur fracture.  The lesser trochanter is completely displaced.  Bone quality is greatly decreased from normal in keeping with the patient's age of 25.  Minimal  degenerative changes are noted in the left hip joint.  No acute pelvic fractures are noted.  Old right sided superior and inferior pubic rami fractures are noted as well as chronic widening of the symphysis pubis.   Labs:  Recent Labs    07/23/24 1136  HGB 13.0   Recent Labs    07/23/24 1136  WBC 11.9*  RBC 4.48  HCT 41.2  PLT 221   Recent Labs    07/23/24 1221  NA 136  K 4.5  CL 97*  CO2 24  BUN 25*  CREATININE 1.20*  GLUCOSE 165*  CALCIUM 9.5   Recent Labs    07/23/24 1221  INR 1.2     Assessment/Plan:    Assessment: 89 year old female with closed displaced left intertrochanteric/subtrochanteric proximal femur fracture.   Plan: I discussed the #1 reason for surgical stabilization of this fracture in a patient of this age is for pain control.  We discussed that certainly her bone quality is decreased from normal from osteoporosis and screw cut out and catastrophic failure would have to be a concern.  We discussed that currently she is having some low oxygen saturation in the emergency department and is being evaluated by the emergency room physician and the hospitalist will be admitting the patient.  With all that said, the patient's son who is her primary caregiver reports that he would like to proceed with surgery when she is medically stable to repair the left hip fracture.  He understands a significant risk.  We discussed that she may not be able to return to living independently after this injury and may need nursing home placement.  We discussed that I will tentatively place the patient on the operative schedule for a trochanteric nail tomorrow if she is medically cleared and optimized for surgery.  At this point we will keep her n.p.o. after midnight in anticipation of repairing her hip tomorrow.   The patient and her son have been extensively counseled on the potential benefits versus risks of surgery including but not limited to postoperative nausea, wound  healing issues, infection, blood clots, breakage or loosening of any metallic implants or anchors, limb length inequality, dislocation, and possible need for future revision surgery. We also discussed the significant medical risks of stroke, myocardial infarction, anesthetic complications, or exacerbation of their diabetes mellitus. After discussing the multiple pros and cons, they would like to proceed with surgery as discussed. Their consent was reviewed and signed and placed in the chart.   Cordella Lamar Knack M.D. 07/23/2024 7:23 PM

## 2024-07-23 NOTE — ED Notes (Signed)
 Pt placed on NRB.

## 2024-07-23 NOTE — ED Notes (Signed)
 RT able to get SPO2 sensor to read. Pt at 96% on 2L

## 2024-07-23 NOTE — H&P (Signed)
 History and Physical    [name] Griffin FMW:[id number] DOB: 02/09/1928 DOA: 07/23/2024  Referring MD/NP/PA:   PCP: Fernand Fredy RAMAN, MD   Patient coming from:  The patient is coming from home.     Chief Complaint: fall, left hip pain  HPI: [name] Griffin is a 89 y.o. female with medical history significant of HTN, HLD, diet-controlled DM, gout, CKD-3A, memory loss, hard of hearing, who presents with fall, left hip pain.  Per his son at the bedside, the patient lives independently by herself at baseline.  Patient had fall and was found on the floor by her son, not sure if pt had LOC or head strike. Pt has memory loss and dementia.  Her mental status is at baseline today. His son suspects that while she was getting ready for church this morning, she fell and was unable to get off the ground.  Patient complains of left hip pain.  The left leg is shortened.  The pain seems to be severe, aggravated by movement.  Patient does not have nausea, vomiting, diarrhea or abdominal pain.  No symptoms of UTI.  No fever or chills.  Patient denies chest pain, cough, SOB. She was found to have oxygen desaturation to 85% on room air, which initially required NRB, then 4L of oxygen. When I saw pt in ED, she is on 2L of oxygen with 99% of saturation.  She still denies SOB.  Per her son, patient had multiple brief episodes of possible syncope, described as suddenly stopped talking, with confusion and eye staring, which lasted for few seconds then comes back to normal.  No unilateral numbness or tingling in extremities.  No facial droop or slurred speech.    Data reviewed independently and ED Course: pt was found to have BNP 104, WBC 11.9, renal function close to baseline, troponin 13, CK432, temperature normal, blood pressure 123/64, heart rate 70-80s, RR 29--> 19.  CT of head and CT of C-spine negative for acute injury.  CT of abdomen/pelvis negative for acute intracranial abnormalities.  CTA negative for PE, but  showed atelectasis.  X-ray of left hip/pelvis showed: 1. Comminuted intertrochanteric fracture in the proximal left femur with 8 mm displacement of the lesser trochanter. 2. Callus formation about right superior and inferior pubic rami fractures, suggesting these are more remote and healing. 3. Healing right sacral ala fractures are suggested. 4. 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 Acute diastasis is considered less likely.  CXR: 1. Left basilar airspace disease, possibly representing atelectasis, aspiration, or infection. 2. Asymmetric advanced degenerative changes in the left shoulder.   CTA: No evidence of pulmonary emboli.  Atelectatic changes are noted in bases left greater than right similar to that seen on prior chest x-ray.   Left thyroid nodule as described. Given the patient's advanced age no further follow-up is recommended.   Aortic Atherosclerosis (ICD10-I70.0) and Emphysema (ICD10-J43.9).    CT of abdomen/pelvis: 1. Acute comminuted left femoral intratrochanteric fracture. 2. Healed/healing right superior and inferior pubic rami fractures. 3. No acute localizing process in the abdomen or pelvis. 4. Colonic diverticulosis. 5. Small hiatal hernia.   Aortic Atherosclerosis (ICD10-I70.0).     EKG: I have personally reviewed.  Sinus rhythm, QTc 467, LAD, with artificial effects  Review of Systems:   General: no fevers, chills, no body weight gain, has fatigue HEENT: no blurry vision, hearing changes or sore throat Respiratory: no dyspnea, coughing, wheezing CV: no chest pain,  no palpitations GI: no nausea, vomiting, abdominal pain, diarrhea, constipation GU: no dysuria, burning on urination, increased urinary frequency, hematuria  Ext: no leg edema Neuro: no unilateral weakness, numbness, or tingling, no vision change or hearing loss. Has fall. Skin: no rash, no skin tear. MSK: No muscle spasm, no deformity,  no limitation of range of movement in spin. Has left  hip pain Heme: No easy bruising.  Travel history: No recent long distant travel.   Allergy: No Known Allergies  Past Medical History:  Diagnosis Date   CKD (chronic kidney disease)    Diabetes mellitus without complication (HCC)    Essential hypertension, benign    Mixed hyperlipidemia     History reviewed. No pertinent surgical history.  Social History:  reports that she has never smoked. She has never used smokeless tobacco. She reports that she does not currently use alcohol. She reports that she does not use drugs.  Family History:  Family History  Problem Relation Age of Onset   Diabetes Mother      Prior to Admission medications   Medication Sig Start Date End Date Taking? Authorizing Provider  allopurinol  (ZYLOPRIM ) 100 MG tablet TAKE 1 TABLET BY MOUTH EVERY DAY 03/06/24   Fernand Fredy RAMAN, MD  atenolol  (TENORMIN ) 25 MG tablet TAKE 1 TABLET BY MOUTH EVERY DAY 12/16/23   Fernand Fredy RAMAN, MD  donepezil  (ARICEPT ) 10 MG tablet Take 1 tablet (10 mg total) by mouth at bedtime. 01/27/24 01/26/25  Fernand Fredy RAMAN, MD  furosemide  (LASIX ) 20 MG tablet TAKE 1 TABLET BY MOUTH EVERY DAY 06/02/24   Fernand Fredy RAMAN, MD  lisinopril  (ZESTRIL ) 5 MG tablet TAKE 1 TABLET BY MOUTH EVERY DAY 05/16/24   Fernand Fredy RAMAN, MD    Physical Exam: Vitals:   07/23/24 1700 07/23/24 1730 07/23/24 1800 07/23/24 2006  BP: 133/73 (!) 110/93 101/84 135/86  Pulse: 69 71 82 76  Resp: (!) 21 18 (!) 22 17  Temp:    98 F (36.7 C)  TempSrc:    Oral  SpO2:  99% 100% 98%  Weight:      Height:       General: Not in acute distress HEENT:       Eyes: PERRL, EOMI, no jaundice       ENT: No discharge from the ears and nose, no pharynx injection, no tonsillar enlargement.        Neck: No JVD, no bruit, no mass felt. Heme: No neck lymph node enlargement. Cardiac: S1/S2, RRR, No murmurs, No gallops or rubs. Respiratory: No rales, wheezing, rhonchi or rubs. GI: Soft,  nondistended, nontender, no rebound pain, no organomegaly, BS present. GU: No hematuria Ext: No pitting leg edema bilaterally. 1+DP/PT pulse bilaterally. Musculoskeletal: Has left hip tenderness, left leg is shortened  skin: No rashes.  Neuro: Alert, following command, cranial nerves II-XII grossly intact, moves all extremities Psych: Patient is not psychotic, no suicidal or hemocidal ideation.  Labs on Admission: I have personally reviewed following labs and imaging studies  CBC: Recent Labs  Lab 07/23/24 1136  WBC 11.9*  NEUTROABS 10.2*  HGB 13.0  HCT 41.2  MCV 92.0  PLT 221   Basic Metabolic Panel: Recent Labs  Lab 07/23/24 1221  NA 136  K 4.5  CL 97*  CO2 24  GLUCOSE 165*  BUN 25*  CREATININE 1.20*  CALCIUM 9.5   GFR: Estimated Creatinine Clearance: 29 mL/min (A) (by C-G formula based on SCr of 1.2 mg/dL (H)). Liver Function Tests:  Recent Labs  Lab 07/23/24 1221  AST 34  ALT 16  ALKPHOS 79  BILITOT 0.8  PROT 7.2  ALBUMIN 3.3*   No results for input(s): LIPASE, AMYLASE in the last 168 hours. No results for input(s): AMMONIA in the last 168 hours. Coagulation Profile: Recent Labs  Lab 07/23/24 1221  INR 1.2   Cardiac Enzymes: Recent Labs  Lab 07/23/24 1221  CKTOTAL 432*   BNP (last 3 results) No results for input(s): PROBNP in the last 8760 hours. HbA1C: No results for input(s): HGBA1C in the last 72 hours. CBG: Recent Labs  Lab 07/23/24 1845  GLUCAP 100*   Lipid Profile: No results for input(s): CHOL, HDL, LDLCALC, TRIG, CHOLHDL, LDLDIRECT in the last 72 hours. Thyroid Function Tests: No results for input(s): TSH, T4TOTAL, FREET4, T3FREE, THYROIDAB in the last 72 hours. Anemia Panel: No results for input(s): VITAMINB12, FOLATE, FERRITIN, TIBC, IRON, RETICCTPCT in the last 72 hours. Urine analysis: No results found for: COLORURINE, APPEARANCEUR, LABSPEC, PHURINE, GLUCOSEU, HGBUR,  BILIRUBINUR, KETONESUR, PROTEINUR, UROBILINOGEN, NITRITE, LEUKOCYTESUR Sepsis Labs: @LABRCNTIP (procalcitonin:4,lacticidven:4) )No results found for this or any previous visit (from the past 240 hours).   Radiological Exams on Admission:   Assessment/Plan Principal Problem:   Closed left hip fracture (HCC) Active Problems:   Fall at home, initial encounter   Oxygen desaturation   Elevated CK   Essential hypertension, benign   Type II diabetes mellitus with renal manifestations (HCC)   Chronic kidney disease, stage 3a (HCC)   Idiopathic gout   Memory impairment   Syncope   Obesity (BMI 30-39.9)   Assessment and Plan:  Closed left hip fracture and pelvic fracture: X-ray showed  comminuted intertrochanteric fracture in the proximal left femur with 8 mm displacement of the lesser trochanter, and callus formation about right superior and inferior pubic rami fractures, suggesting these are more remote and healing, possible healing right sacral ala fractures. No neurovascular compromise. Orthopedic surgeon, Dr. Maryrose was consulted.   - will admit to tele bed as inpt - Pain control: prn morphine , percocet and tyleno - When necessary Zofran  for nausea - Robaxin  for muscle spasm - Lidoderm  patch for pain - type and cross - INR/PTT  Fall at home, initial encounter -PT/OT when able to (not ordered now)  Oxygen desaturation: Patient is not using oxygen normally, but was found to have oxygen desaturation to 85% initially.  Currently on 2 L oxygen with 96% of saturation.  Patient denies SOB, no respiratory distress.  Etiology is not clear.  Patient is taking low-dose Lasix  20 mg daily, but no history of CHF per her son.  On examination, patient does not have leg edema or JVD.  BNP 104, does not seem to have CHF exacerbation.  Lungs are clear to auscultation, no wheezing or rhonchi or crackles.  Chest x-ray showed possible left basilar airspace disease.  CTA negative for PE, but  showed atelectasis, no clear consolidation.  I suspect her oxygen desaturation may be due to pain medication use.  Patient did have received 2 doses of 25 mg fentanyl  for pain.  Since patient has a mild leukocytosis with WBC 11.9, and a chest x-ray finding for possible left basilar airspace disease, will start Z-Pak empirically.  -Continue nasal cannula oxygen to maintain oxygen saturation above 93% - Z-Pak - Sputum culture if producing sputum - As needed albuterol  and Mucinex  - Follow-up RESP panel to rule out viral infection  Elevated CK: CK mildly elevated 432. -pt received 500 cc normal saline -  Will not continue IV fluid due to oxygen desaturation  Essential hypertension, benign -IV hydralazine  as needed - Continue home lisinopril , Lasix , atenolol   Diet controlled type II diabetes mellitus with renal manifestations (HCC): Recent A1c 6.0, well-controlled.  Blood sugar 165 -Check CBG every morning  Chronic kidney disease, stage 3a (HCC): Renal function slightly worsening at baseline.  Baseline creatinine 1.13 on 07/27/2023.  Her creatinine is 1.28, BUN 25, GFR 4 2 -Follow-up with BMP  Idiopathic gout -Allopurinol   Memory impairment: No behavior disturbance -Fall precaution - Donepezil   Syncope: Per her son, patient had multiple brief episodes of possible syncope, described as suddenly stopped talking, with confusion and eye staring, which lasted for few seconds then comes back to normal.  No unilateral numbness or tingling in extremities.  No facial droop or slurred speech.  Low suspicions for stroke.  CT of head negative for acute intracranial abnormality.  Etiology is not clear.  Potential differential diagnosis include seizure and vasovagal syncope - Seizure precaution - Prn Ativan  for seizure - f/u EEG - Frequent neurocheck - Check vitamin B12 level - May need to discuss with neurology in AM  Obesity (BMI 30-39.9): Patient has Obesity Class I, with body weight 78.2 Kg and  BMI 30.54 kg/m2.  - Encourage losing weight - Exercise and healthy diet   Perioperative Cardiac Risk: pt has multiple comorbidities as listed above. Currently patient is active and lives alone.  Patient has two new issues which require attention, including oxygen desaturation and syncope.  Regarding oxygen desaturation, as discussed above, I suspect this is likely due to pain medication use, though cannot completely rule out mild pneumonia.  Started Z-Pak.  Etiology for syncope episodes is not clear, will follow-up EEG.  Patient does not have focal neurodeficit on physical examination.  Low suspicion for stroke.  No new episode in ED.  Patient does not have chest pain, shortness of breath, palpitation, leg edema.  No signs of acute CHF exacerbation. EKG has no acute change.  Patient's GUPTA score perioperative myocardial infarction or cardaic arrest is 2.0 %. Pt needs to be reevaluated in the morning, if no deterioration for oxygen desaturation or new episode of syncope, I think pt can proceed with surgery.    DVT ppx: SCD  Code Status: DNR per her SON  Family Communication:   Yes, patient's son at bed side.   Disposition Plan: will need rehab  Consults called: Dr. Maryrose of ortho  Admission status and Level of care: Telemetry Medical:  as inpt        Dispo: The patient is from: Home              Anticipated d/c is to: SNF              Anticipated d/c date is: 2 days              Patient currently is not medically stable to d/c.    Severity of Illness:  The appropriate patient status for this patient is INPATIENT. Inpatient status is judged to be reasonable and necessary in order to provide the required intensity of service to ensure the patient's safety. The patient's presenting symptoms, physical exam findings, and initial radiographic and laboratory data in the context of their chronic comorbidities is felt to place them at high risk for further clinical deterioration.  Furthermore, it is not anticipated that the patient will be medically stable for discharge from the hospital within 2 midnights of admission.   * I  certify that at the point of admission it is my clinical judgment that the patient will require inpatient hospital care spanning beyond 2 midnights from the point of admission due to high intensity of service, high risk for further deterioration and high frequency of surveillance required.*       Date of Service 07/23/2024    Caleb Exon [facility]   If 7PM-7AM, please contact night-coverage [email or web address] 07/23/2024, 10:50 PM

## 2024-07-24 ENCOUNTER — Other Ambulatory Visit: Payer: Self-pay

## 2024-07-24 ENCOUNTER — Inpatient Hospital Stay

## 2024-07-24 ENCOUNTER — Inpatient Hospital Stay: Admitting: Anesthesiology

## 2024-07-24 ENCOUNTER — Encounter
Admission: EM | Disposition: A | Discharge status: Skilled Nursing Facility | Payer: Self-pay | Source: Home / Self Care | Attending: Internal Medicine

## 2024-07-24 DIAGNOSIS — S72142A Displaced intertrochanteric fracture of left femur, initial encounter for closed fracture: Secondary | ICD-10-CM

## 2024-07-24 DIAGNOSIS — R55 Syncope and collapse: Secondary | ICD-10-CM | POA: Diagnosis not present

## 2024-07-24 DIAGNOSIS — S7222XA Displaced subtrochanteric fracture of left femur, initial encounter for closed fracture: Principal | ICD-10-CM

## 2024-07-24 DIAGNOSIS — S72002A Fracture of unspecified part of neck of left femur, initial encounter for closed fracture: Secondary | ICD-10-CM | POA: Diagnosis not present

## 2024-07-24 DIAGNOSIS — W19XXXA Unspecified fall, initial encounter: Secondary | ICD-10-CM | POA: Diagnosis not present

## 2024-07-24 DIAGNOSIS — Y92009 Unspecified place in unspecified non-institutional (private) residence as the place of occurrence of the external cause: Secondary | ICD-10-CM | POA: Diagnosis not present

## 2024-07-24 HISTORY — PX: ORIF HIP FRACTURE: SHX2125

## 2024-07-24 LAB — GLUCOSE, CAPILLARY
Glucose-Capillary: 125 mg/dL — ABNORMAL HIGH (ref 70–99)
Glucose-Capillary: 101 mg/dL — ABNORMAL HIGH (ref 70–99)
Glucose-Capillary: 126 mg/dL — ABNORMAL HIGH (ref 70–99)

## 2024-07-24 LAB — BASIC METABOLIC PANEL WITH GFR
Anion gap: 7 (ref 5–15)
BUN: 30 mg/dL — ABNORMAL HIGH (ref 8–23)
CO2: 27 mmol/L (ref 22–32)
Calcium: 8.7 mg/dL — ABNORMAL LOW (ref 8.9–10.3)
Chloride: 104 mmol/L (ref 98–111)
Creatinine, Ser: 1.24 mg/dL — ABNORMAL HIGH (ref 0.44–1.00)
GFR, Estimated: 41 mL/min — ABNORMAL LOW (ref >60)
Glucose, Bld: 119 mg/dL — ABNORMAL HIGH (ref 70–99)
Potassium: 4.5 mmol/L (ref 3.5–5.1)
Sodium: 138 mmol/L (ref 135–145)

## 2024-07-24 LAB — CBC
HCT: 32 % — ABNORMAL LOW (ref 36.0–46.0)
Hemoglobin: 10.2 g/dL — ABNORMAL LOW (ref 12.0–15.0)
MCH: 29.3 pg (ref 26.0–34.0)
MCHC: 31.9 g/dL (ref 30.0–36.0)
MCV: 92 fL (ref 80.0–100.0)
Platelets: 169 K/uL (ref 150–400)
RBC: 3.48 MIL/uL — ABNORMAL LOW (ref 3.87–5.11)
RDW: 14.6 % (ref 11.5–15.5)
WBC: 8.1 K/uL (ref 4.0–10.5)
nRBC: 0 % (ref 0.0–0.2)

## 2024-07-24 LAB — VITAMIN B12: Vitamin B-12: 219 pg/mL (ref 180–914)

## 2024-07-24 SURGERY — OPEN REDUCTION INTERNAL FIXATION HIP
Anesthesia: Spinal | Site: Hip | Laterality: Left

## 2024-07-24 MED ORDER — MIDAZOLAM HCL 5 MG/5ML IJ SOLN
INTRAMUSCULAR | Status: DC | PRN
  Administered 2024-07-24 (×2): 1 mg via INTRAVENOUS

## 2024-07-24 MED ORDER — TRANEXAMIC ACID-NACL 1000-0.7 MG/100ML-% IV SOLN
1000.0000 mg | INTRAVENOUS | Status: AC
  Administered 2024-07-24 (×4): 1000 mg via INTRAVENOUS

## 2024-07-24 MED ORDER — CEFAZOLIN SODIUM-DEXTROSE 2-4 GM/100ML-% IV SOLN
INTRAVENOUS | Status: AC
  Filled 2024-07-24: qty 100

## 2024-07-24 MED ORDER — BUPIVACAINE-EPINEPHRINE (PF) 0.5% -1:200000 IJ SOLN
INTRAMUSCULAR | Status: AC
  Filled 2024-07-24: qty 20

## 2024-07-24 MED ORDER — ROCURONIUM BROMIDE 10 MG/ML (PF) SYRINGE
PREFILLED_SYRINGE | INTRAVENOUS | Status: DC | PRN
  Administered 2024-07-24 (×2): 40 mg via INTRAVENOUS

## 2024-07-24 MED ORDER — ROCURONIUM BROMIDE 10 MG/ML (PF) SYRINGE
PREFILLED_SYRINGE | INTRAVENOUS | Status: AC
  Filled 2024-07-24: qty 10

## 2024-07-24 MED ORDER — 0.9 % SODIUM CHLORIDE (POUR BTL) OPTIME
TOPICAL | Status: DC | PRN
  Administered 2024-07-24 (×2): 500 mL

## 2024-07-24 MED ORDER — ACETAMINOPHEN 10 MG/ML IV SOLN
INTRAVENOUS | Status: AC
  Filled 2024-07-24: qty 100

## 2024-07-24 MED ORDER — FENTANYL CITRATE (PF) 100 MCG/2ML IJ SOLN
INTRAMUSCULAR | Status: DC | PRN
  Administered 2024-07-24 (×3): 25 ug via INTRAVENOUS
  Administered 2024-07-24 (×2): 50 ug via INTRAVENOUS
  Administered 2024-07-24: 25 ug via INTRAVENOUS

## 2024-07-24 MED ORDER — ASPIRIN 325 MG PO TBEC
325.0000 mg | DELAYED_RELEASE_TABLET | Freq: Every day | ORAL | Status: DC
  Administered 2024-07-25 – 2024-07-31 (×9): 325 mg via ORAL
  Filled 2024-07-24 (×7): qty 1

## 2024-07-24 MED ORDER — ONDANSETRON HCL 4 MG/2ML IJ SOLN
INTRAMUSCULAR | Status: DC | PRN
  Administered 2024-07-24 (×2): 4 mg via INTRAVENOUS

## 2024-07-24 MED ORDER — ORAL CARE MOUTH RINSE: 15.0000 mL | OROMUCOSAL | Status: DC | PRN

## 2024-07-24 MED ORDER — CEFAZOLIN SODIUM-DEXTROSE 2-4 GM/100ML-% IV SOLN: 2.0000 g | Freq: Four times a day (QID) | INTRAVENOUS | Status: DC

## 2024-07-24 MED ORDER — PROPOFOL 1000 MG/100ML IV EMUL
INTRAVENOUS | Status: AC
  Filled 2024-07-24: qty 100

## 2024-07-24 MED ORDER — LACTATED RINGERS IV BOLUS
250.0000 mL | Freq: Once | INTRAVENOUS | Status: AC
  Administered 2024-07-24 (×2): 250 mL via INTRAVENOUS

## 2024-07-24 MED ORDER — KETAMINE HCL 50 MG/5ML IJ SOSY
PREFILLED_SYRINGE | INTRAMUSCULAR | Status: DC | PRN
Start: 2024-07-24 — End: 2024-07-24
  Administered 2024-07-24 (×2): 20 mg via INTRAVENOUS

## 2024-07-24 MED ORDER — TRANEXAMIC ACID-NACL 1000-0.7 MG/100ML-% IV SOLN
INTRAVENOUS | Status: AC
Start: 2024-07-24 — End: 2024-07-24
  Filled 2024-07-24: qty 100

## 2024-07-24 MED ORDER — FENTANYL CITRATE (PF) 100 MCG/2ML IJ SOLN
INTRAMUSCULAR | Status: AC
  Filled 2024-07-24: qty 2

## 2024-07-24 MED ORDER — ONDANSETRON HCL 4 MG/2ML IJ SOLN
INTRAMUSCULAR | Status: AC
  Filled 2024-07-24: qty 2

## 2024-07-24 MED ORDER — BUPIVACAINE-EPINEPHRINE (PF) 0.5% -1:200000 IJ SOLN
INTRAMUSCULAR | Status: AC
  Filled 2024-07-24: qty 10

## 2024-07-24 MED ORDER — ACETAMINOPHEN 10 MG/ML IV SOLN: 1000.0000 mg | Freq: Once | INTRAVENOUS | Status: DC | PRN

## 2024-07-24 MED ORDER — PHENYLEPHRINE 80 MCG/ML (10ML) SYRINGE FOR IV PUSH (FOR BLOOD PRESSURE SUPPORT)
PREFILLED_SYRINGE | INTRAVENOUS | Status: DC | PRN
Start: 2024-07-24 — End: 2024-07-24
  Administered 2024-07-24 (×2): 160 ug via INTRAVENOUS
  Administered 2024-07-24: 80 ug via INTRAVENOUS
  Administered 2024-07-24 (×2): 160 ug via INTRAVENOUS
  Administered 2024-07-24: 80 ug via INTRAVENOUS

## 2024-07-24 MED ORDER — DEXAMETHASONE SODIUM PHOSPHATE 10 MG/ML IJ SOLN
INTRAMUSCULAR | Status: DC | PRN
  Administered 2024-07-24 (×2): 5 mg via INTRAVENOUS

## 2024-07-24 MED ORDER — PHENYLEPHRINE HCL-NACL 20-0.9 MG/250ML-% IV SOLN
INTRAVENOUS | Status: AC
  Filled 2024-07-24: qty 250

## 2024-07-24 MED ORDER — DROPERIDOL 2.5 MG/ML IJ SOLN: 0.6250 mg | Freq: Once | INTRAMUSCULAR | Status: DC | PRN

## 2024-07-24 MED ORDER — TRANEXAMIC ACID-NACL 1000-0.7 MG/100ML-% IV SOLN
INTRAVENOUS | Status: AC
  Filled 2024-07-24: qty 100

## 2024-07-24 MED ORDER — CEFAZOLIN SODIUM-DEXTROSE 2-3 GM-%(50ML) IV SOLR
INTRAVENOUS | Status: DC | PRN
Start: 2024-07-24 — End: 2024-07-24
  Administered 2024-07-24 (×2): 2 g via INTRAVENOUS

## 2024-07-24 MED ORDER — BUPIVACAINE-EPINEPHRINE (PF) 0.5% -1:200000 IJ SOLN
INTRAMUSCULAR | Status: DC | PRN
  Administered 2024-07-24 (×2): 30 mL via PERINEURAL

## 2024-07-24 MED ORDER — PHENYLEPHRINE HCL-NACL 20-0.9 MG/250ML-% IV SOLN
INTRAVENOUS | Status: DC | PRN
  Administered 2024-07-24 (×2): 50 ug/min via INTRAVENOUS

## 2024-07-24 MED ORDER — ENSURE PLUS HIGH PROTEIN PO LIQD
237.0000 mL | Freq: Two times a day (BID) | ORAL | Status: DC
  Administered 2024-07-25 – 2024-07-31 (×13): 237 mL via ORAL
  Filled 2024-07-24 (×2): qty 237

## 2024-07-24 MED ORDER — FENTANYL CITRATE (PF) 100 MCG/2ML IJ SOLN
25.0000 ug | INTRAMUSCULAR | Status: DC | PRN
  Administered 2024-07-24 (×2): 25 ug via INTRAVENOUS

## 2024-07-24 MED ORDER — PHENYLEPHRINE HCL-NACL 20-0.9 MG/250ML-% IV SOLN: 0.0000 ug/min | Freq: Once | INTRAVENOUS | Status: DC

## 2024-07-24 MED ORDER — PROPOFOL 500 MG/50ML IV EMUL
INTRAVENOUS | Status: DC | PRN
  Administered 2024-07-24 (×2): 50 ug/kg/min via INTRAVENOUS

## 2024-07-24 MED ORDER — OXYCODONE HCL 5 MG PO TABS: 5.0000 mg | ORAL_TABLET | Freq: Once | ORAL | Status: DC | PRN

## 2024-07-24 MED ORDER — OXYCODONE HCL 5 MG/5ML PO SOLN: 5.0000 mg | Freq: Once | ORAL | Status: DC | PRN

## 2024-07-24 MED ORDER — SUGAMMADEX SODIUM 200 MG/2ML IV SOLN
INTRAVENOUS | Status: DC | PRN
  Administered 2024-07-24 (×2): 200 mg via INTRAVENOUS

## 2024-07-24 MED ORDER — ADULT MULTIVITAMIN W/MINERALS CH
1.0000 | ORAL_TABLET | Freq: Every day | ORAL | Status: DC
  Administered 2024-07-25 – 2024-07-31 (×9): 1 via ORAL
  Filled 2024-07-24 (×7): qty 1

## 2024-07-24 MED ORDER — DEXAMETHASONE SODIUM PHOSPHATE 10 MG/ML IJ SOLN
INTRAMUSCULAR | Status: AC
  Filled 2024-07-24: qty 1

## 2024-07-24 MED ORDER — MIDAZOLAM HCL 2 MG/2ML IJ SOLN
INTRAMUSCULAR | Status: AC
  Filled 2024-07-24: qty 2

## 2024-07-24 MED ORDER — PROPOFOL 10 MG/ML IV BOLUS
INTRAVENOUS | Status: DC | PRN
  Administered 2024-07-24 (×2): 70 mg via INTRAVENOUS

## 2024-07-24 MED ORDER — KETAMINE HCL 50 MG/5ML IJ SOSY
PREFILLED_SYRINGE | INTRAMUSCULAR | Status: AC
  Filled 2024-07-24: qty 5

## 2024-07-24 MED ORDER — ACETAMINOPHEN 10 MG/ML IV SOLN
INTRAVENOUS | Status: DC | PRN
  Administered 2024-07-24 (×2): 1000 mg via INTRAVENOUS

## 2024-07-24 MED ORDER — LACTATED RINGERS IV SOLN: INTRAVENOUS | Status: DC | PRN

## 2024-07-24 SURGICAL SUPPLY — 42 items
ALCOHOL 70% 16 OZ (MISCELLANEOUS) ×1 IMPLANT
BIT DRILL CANN 16 HIP (BIT) IMPLANT
BIT DRILL CANN STP 6/9 HIP (BIT) IMPLANT
BIT DRILL SHORT 4.2 (BIT) IMPLANT
BIT DRILL TAPERED 10 (BIT) IMPLANT
BNDG COHESIVE 4X5 TAN STRL LF (GAUZE/BANDAGES/DRESSINGS) ×2 IMPLANT
CHLORAPREP W/TINT 26 (MISCELLANEOUS) ×1 IMPLANT
DRAPE C-ARM XRAY 36X54 (DRAPES) ×1 IMPLANT
DRAPE SHEET LG 3/4 BI-LAMINATE (DRAPES) ×1 IMPLANT
DRAPE U-SHAPE 47X51 STRL (DRAPES) ×2 IMPLANT
ELECTRODE REM PT RTRN 9FT ADLT (ELECTROSURGICAL) ×1 IMPLANT
GAUZE SPONGE 4X4 12PLY STRL (GAUZE/BANDAGES/DRESSINGS) ×4 IMPLANT
GAUZE XEROFORM 5X9 LF (GAUZE/BANDAGES/DRESSINGS) ×1 IMPLANT
GLOVE PI ORTHO PRO STRL SZ8 (GLOVE) ×1 IMPLANT
GLOVE SURG SYN 7.5 PF PI (GLOVE) ×2 IMPLANT
GOWN STRL REUS W/ TWL LRG LVL3 (GOWN DISPOSABLE) ×2 IMPLANT
GUIDEWIRE 3.2X400 (WIRE) IMPLANT
KIT TURNOVER KIT A (KITS) ×1 IMPLANT
MANIFOLD NEPTUNE II (INSTRUMENTS) ×1 IMPLANT
MAT ABSORB FLUID 56X50 GRAY (MISCELLANEOUS) ×1 IMPLANT
NAIL TROCH FIX LNG 11X360LT (Nail) IMPLANT
NDL HYPO 21X1.5 SAFETY (NEEDLE) ×1 IMPLANT
NEEDLE HYPO 21X1.5 SAFETY (NEEDLE) ×1 IMPLANT
NS IRRIG 1000ML POUR BTL (IV SOLUTION) ×1 IMPLANT
PACK HIP COMPR (MISCELLANEOUS) ×1 IMPLANT
PAD ABD DERMACEA PRESS 5X9 (GAUZE/BANDAGES/DRESSINGS) ×3 IMPLANT
PADDING CAST BLEND 4X4 NS (MISCELLANEOUS) ×2 IMPLANT
PENCIL SMOKE EVACUATOR (MISCELLANEOUS) ×1 IMPLANT
REAMER ROD DEEP FLUTE 2.5X950 (INSTRUMENTS) IMPLANT
SCREW LAG TFNA 90 HIP (Screw) IMPLANT
SCREW LOCK STAR 5X50 (Screw) IMPLANT
SCRUB CHG 4% DYNA-HEX 4OZ (MISCELLANEOUS) ×1 IMPLANT
STAPLER SKIN PROX 35W (STAPLE) ×1 IMPLANT
SUCTION TUBE FRAZIER 10FR DISP (SUCTIONS) IMPLANT
SUT VIC AB 0 CT1 36 (SUTURE) ×1 IMPLANT
SUT VIC AB 1 CT1 36 (SUTURE) ×1 IMPLANT
SUT VIC AB 2-0 CT1 (SUTURE) ×1 IMPLANT
SUT VICRYL 0 TIES 12 18 (SUTURE) IMPLANT
SYR 30ML LL (SYRINGE) ×1 IMPLANT
TAPE CLOTH SURG 4X10 WHT LF (GAUZE/BANDAGES/DRESSINGS) ×1 IMPLANT
TRAP FLUID SMOKE EVACUATOR (MISCELLANEOUS) ×1 IMPLANT
WATER STERILE IRR 1000ML POUR (IV SOLUTION) ×1 IMPLANT

## 2024-07-24 NOTE — Plan of Care (Signed)

## 2024-07-24 NOTE — Anesthesia Preprocedure Evaluation (Addendum)
 Anesthesia Evaluation  Patient identified by MRN, date of birth, ID band Patient confused  General Assessment Comment:Pt awake and conversational with very little ability to answer questions correctly.   Reviewed: Allergy & Precautions, H&P , NPO status , Patient's Chart, lab work & pertinent test results  Airway Mallampati: II  TM Distance: >3 FB Neck ROM: full    Dental  (+) Chipped   Pulmonary neg pulmonary ROS   Pulmonary exam normal        Cardiovascular hypertension, Normal cardiovascular exam     Neuro/Psych       Dementia Memory impairmentnegative neurological ROS     GI/Hepatic negative GI ROS, Neg liver ROS,,,  Endo/Other  diabetes, Type 2    Renal/GU Renal InsufficiencyRenal disease     Musculoskeletal   Abdominal  (+) + obese  Peds  Hematology  (+) Blood dyscrasia, anemia   Anesthesia Other Findings Past Medical History: No date: CKD (chronic kidney disease) No date: Diabetes mellitus without complication (HCC) No date: Essential hypertension, benign No date: Mixed hyperlipidemia  BMI    Body Mass Index: 30.54 kg/m      Reproductive/Obstetrics negative OB ROS                              Anesthesia Physical Anesthesia Plan  ASA: 3  Anesthesia Plan: General/Spinal   Post-op Pain Management: Ofirmev  IV (intra-op)*   Induction: Intravenous  PONV Risk Score and Plan: Propofol  infusion  Airway Management Planned: Natural Airway  Additional Equipment:   Intra-op Plan:   Post-operative Plan:   Informed Consent: I have reviewed the patients History and Physical, chart, labs and discussed the procedure including the risks, benefits and alternatives for the proposed anesthesia with the patient or authorized representative who has indicated his/her understanding and acceptance.     Dental Advisory Given and Consent reviewed with POA  Plan Discussed with: CRNA and  Surgeon  Anesthesia Plan Comments: (Back-up GETA discussed)         Anesthesia Quick Evaluation

## 2024-07-24 NOTE — Transfer of Care (Signed)
 Immediate Anesthesia Transfer of Care Note  Patient: [name] Griffin  Procedure(s) Performed: OPEN REDUCTION INTERNAL FIXATION HIP (Left: Hip)  Patient Location: PACU  Anesthesia Type:General  Level of Consciousness: drowsy and patient cooperative  Airway & Oxygen Therapy: Patient Spontanous Breathing and Patient connected to face mask oxygen  Post-op Assessment: Report given to RN and Post -op Vital signs reviewed and stable  Post vital signs: Reviewed and stable  Last Vitals:  Vitals Value Taken Time  BP 125/48 07/24/24 15:20  Temp    Pulse    Resp 19 07/24/24 15:25  SpO2    Vitals shown include unfiled device data.  Last Pain:  Vitals:   07/24/24 1122  TempSrc: Temporal  PainSc: 0-No pain         Complications: No notable events documented.

## 2024-07-24 NOTE — Op Note (Signed)
 ORTHOPEDIC OPERATIVE REPORT Lennis Rader [id number] 07/24/2024 Pre-op Diagnosis:  Closed left displaced intertrochanteric/subtrochanteric proximal femur fracture Post-op Diagnosis:  Closed left displaced intertrochanteric/subtrochanteric proximal femur fracture Procedure:  OPEN REDUCTION INTERNAL FIXATION HIP: 72754 (CPT)  Surgeon:  Cordella SAUNDERS. Maryrose, MD Assistant Surgeon:  Circulator: Aneta Dasie NOVAK, RN Scrub Person: Viktoria Jeoffrey CROME, NT Vendor Representative : Amedeo Mayo OR Clinical Technician: Estelle Ingle Anesthesia Type:   General Endotracheal Anesthesia Fluids:   500 ml of crystalloid. Estimated Blood Loss:   100 ml Urine output:   No Foley was employed for this case. Tourniquet:   No tourniquet was employed for this case. Complications:   None. Disposition:   The patient was taken to the PACU in stable condition having tolerated the procedure well. Grafts/Implants:    Implant Name Type Inv. Item Serial No. Manufacturer Lot No. LRB No. Used Action  NAIL TROCH FIX LNG [id number] - [id number] Nail NAIL TROCH FIX LNG [id number]  [employer] [id number] Left 1 Implanted  SCREW LAG TFNA 90 HIP - [id number] Screw SCREW LAG TFNA 90 HIP  [employer]  Left 1 Implanted  SCREW LOCK STAR 5X50 - [id number] Screw SCREW LOCK STAR 5X50  [employer]  Left 1 Implanted   Specimen(s):   None. Indication for surgery:   The patient is a 89 year old female who suffered a ground-level fall landing on her left hip.  She was transported to the local emergency department for a left intertrochanteric/subtrochanteric hip fracture was diagnosed.  Orthopedics was consulted.  Please see dictated history and physical examination and orthopedic consultation for complete details.  After discussing the case with the patient's son who is her primary caregiver the patient and her son wish to proceed with open reduction/internal fixation for pain control and to allow possible return to  ambulation. Operative technique:   After obtaining informed consent and having the patient medically cleared, the patient was taken to the operating room where they were placed under General Endotracheal Anesthesia in their bed for comfort. The patient was then positioned onto the fracture table in the supine position on a well-padded peroneal post. Well padded traction boots were applied and the lower extremities placed in gentle traction in the scissor position. The left hip was isolated with a nonsterile plastic U-drape. Fluoroscopy was employed to grossly align the fracture by use of the traction table.  The left hip was prepped and draped in routine fashion with Hibiclens and alcohol with a final ChloraPrep. The shower curtain draping technique was employed. Under fluoroscopic guidance the anatomy of the greater trochanter and femoral shaft were palpated and the anatomy outlined with a marking pen. A time-out then occurred per joint commission standards to positively identify the patient, the operative site, the operation, and the surgeon. All concurred.  An incision was made in the skin approximately 5 cm in length superior to the left hip greater trochanter region in line with the shaft. Mayo scissors were used to bluntly dissect down to the tip of the greater trochanter. A guide pin was placed at the tip of the greater trochanter and the reamer used to open the canal under fluoroscopic guidance. A ball-tipped guide wire was then carefully passed down the femoral canal to the distal femur. Wire position and fracture reduction were again confirmed in both the AP and lateral planes with fluoroscopy. I then began sequentially reaming from a size 8 to a size 12 reamer. A 11 mm Synthes TFN-A nail was selected after measurement in standard  technique. The proximal femur was reamed to 16 mm to account for the proximal diameter of the nail. The nail was placed in the jig and the alignment holes check. I then  placed the nail over the guide wire and passed it gently into the femoral canal. Under fluoroscopic guidance I aligned the head and neck region and the cannula was placed laterally through a small stab incision. The guide pin was placed up the head and neck. The length measured for a 90 mm screw. I then hand tapped the hole and found the bone quality in the femoral head to be quite good. The screw was positioned under fluoroscopic guidance to make sure it was appropriately positioned centered in the head. It was dynamized in standard fashion.  At this time traction was taken off the left lower extremity to gently impact the fracture. The distal interlocking screws were placed through small stab incisions from lateral to medial in routine fashion employing the freehand technique. The screws were measured and inserted by hand. Final images were obtained to make sure of appropriate fracture reduction, implant positioning, and no screws in the joint.  The incisions were copiously lavaged with normal saline by bulb syringe. The deep fascia was closed with 0 Vicryl in an interrupted pattern. The subcutaneous tissue was closed with 2-0 Vicryl in an inverted interrupted pattern. The skin was stapled. The patient was covered with a nonadherent sterile dressing and carefully positioned back into their bed. At the end the case limb lengths appeared equal. External rotation of the feet appeared equal. The toes were pink and warm with a brisk capillary time and no complications were noted from the traction boots. The patient was taken to the recovery room in stable condition having tolerated the procedure well. All sponge and needle counts were correct at the end of the case.  Implants: Synthes TFN-A Trochanteric Nail 360 mm x 11 mm with a 90 mm screw proximally.  The patient's family was updated on the patient's condition and the operative findings.    Cordella Lamar Knack M.D. 07/24/2024 3:26 PM

## 2024-07-24 NOTE — Discharge Instructions (Signed)
 ORTHOPEDIC DISCHARGE INSTRUCTIONS  Follow Up Appointment:  Follow-up in the office in 10-14 days.  Please contact the [facility] at [phone]  to schedule your follow-up appointment.  Dressing and cast care instructions:  After 48 hours, remove the large dressing and clean the incision with hydrogen peroxide.  Begin daily light, dry dressing changes.  Weight-bearing status:  You are weightbearing as tolerated on the LEFT LOWER EXTREMITY with your walker/crutches.   Diet:   You may resume your regular diet as tolerated.  Begin with clear liquids and slowly advance to your normal diet.  Medications:   Take your medicines as prescribed. You may have written prescriptions or your prescriptions may have been E-prescribed to your pharmacy. If you were given prescriptions for any blood thinners, it is a very important that you take these medications as prescribed to prevent blood clots.  General instructions:  Elevate the affected extremity to control pain and swelling. Apply ice packs to the affected area to control pain and swelling.  Surgery and pain medications may lead to constipation. It is easier to prevent this than it is to treat it. We recommend MiraLAX or Senna/Senakot for laxatives and Colace as a stool softener as needed after surgery. Drink plenty of fluids to stay well-hydrated. Consult your local pharmacist for other treatment recommendations.   Please perform deep breathing exercises every hour to increase the airflow in your lungs which could predispose you to running a low-grade fever and increase your risk of pneumonia.

## 2024-07-24 NOTE — Progress Notes (Signed)
 OT Cancellation Note  Patient Details Name: [name] Griffin MRN: [id number] DOB: 03-13-1928   Cancelled Treatment:    Reason Eval/Treat Not Completed: Patient not medically ready. Consult received, chart reviewed. Confirmed with RN, pt scheduled for surgical repair of L hip fracture this afternoon. Will hold OT evaluation and evaluate after surgery with new OT consult order.   Amaura Authier R., MPH, MS, OTR/L ascom [phone] 07/24/24, 8:44 AM

## 2024-07-24 NOTE — Anesthesia Procedure Notes (Signed)
 Spinal  Patient location during procedure: OR Start time: 07/24/2024 12:55 PM End time: 07/24/2024 1:08 PM Reason for block: surgical anesthesia Staffing Performed: anesthesiologist  Anesthesiologist: Vicci Camellia Glatter, MD Performed by: Vicci Camellia Glatter, MD Authorized by: Vicci Camellia Glatter, MD   Preanesthetic Checklist Completed: patient identified, IV checked, site marked, risks and benefits discussed, surgical consent, monitors and equipment checked, pre-op evaluation and timeout performed Spinal Block Patient position: right lateral decubitus Prep: DuraPrep Patient monitoring: heart rate, cardiac monitor, continuous pulse ox and blood pressure Approach: midline Location: L3-4 Injection technique: single-shot Needle Needle type: Pencan  Needle gauge: 24 G Needle length: 9 cm Additional Notes Aborted spinal. Midline and paramedian approaches attempted at L3/4 and L4/5 while patient was sedated. Pt tolerated procedure well.

## 2024-07-24 NOTE — Anesthesia Procedure Notes (Signed)
 Procedure Name: Intubation Date/Time: 07/24/2024 1:11 PM  Performed by: Lorriane Arabia, CRNAPre-anesthesia Checklist: Patient identified, Patient being monitored, Timeout performed, Emergency Drugs available and Suction available Patient Re-evaluated:Patient Re-evaluated prior to induction Oxygen Delivery Method: Circle system utilized Preoxygenation: Pre-oxygenation with 100% oxygen Induction Type: IV induction Ventilation: Mask ventilation without difficulty Laryngoscope Size: 3 and McGrath Grade View: Grade I Tube type: Oral Tube size: 7.0 mm Number of attempts: 1 Airway Equipment and Method: Stylet Placement Confirmation: ETT inserted through vocal cords under direct vision, positive ETCO2 and breath sounds checked- equal and bilateral Secured at: 21 cm Tube secured with: Tape Dental Injury: Teeth and Oropharynx as per pre-operative assessment

## 2024-07-24 NOTE — Progress Notes (Signed)
 Initial Nutrition Assessment  DOCUMENTATION CODES:   Obesity unspecified  INTERVENTION:   -Once diet is advanced, add:   -Ensure Plus High Protein po BID, each supplement provides 350 kcal and 20 grams of protein  -MVI with minerals daily  NUTRITION DIAGNOSIS:   Increased nutrient needs related to post-op healing as evidenced by estimated needs.  GOAL:   Patient will meet greater than or equal to 90% of their needs  MONITOR:   PO intake, Supplement acceptance, Diet advancement  REASON FOR ASSESSMENT:   Consult Assessment of nutrition requirement/status, Hip fracture protocol  ASSESSMENT:   Pt with medical history significant of HTN, HLD, diet-controlled DM, gout, CKD-3A, memory loss, hard of hearing, who presents with fall, left hip pain.  Pt with admitted with closed lt hip fracture and pelvic fracture.   Reviewed I/O's: -200 ml x 24 hours  UOP: 200 ml x 24 hours   Per orthopedics notes, plan for trochanteric nail today. Pt is currently NPO for procedure.   Pt unavailable at time of visit. Attempted to assess pt x 3, however, down in OR at times of visits. No family at bedside to provide additional history. RD unable to obtain further nutrition-related history or complete nutrition-focused physical exam at this time.    Pt previously on a heart healthy/ carb modified diet. No meal completion data available to assess at this time.   Reviewed wt hx; wt has been stable over the past year. Per nursing assessment, pt with lower extremity edema, which may be masking true weight loss as well as fat and muscle depletions.   Medications reviewed and include lasix .   Lab Results  Component Value Date   HGBA1C 6.0 (H) 07/27/2023   PTA DM medications are none.   Labs reviewed: CBGS: 100-125 (inpatient orders for glycemic control are none).    Diet Order:   Diet Order             Diet NPO time specified Except for: Sips with Meds, Ice Chips  Diet effective midnight                    EDUCATION NEEDS:   No education needs have been identified at this time  Skin:  Skin Assessment: Reviewed RN Assessment  Last BM:  07/23/24  Height:   Ht Readings from Last 1 Encounters:  07/24/24 5' 3 (1.6 m)    Weight:   Wt Readings from Last 1 Encounters:  07/24/24 78.2 kg    Ideal Body Weight:  52.3 kg  BMI:  Body mass index is 30.54 kg/m.  Estimated Nutritional Needs:   Kcal:  1550-1750  Protein:  80-95 grams  Fluid:  1.5-1.7 L    Margery ORN, RD, LDN, CDCES Registered Dietitian III Certified Diabetes Care and Education Specialist If unable to reach this RD, please use RD Inpatient group chat on secure chat between hours of 8am-4 pm daily

## 2024-07-24 NOTE — Progress Notes (Signed)
 PT Cancellation Note  Patient Details Name: [name] Griffin MRN: [id number] DOB: 13-Jun-1928   Cancelled Treatment:    Reason Eval/Treat Not Completed: Other (comment).  PT consult received.  Chart reviewed.  Per pt's nurse, plan for surgery today.  Will monitor pt's status and re-attempt PT evaluation tomorrow if medically appropriate (will need new PT order post op).  Damien Caulk, PT 07/24/24, 1:15 PM

## 2024-07-24 NOTE — Hospital Course (Signed)
 [name] Griffin is a 89 y.o. female with medical history significant of HTN, HLD, diet-controlled DM, gout, CKD-3A, memory loss, hard of hearing, who presents with fall, left hip pain.  Patient was brought to surgery today.

## 2024-07-24 NOTE — Progress Notes (Signed)
  Progress Note   Patient: [name] Griffin FMW:[id number] DOB: 02/04/1928 DOA: 07/23/2024     1 DOS: the patient was seen and examined on 07/24/2024   Brief hospital course: Sam Wunschel is a 89 y.o. female with medical history significant of HTN, HLD, diet-controlled DM, gout, CKD-3A, memory loss, hard of hearing, who presents with fall, left hip pain.  Patient was brought to surgery today.   Principal Problem:   Closed left hip fracture (HCC) Active Problems:   Fall at home, initial encounter   Oxygen desaturation   Elevated CK   Essential hypertension, benign   Type II diabetes mellitus with renal manifestations (HCC)   Chronic kidney disease, stage 3a (HCC)   Idiopathic gout   Memory impairment   Syncope   Obesity (BMI 30-39.9)   Assessment and Plan:  Closed left hip fracture and pelvic fracture: Fall at home. Syncope/presyncope.  X-ray showed  comminuted intertrochanteric fracture in the proximal left femur with 8 mm displacement of the lesser trochanter. Patient is going to surgery today. Patient had episodes of syncope/presyncope at home, etiology is unclear.  EEG is pending, I will also order echocardiogram.  Telemetry so far has no arrhythmia.   Oxygen desaturation:  No evidence of volume overload.  Continue oxygen.   Elevated CK: CK mildly elevated 432. -pt received 500 cc normal saline This is traumatic from fall.   Essential hypertension, benign Home medicines resumed.   Diet controlled type II diabetes mellitus with renal manifestations [facility]): Recent A1c 6.0, well-controlled.  Blood sugar 165  Chronic kidney disease, stage 3a (HCC):  Slightly worsening renal function, recheck a BMP tomorrow.   Idiopathic gout -Allopurinol    Memory impairment: No behavior disturbance -Fall precaution - Donepezil      Class I obesity (BMI 30-39.9): Patient has Obesity Class I, with body weight 78.2 Kg and BMI 30.54 kg/m2.  - Encourage losing weight - Exercise and  healthy diet     Subjective:  Patient is very hard of hearing.  Denied any short of breath or chest pain.  Physical Exam: Vitals:   07/23/24 2006 07/24/24 0342 07/24/24 0739 07/24/24 1122  BP: 135/86 127/82 (!) 120/43 116/76  Pulse: 76 71 66 66  Resp: 17 18 20 17   Temp: 98 F (36.7 C) 98 F (36.7 C) 98.2 F (36.8 C) 98 F (36.7 C)  TempSrc: Oral Oral  Temporal  SpO2: 98% 100% 93% 94%  Weight:    78.2 kg  Height:    5' 3 (1.6 m)   General exam: Appears calm and comfortable  Respiratory system: Clear to auscultation. Respiratory effort normal. Cardiovascular system: S1 & S2 heard, RRR. No JVD, murmurs, rubs, gallops or clicks. No pedal edema. Gastrointestinal system: Abdomen is nondistended, soft and nontender. No organomegaly or masses felt. Normal bowel sounds heard. Central nervous system: Alert and oriented X2. No focal neurological deficits. Extremities: Symmetric 5 x 5 power. Skin: No rashes, lesions or ulcers Psychiatry:  Mood & affect appropriate.    Data Reviewed:  Reviewed x-ray and lab results.  Family Communication: None  Disposition: Status is: Inpatient Remains inpatient appropriate because: Inpatient procedure, severity of disease.     Time spent: 35 minutes  Author: Murvin Mana, MD 07/24/2024 1:26 PM  For on call review [email or web address].

## 2024-07-25 ENCOUNTER — Inpatient Hospital Stay (HOSPITAL_COMMUNITY)
Admit: 2024-07-25 | Discharge: 2024-07-25 | Disposition: A | Discharge status: Home / Self Care | Attending: Internal Medicine | Admitting: Internal Medicine

## 2024-07-25 ENCOUNTER — Inpatient Hospital Stay

## 2024-07-25 ENCOUNTER — Encounter: Payer: Self-pay | Admitting: Internal Medicine

## 2024-07-25 DIAGNOSIS — S72002A Fracture of unspecified part of neck of left femur, initial encounter for closed fracture: Secondary | ICD-10-CM | POA: Diagnosis not present

## 2024-07-25 DIAGNOSIS — R55 Syncope and collapse: Secondary | ICD-10-CM

## 2024-07-25 DIAGNOSIS — Y92009 Unspecified place in unspecified non-institutional (private) residence as the place of occurrence of the external cause: Secondary | ICD-10-CM | POA: Diagnosis not present

## 2024-07-25 DIAGNOSIS — D62 Acute posthemorrhagic anemia: Secondary | ICD-10-CM | POA: Insufficient documentation

## 2024-07-25 DIAGNOSIS — W19XXXA Unspecified fall, initial encounter: Secondary | ICD-10-CM | POA: Diagnosis not present

## 2024-07-25 LAB — BASIC METABOLIC PANEL WITH GFR
Anion gap: 11 (ref 5–15)
BUN: 39 mg/dL — ABNORMAL HIGH (ref 8–23)
CO2: 26 mmol/L (ref 22–32)
Calcium: 8.5 mg/dL — ABNORMAL LOW (ref 8.9–10.3)
Chloride: 101 mmol/L (ref 98–111)
Creatinine, Ser: 1.41 mg/dL — ABNORMAL HIGH (ref 0.44–1.00)
GFR, Estimated: 35 mL/min — ABNORMAL LOW (ref >60)
Glucose, Bld: 129 mg/dL — ABNORMAL HIGH (ref 70–99)
Potassium: 4.5 mmol/L (ref 3.5–5.1)
Sodium: 138 mmol/L (ref 135–145)

## 2024-07-25 LAB — CBC
HCT: 25.9 % — ABNORMAL LOW (ref 36.0–46.0)
Hemoglobin: 8.1 g/dL — ABNORMAL LOW (ref 12.0–15.0)
MCH: 29.1 pg (ref 26.0–34.0)
MCHC: 31.3 g/dL (ref 30.0–36.0)
MCV: 93.2 fL (ref 80.0–100.0)
Platelets: 162 K/uL (ref 150–400)
RBC: 2.78 MIL/uL — ABNORMAL LOW (ref 3.87–5.11)
RDW: 14.3 % (ref 11.5–15.5)
WBC: 11.3 K/uL — ABNORMAL HIGH (ref 4.0–10.5)
nRBC: 0 % (ref 0.0–0.2)

## 2024-07-25 LAB — MAGNESIUM: Magnesium: 1.9 mg/dL (ref 1.7–2.4)

## 2024-07-25 LAB — GLUCOSE, CAPILLARY: Glucose-Capillary: 132 mg/dL — ABNORMAL HIGH (ref 70–99)

## 2024-07-25 LAB — FERRITIN: Ferritin: 144 ng/mL (ref 11–307)

## 2024-07-25 MED ORDER — LIDOCAINE HCL (PF) 1 % IJ SOLN
10.0000 mL | Freq: Once | INTRAMUSCULAR | Status: DC
  Filled 2024-07-25: qty 10

## 2024-07-25 MED ORDER — VITAMIN B-12 1000 MCG PO TABS
1000.0000 ug | ORAL_TABLET | Freq: Every day | ORAL | Status: DC
  Administered 2024-07-25 – 2024-07-31 (×9): 1000 ug via ORAL
  Filled 2024-07-25 (×7): qty 1

## 2024-07-25 MED ORDER — TRIAMCINOLONE ACETONIDE 40 MG/ML IJ SUSP
80.0000 mg | Freq: Once | INTRAMUSCULAR | Status: DC
  Filled 2024-07-25 (×2): qty 2

## 2024-07-25 MED ORDER — TRIAMCINOLONE ACETONIDE 40 MG/ML IJ SUSP: 80.0000 mg | Freq: Once | INTRAMUSCULAR | Status: DC

## 2024-07-25 NOTE — Plan of Care (Signed)
  Problem: Education: Goal: Knowledge of General Education information will improve Description: Including pain rating scale, medication(s)/side effects and non-pharmacologic comfort measures Outcome: Progressing   Problem: Nutrition: Goal: Adequate nutrition will be maintained Outcome: Progressing   Problem: Coping: Goal: Level of anxiety will decrease Outcome: Progressing   Problem: Elimination: Goal: Will not experience complications related to bowel motility Outcome: Progressing Goal: Will not experience complications related to urinary retention Outcome: Progressing   Problem: Pain Managment: Goal: General experience of comfort will improve and/or be controlled Outcome: Progressing   Problem: Safety: Goal: Ability to remain free from injury will improve Outcome: Progressing

## 2024-07-25 NOTE — Progress Notes (Addendum)
   Subjective: 1 Day Post-Op Procedure(s) (LRB): OPEN REDUCTION INTERNAL FIXATION HIP (Left) Patient reports pain as mild.   Difficult historian. Hx of dementia. We will start therapy today.    Objective: Vital signs in last 24 hours: Temp:  [97.3 F (36.3 C)-98 F (36.7 C)] 97.6 F (36.4 C) (08/12 0518) Pulse Rate:  [55-96] 71 (08/12 0518) Resp:  [14-27] 16 (08/12 0518) BP: (81-125)/(32-97) 90/47 (08/12 0518) SpO2:  [86 %-100 %] 99 % (08/12 0518) Weight:  [78.2 kg] 78.2 kg (08/11 1122)  Intake/Output from previous day: 08/11 0701 - 08/12 0700 In: 1550 [I.V.:600; IV Piggyback:950] Out: 600 [Urine:500; Blood:100] Intake/Output this shift: No intake/output data recorded.  Recent Labs    07/23/24 1136 07/24/24 0506 07/25/24 0554  HGB 13.0 10.2* 8.1*   Recent Labs    07/24/24 0506 07/25/24 0554  WBC 8.1 11.3*  RBC 3.48* 2.78*  HCT 32.0* 25.9*  PLT 169 162   Recent Labs    07/24/24 0506 07/25/24 0554  NA 138 138  K 4.5 4.5  CL 104 101  CO2 27 26  BUN 30* 39*  CREATININE 1.24* 1.41*  GLUCOSE 119* 129*  CALCIUM 8.7* 8.5*   Recent Labs    07/23/24 1221  INR 1.2    EXAM General - Patient is Alert and Confused Extremity - Sensation intact distally Intact pulses distally Dorsiflexion/Plantar flexion intact Dressing - dressing C/D/I and no drainage Motor Function - intact, moving foot and toes well on exam.   Past Medical History:  Diagnosis Date   CKD (chronic kidney disease)    Diabetes mellitus without complication (HCC)    Essential hypertension, benign    Mixed hyperlipidemia     Assessment/Plan:   1 Day Post-Op Procedure(s) (LRB): OPEN REDUCTION INTERNAL FIXATION HIP (Left) Principal Problem:   Closed left hip fracture (HCC) Active Problems:   Idiopathic gout   Essential hypertension, benign   Memory impairment   Fall at home, initial encounter   Type II diabetes mellitus with renal manifestations (HCC)   Chronic kidney disease,  stage 3a (HCC)   Oxygen desaturation   Obesity (BMI 30-39.9)   Elevated CK   Syncope   Acute blood loss anemia  Estimated body mass index is 30.54 kg/m as calculated from the following:   Height as of this encounter: 5' 3 (1.6 m).   Weight as of this encounter: 78.2 kg. Advance diet Up with therapy Pain well controlled Hgb 8.1. recheck labs tomorrow. Consider transfusion if worsening anemia  Follow up with [facility] in 2 weeks  DVT Prophylaxis - Aspirin , TED hose, and SCDs Weight-Bearing as tolerated to left leg   T. Medford Amber, PA-C [facility] 07/25/2024, 8:14 AM    ADDENDUM: Agree with above note.  Patient seen earlier this afternoon.  Plan for going to SNF, pending insurance.  Per the patient's son, who is at the bedside, the patient has had longstanding bilateral knee pain and was informed by Dr. Maryrose that she does have significant underlying arthritis.  He feels that her postoperative recovery has been to be limited by knee pain and was inquiring about possibility of corticosteroid injection to bilateral knees.  Patient has not had any injections to the knees previously.  Given this, I have placed an order for bilateral knee x-rays.  We can plan for potential corticosteroid injections to bilateral knees tomorrow.

## 2024-07-25 NOTE — Progress Notes (Addendum)
  Progress Note   Patient: [name] Griffin FMW:[id number] DOB: 1928/07/02 DOA: 07/23/2024     2 DOS: the patient was seen and examined on 07/25/2024   Brief hospital course: [name] Griffin is a 89 y.o. female with medical history significant of HTN, HLD, diet-controlled DM, gout, CKD-3A, memory loss, hard of hearing, who presents with fall, left hip pain.  Patient was brought to surgery 8/11.    Principal Problem:   Closed left hip fracture (HCC) Active Problems:   Fall at home, initial encounter   Oxygen desaturation   Elevated CK   Essential hypertension, benign   Type II diabetes mellitus with renal manifestations (HCC)   Chronic kidney disease, stage 3a (HCC)   Idiopathic gout   Memory impairment   Syncope   Obesity (BMI 30-39.9)   Acute blood loss anemia   Assessment and Plan: Closed left hip fracture and pelvic fracture: Fall at home. Syncope/presyncope.  X-ray showed  comminuted intertrochanteric fracture in the proximal left femur with 8 mm displacement of the lesser trochanter. Patient had episodes of syncope/presyncope at home, etiology is unclear.   Telemetry did not show any arrhythmia, echocardiogram pending to evaluate. Patient s/p surgery, doing well.  Will be evaluated by PT/OT, most likely need a nursing home placement    Acute blood loss anemia. Secondary surgery, no need for transfusion.  Continue to follow.   Oxygen desaturation:  No evidence of volume overload.  Continue oxygen.   Elevated CK: CK mildly elevated 432. -pt received 500 cc normal saline This is traumatic from fall.   Essential hypertension, benign Home medicines resumed.   Diet controlled type II diabetes mellitus with renal manifestations [facility]): Recent A1c 6.0, well-controlled.  Blood sugar 165   Chronic kidney disease, stage 3a (HCC):  Slightly worsening renal function, recheck a BMP tomorrow.   Idiopathic gout -Allopurinol    Memory impairment: No behavior disturbance -Fall  precaution - Donepezil      Class I obesity (BMI 30-39.9): Patient has Obesity Class I, with body weight 78.2 Kg and BMI 30.54 kg/m2.  - Encourage losing weight - Exercise and healthy diet      Subjective:  Doing well today, no complaints.  Very hard of hearing  Physical Exam: Vitals:   07/24/24 1942 07/24/24 2347 07/25/24 0518 07/25/24 0912  BP: (!) 119/97 115/65 (!) 90/47 (!) 98/39  Pulse: 96 80 71 61  Resp: 16 19 16 17   Temp: 97.6 F (36.4 C) 97.7 F (36.5 C) 97.6 F (36.4 C) 98.1 F (36.7 C)  TempSrc: Oral Oral  Axillary  SpO2: 95% 97% 99% 100%  Weight:      Height:       General exam: Appears calm and comfortable  Respiratory system: Clear to auscultation. Respiratory effort normal. Cardiovascular system: S1 & S2 heard, RRR. No JVD, murmurs, rubs, gallops or clicks. No pedal edema. Gastrointestinal system: Abdomen is nondistended, soft and nontender. No organomegaly or masses felt. Normal bowel sounds heard. Central nervous system: Alert and oriented x2.  Severe hearing defect Extremities: Symmetric 5 x 5 power. Skin: No rashes, lesions or ulcers Psychiatry: Mood & affect appropriate.    Data Reviewed:  Lab results reviewed  Family Communication: Not able to reach son  Disposition: Status is: Inpatient Remains inpatient appropriate because: Severity of disease, also pending nursing home placement     Time spent: 35 minutes  Author: Murvin Mana, MD 07/25/2024 12:03 PM  For on call review [email or web address].

## 2024-07-25 NOTE — Plan of Care (Signed)
  Problem: Education: Goal: Knowledge of General Education information will improve Description: Including pain rating scale, medication(s)/side effects and non-pharmacologic comfort measures Outcome: Progressing   Problem: Clinical Measurements: Goal: Cardiovascular complication will be avoided Outcome: Progressing   Problem: Coping: Goal: Level of anxiety will decrease Outcome: Progressing   

## 2024-07-25 NOTE — Anesthesia Postprocedure Evaluation (Signed)
 Anesthesia Post Note  Patient: [name] Griffin John  Procedure(s) Performed: OPEN REDUCTION INTERNAL FIXATION HIP (Left: Hip)  Patient location during evaluation: Nursing Unit Anesthesia Type: Spinal Level of consciousness: awake Pain management: pain level controlled Respiratory status: spontaneous breathing Cardiovascular status: stable Postop Assessment: no headache Anesthetic complications: no   No notable events documented.   Last Vitals:  Vitals:   07/24/24 2347 07/25/24 0518  BP: 115/65 (!) 90/47  Pulse: 80 71  Resp: 19 16  Temp: 36.5 C 36.4 C  SpO2: 97% 99%    Last Pain:  Vitals:   07/25/24 0518  TempSrc:   PainSc: 0-No pain                 Shona Earnie Fare

## 2024-07-25 NOTE — Evaluation (Signed)
 Occupational Therapy Evaluation Patient Details Name: [name] Griffin MRN: [id number] DOB: Mar 29, 1928 Today's Date: 07/25/2024   History of Present Illness   Pt is a 89 y.o. female presenting to hospital 07/23/24 s/p unwitnessed fall; c/o L hip pain; per son pt with multiple brief episodes of possible syncope (described as suddenly stopped talking, with confusion and eye staring lasting for a few seconds).  Imaging showing acute comminuted L femoral intra trochanteric fx and healed/healing R superior and inferior pubic rami fx's; possible healing R sacral ala fx's.  Pt admitted with closed L hip fx, pelvic fx, s/p fall, oxygen desaturation, elevated CK, and syncope.  S/p ORIF L hip 07/24/24.  PMH includes DM, htn, CKD, gout, HOH, memory loss (h/o mild dementia).    Clinical Impressions Pt was seen for OT evaluation this date. Prior to hospital admission, pt was mod independent for ADL and mobility short distances. Pt eager to remain as independent as possible. Pt presents with deficits in strength, balance, activity tolerance, and questionable cognition (HOH compounding factor), affecting safe and optimal ADL completion. Pt currently requires MAX A for LB ADL, MAX A +2 with RW for STS and SPT, and CGA in sitting EOB for balance. VC for hand placement to improve transfer safety. Pt would benefit from skilled OT services to address noted impairments and functional limitations (see below for any additional details) in order to maximize safety and independence while minimizing falls risk and caregiver burden. Anticipate the need for follow up OT services upon acute hospital DC.    If plan is discharge home, recommend the following:   Two people to help with walking and/or transfers;A lot of help with bathing/dressing/bathroom;Direct supervision/assist for medications management;Supervision due to cognitive status;Direct supervision/assist for financial management;Assist for transportation;Assistance with  cooking/housework;Help with stairs or ramp for entrance     Functional Status Assessment   Patient has had a recent decline in their functional status and demonstrates the ability to make significant improvements in function in a reasonable and predictable amount of time.     Equipment Recommendations   Other (comment) (defer)      Precautions/Restrictions   Precautions Precautions: Fall Recall of Precautions/Restrictions: Impaired Precaution/Restrictions Comments: Seizure precautions Restrictions Weight Bearing Restrictions Per Provider Order: Yes LLE Weight Bearing Per Provider Order: Weight bearing as tolerated Other Position/Activity Restrictions: WBAT L LE with walker     Mobility Bed Mobility Overal bed mobility: Needs Assistance Bed Mobility: Sit to Supine       Sit to supine: Mod assist, +2 for physical assistance        Transfers Overall transfer level: Needs assistance Equipment used: Rolling walker (2 wheels) Transfers: Sit to/from Stand, Bed to chair/wheelchair/BSC Sit to Stand: Max assist, +2 physical assistance Stand pivot transfers: Max assist, +2 physical assistance                Balance Overall balance assessment: Needs assistance Sitting-balance support: Bilateral upper extremity supported, Feet supported Sitting balance-Leahy Scale: Fair     Standing balance support: Bilateral upper extremity supported, Reliant on assistive device for balance Standing balance-Leahy Scale: Zero Standing balance comment: MAX A +2 to support/maintain upright while pivoting to EOB     ADL either performed or assessed with clinical judgement   ADL Overall ADL's : Needs assistance/impaired        General ADL Comments: Pt currently requires MAX for all LB ADL, MAX A +2 for all aspects of toileting and ADL transfers, and CGA for EOB grooming  2/2 decr balance, strength      Pertinent Vitals/Pain Pain Assessment Pain Assessment: No/denies pain      Extremity/Trunk Assessment Upper Extremity Assessment Upper Extremity Assessment: Generalized weakness   Lower Extremity Assessment Lower Extremity Assessment: Generalized weakness   Cervical / Trunk Assessment Cervical / Trunk Assessment: Other exceptions Cervical / Trunk Exceptions: forward head/shoulders   Communication Communication Communication: Impaired Factors Affecting Communication: Hearing impaired   Cognition Arousal: Alert Behavior During Therapy: Impulsive Cognition: Difficult to assess Difficult to assess due to: Hard of hearing/deaf           OT - Cognition Comments: a bit impulsive, cues for safety and not to stand before therapists were ready                 Following commands: Impaired Following commands impaired: Follows one step commands inconsistently, Follows one step commands with increased time     Cueing  General Comments   Cueing Techniques: Verbal cues;Gestural cues;Tactile cues;Visual cues  L hip dressing in place; no drainage noted on dressing beginning/end of session           Home Living Family/patient expects to be discharged to:: Private residence Living Arrangements: Alone Available Help at Discharge: Family;Available PRN/intermittently Type of Home: Mobile home Home Access: Ramped entrance;Stairs to enter Entrance Stairs-Number of Steps: ramp plus 1 step to enter Entrance Stairs-Rails:  (railings on ramp but not step to enter home) Home Layout: One level     Bathroom Shower/Tub: Tub/shower unit;Walk-in shower   Bathroom Toilet: Standard (has BSC over toilet)     Home Equipment: Rollator (4 wheels);BSC/3in1;Grab bars - tub/shower;Wheelchair - manual          Prior Functioning/Environment     Mobility Comments: Pt modified independent ambulating short household distances with rollator use; no other recent falls reported.  Uses manual w/c for any out of home mobility with family assist. ADLs Comments: Pt  modified independent with dressing and bird baths.  Family assists with grocery shopping and meal prep (pt able to microwave meals and make own coffee).    OT Problem List: Decreased strength;Decreased safety awareness;Decreased activity tolerance;Impaired balance (sitting and/or standing);Decreased knowledge of use of DME or AE   OT Treatment/Interventions: Self-care/ADL training;Therapeutic exercise;Therapeutic activities;Cognitive remediation/compensation;Energy conservation;DME and/or AE instruction;Patient/family education;Balance training      OT Goals(Current goals can be found in the care plan section)   Acute Rehab OT Goals Patient Stated Goal: get better and stay independent OT Goal Formulation: With patient/family Time For Goal Achievement: 08/08/24 Potential to Achieve Goals: Good ADL Goals Pt Will Perform Lower Body Dressing: sit to/from stand;sitting/lateral leans;with mod assist Pt Will Transfer to Toilet: with mod assist;bedside commode;stand pivot transfer (LRAD) Pt Will Perform Toileting - Clothing Manipulation and hygiene: with mod assist;sitting/lateral leans;sit to/from stand Additional ADL Goal #1: Pt will tolerate sitting for EOB ADL tasks for at least without LOB, 3/3 opportunities.   OT Frequency:  Min 2X/week       AM-PAC OT 6 Clicks Daily Activity     Outcome Measure Help from another person eating meals?: None Help from another person taking care of personal grooming?: A Little Help from another person toileting, which includes using toliet, bedpan, or urinal?: Total Help from another person bathing (including washing, rinsing, drying)?: A Lot Help from another person to put on and taking off regular upper body clothing?: A Lot Help from another person to put on and taking off regular lower body clothing?: A Lot  6 Click Score: 14   End of Session Equipment Utilized During Treatment: Gait belt;Rolling walker (2 wheels) Nurse Communication:  Mobility status  Activity Tolerance: Patient tolerated treatment well Patient left: in bed;with call bell/phone within reach;with bed alarm set;with family/visitor present;with SCD's reapplied  OT Visit Diagnosis: Other abnormalities of gait and mobility (R26.89);Muscle weakness (generalized) (M62.81)                Time: 8644-8591 OT Time Calculation (min): 13 min Charges:  OT General Charges $OT Visit: 1 Visit OT Evaluation $OT Eval Moderate Complexity: 1 Mod OT Treatments $Self Care/Home Management : 8-22 mins  Warren SAUNDERS., MPH, MS, OTR/L ascom [phone] 07/25/24, 2:37 PM

## 2024-07-25 NOTE — Progress Notes (Signed)
 Echocardiogram 2D Echocardiogram has been performed.  [name] Griffin 07/25/2024, 5:44 PM

## 2024-07-25 NOTE — Progress Notes (Signed)
 Nutrition Follow-up  DOCUMENTATION CODES:   Obesity unspecified  INTERVENTION:   -Liberalize diet to regular for widest variety of meal selections -Continue Ensure Plus High Protein po BID, each supplement provides 350 kcal and 20 grams of protein  -Continue MVI with minerals daily  NUTRITION DIAGNOSIS:   Increased nutrient needs related to post-op healing as evidenced by estimated needs.  Ongoing  GOAL:   Patient will meet greater than or equal to 90% of their needs  Progressing   MONITOR:   PO intake, Supplement acceptance, Diet advancement  REASON FOR ASSESSMENT:   Consult Assessment of nutrition requirement/status, Hip fracture protocol  ASSESSMENT:   Pt with medical history significant of HTN, HLD, diet-controlled DM, gout, CKD-3A, memory loss, hard of hearing, who presents with fall, left hip pain.  8/11- s/p OPEN REDUCTION INTERNAL FIXATION HIP: 72754 (CPT)   Reviewed I/O's: +1.2 L x 24 hours and +990 ml x 24 hours  UOP: 500 ml x 24 hours  Per orthopedics notes, plan to start therapy today.   Pt lying in bed at time of visit. RN trying to redirect as pt was confused. Unable to provide history secondary to dementia.   Pt on a heart healthy diet. She consumed this morning's Ensure supplement.   Wt has been stable since admission. Pt with lower extremity edema, which may be masking true weight loss as well as fat and muscle depletions.   Medications reviewed and include lasix .   Labs reviewed: CBGS: 101-132 (inpatient orders for glycemic control are none).    NUTRITION - FOCUSED PHYSICAL EXAM:  Flowsheet Row Most Recent Value  Orbital Region No depletion  Upper Arm Region No depletion  Thoracic and Lumbar Region No depletion  Buccal Region No depletion  Temple Region No depletion  Clavicle Bone Region No depletion  Clavicle and Acromion Bone Region No depletion  Scapular Bone Region No depletion  Dorsal Hand No depletion  Patellar Region No  depletion  Anterior Thigh Region No depletion  Posterior Calf Region No depletion  Edema (RD Assessment) Mild  Hair Reviewed  Eyes Reviewed  Mouth Reviewed  Skin Reviewed  Nails Reviewed    Diet Order:   Diet Order             Diet Heart Room service appropriate? Yes; Fluid consistency: Thin  Diet effective now                   EDUCATION NEEDS:   No education needs have been identified at this time  Skin:  Skin Assessment: Reviewed RN Assessment  Last BM:  07/23/24  Height:   Ht Readings from Last 1 Encounters:  07/24/24 5' 3 (1.6 m)    Weight:   Wt Readings from Last 1 Encounters:  07/24/24 78.2 kg    Ideal Body Weight:  52.3 kg  BMI:  Body mass index is 30.54 kg/m.  Estimated Nutritional Needs:   Kcal:  1550-1750  Protein:  80-95 grams  Fluid:  1.5-1.7 L    Margery ORN, RD, LDN, CDCES Registered Dietitian III Certified Diabetes Care and Education Specialist If unable to reach this RD, please use RD Inpatient group chat on secure chat between hours of 8am-4 pm daily

## 2024-07-25 NOTE — Evaluation (Signed)
 Physical Therapy Evaluation Patient Details Name: [name] Griffin MRN: [id number] DOB: 12-Jun-1928 Today's Date: 07/25/2024  History of Present Illness  Pt is a 89 y.o. female presenting to hospital 07/23/24 s/p unwitnessed fall; c/o L hip pain; per son pt with multiple brief episodes of possible syncope (described as suddenly stopped talking, with confusion and eye staring lasting for a few seconds).  Imaging showing acute comminuted L femoral intra trochanteric fx and healed/healing R superior and inferior pubic rami fx's; possible healing R sacral ala fx's.  Pt admitted with closed L hip fx, pelvic fx, s/p fall, oxygen desaturation, elevated CK, and syncope.  S/p ORIF L hip 07/24/24.  PMH includes DM, htn, CKD, gout, HOH, memory loss (h/o mild dementia).  Clinical Impression  Prior to recent medical concerns, pt's son reports pt being modified independent ambulating short distances in home with rollator use; uses manual w/c with family assist for any out of home activity; son/son's girlfriend stops by daily to assist as needed and prepare meals (pt uses microwave to reheat meals); lives alone in 1 level home with ramp plus 1 step to enter.  Currently pt is max assist semi-supine to sitting EOB; unable to fully stand up to RW with 1 assist; and max assist for lateral scoot bed to recliner (to R).  Pt very HOH and did not appear to be able to read lips well during session requiring gestures and visual feedback for tasks/activities.  Pt would currently benefit from skilled PT to address noted impairments and functional limitations (see below for any additional details).  Upon hospital discharge, pt would benefit from ongoing therapy.     If plan is discharge home, recommend the following: Two people to help with walking and/or transfers;A lot of help with bathing/dressing/bathroom;Assistance with cooking/housework;Direct supervision/assist for medications management;Assist for transportation;Help with stairs  or ramp for entrance;Supervision due to cognitive status   Can travel by private vehicle   No    Equipment Recommendations Other (comment) (TBD at next facility)  Recommendations for Other Services       Functional Status Assessment Patient has had a recent decline in their functional status and demonstrates the ability to make significant improvements in function in a reasonable and predictable amount of time.     Precautions / Restrictions Precautions Precautions: Fall Recall of Precautions/Restrictions: Impaired Precaution/Restrictions Comments: Seizure precautions Restrictions Weight Bearing Restrictions Per Provider Order: Yes LLE Weight Bearing Per Provider Order: Weight bearing as tolerated Other Position/Activity Restrictions: WBAT L LE with walker      Mobility  Bed Mobility Overal bed mobility: Needs Assistance Bed Mobility: Supine to Sit     Supine to sit: Max assist, HOB elevated     General bed mobility comments: assist for trunk and B LE's    Transfers Overall transfer level: Needs assistance Equipment used: Rolling walker (2 wheels) Transfers: Sit to/from Stand, Bed to chair/wheelchair/BSC Sit to Stand: Total assist          Lateral/Scoot Transfers: Max assist General transfer comment: x3 trials standing from bed but unable to achieve full stand with 1 assist and max cueing (pt tending to stay in flexed posture); max assist lateral scoot to R bed to recliner (arm rest moved); cueing for technique    Ambulation/Gait               General Gait Details: unable to fully stand to attempt  Stairs            Wheelchair Mobility  Tilt Bed    Modified Rankin (Stroke Patients Only)       Balance Overall balance assessment: Needs assistance Sitting-balance support: Bilateral upper extremity supported, Feet supported Sitting balance-Leahy Scale: Fair Sitting balance - Comments: steady static sitting       Standing balance  comment: unable to fully stand to assess                             Pertinent Vitals/Pain Pain Assessment Pain Assessment: Faces Faces Pain Scale: Hurts little more Pain Location: L hip/thigh and headache Pain Descriptors / Indicators: Aching, Discomfort, Guarding, Headache Pain Intervention(s): Limited activity within patient's tolerance, Monitored during session, Premedicated before session, Repositioned, Other (comment) (RN notified of pt's pain and request for medication (pt not due yet per nursing--pt and pt's family notified)) HR stable during session (70's to 80's bpm); SpO2 sats 96% or greater on 2 L O2 via nasal cannula at rest beginning of session (unable to get reading end of session but no respiratory concerns noted).    Home Living Family/patient expects to be discharged to:: Private residence Living Arrangements: Alone Available Help at Discharge: Family;Available PRN/intermittently Type of Home: Mobile home Home Access: Ramped entrance;Stairs to enter Entrance Stairs-Rails:  (railings on ramp but not step to enter home) Entrance Stairs-Number of Steps: ramp plus 1 step to enter   Home Layout: One level Home Equipment: Rollator (4 wheels);BSC/3in1;Grab bars - tub/shower;Wheelchair - manual      Prior Function               Mobility Comments: Pt modified independent ambulating short household distances with rollator use; no other recent falls reported.  Uses manual w/c for any out of home mobility with family assist. ADLs Comments: Pt modified independent with dressing and bird baths.  Family assists with grocery shopping and meal prep (pt able to microwave meals and make own coffee).     Extremity/Trunk Assessment   Upper Extremity Assessment Upper Extremity Assessment: Generalized weakness    Lower Extremity Assessment Lower Extremity Assessment: Generalized weakness    Cervical / Trunk Assessment Cervical / Trunk Assessment: Other  exceptions Cervical / Trunk Exceptions: forward head/shoulders  Communication   Communication Communication: Impaired Factors Affecting Communication: Hearing impaired    Cognition Arousal: Alert Behavior During Therapy: Impulsive   PT - Cognitive impairments: History of cognitive impairments, Memory, Attention, Initiation, Sequencing, Problem solving, Safety/Judgement                       PT - Cognition Comments: Difficult to assess d/t pt's significant HOH. Following commands: Impaired (complicated d/t HOH) Following commands impaired: Follows one step commands inconsistently, Follows one step commands with increased time     Cueing Cueing Techniques: Verbal cues, Gestural cues, Tactile cues, Visual cues     General Comments General comments (skin integrity, edema, etc.): L hip dressing in place; no drainage noted on dressing beginning/end of session.  Nursing cleared pt for participation in physical therapy.  Pt agreeable to PT session.  Nurse notified regarding pt's pain status (including HA).    Exercises     Assessment/Plan    PT Assessment Patient needs continued PT services  PT Problem List Decreased strength;Decreased activity tolerance;Decreased balance;Decreased mobility;Decreased knowledge of use of DME;Decreased knowledge of precautions;Decreased skin integrity;Pain       PT Treatment Interventions DME instruction;Gait training;Stair training;Functional mobility training;Therapeutic activities;Therapeutic exercise;Balance training;Patient/family education    PT  Goals (Current goals can be found in the Care Plan section)  Acute Rehab PT Goals Patient Stated Goal: to improve functional mobility PT Goal Formulation: With patient/family Time For Goal Achievement: 08/08/24 Potential to Achieve Goals: Fair    Frequency 7X/week     Co-evaluation               AM-PAC PT 6 Clicks Mobility  Outcome Measure Help needed turning from your back to  your side while in a flat bed without using bedrails?: A Lot Help needed moving from lying on your back to sitting on the side of a flat bed without using bedrails?: A Lot Help needed moving to and from a bed to a chair (including a wheelchair)?: A Lot Help needed standing up from a chair using your arms (e.g., wheelchair or bedside chair)?: Total Help needed to walk in hospital room?: Total Help needed climbing 3-5 steps with a railing? : Total 6 Click Score: 9    End of Session Equipment Utilized During Treatment: Gait belt;Oxygen (2 L via nasal cannula) Activity Tolerance: Patient limited by fatigue Patient left: in chair;with call bell/phone within reach;with chair alarm set;with family/visitor present;with SCD's reapplied (Family planning to stay with pt until staff assists her back to bed) Nurse Communication: Mobility status;Precautions;Patient requests pain meds;Weight bearing status PT Visit Diagnosis: Other abnormalities of gait and mobility (R26.89);Muscle weakness (generalized) (M62.81);History of falling (Z91.81);Pain Pain - Right/Left: Left Pain - part of body: Hip    Time: 1231-1310 PT Time Calculation (min) (ACUTE ONLY): 39 min   Charges:   PT Evaluation $PT Eval Low Complexity: 1 Low PT Treatments $Therapeutic Activity: 23-37 mins PT General Charges $$ ACUTE PT VISIT: 1 Visit        Damien Caulk, PT 07/25/24, 2:41 PM

## 2024-07-26 ENCOUNTER — Encounter: Payer: Self-pay | Admitting: Orthopedic Surgery

## 2024-07-26 DIAGNOSIS — S72002A Fracture of unspecified part of neck of left femur, initial encounter for closed fracture: Secondary | ICD-10-CM | POA: Diagnosis not present

## 2024-07-26 LAB — ECHOCARDIOGRAM COMPLETE
AR max vel: 2.15 cm2
AV Peak grad: 13.4 mmHg
Ao pk vel: 1.83 m/s
Area-P 1/2: 3.17 cm2
Height: 63 in
S' Lateral: 2.2 cm
Weight: 2758.4 [oz_av]

## 2024-07-26 LAB — BASIC METABOLIC PANEL WITH GFR
Anion gap: 8 (ref 5–15)
BUN: 55 mg/dL — ABNORMAL HIGH (ref 8–23)
CO2: 27 mmol/L (ref 22–32)
Calcium: 8.5 mg/dL — ABNORMAL LOW (ref 8.9–10.3)
Chloride: 101 mmol/L (ref 98–111)
Creatinine, Ser: 1.51 mg/dL — ABNORMAL HIGH (ref 0.44–1.00)
GFR, Estimated: 32 mL/min — ABNORMAL LOW (ref >60)
Glucose, Bld: 109 mg/dL — ABNORMAL HIGH (ref 70–99)
Potassium: 4.6 mmol/L (ref 3.5–5.1)
Sodium: 136 mmol/L (ref 135–145)

## 2024-07-26 LAB — CBC
HCT: 23.5 % — ABNORMAL LOW (ref 36.0–46.0)
Hemoglobin: 7.2 g/dL — ABNORMAL LOW (ref 12.0–15.0)
MCH: 28.9 pg (ref 26.0–34.0)
MCHC: 30.6 g/dL (ref 30.0–36.0)
MCV: 94.4 fL (ref 80.0–100.0)
Platelets: 155 K/uL (ref 150–400)
RBC: 2.49 MIL/uL — ABNORMAL LOW (ref 3.87–5.11)
RDW: 14.5 % (ref 11.5–15.5)
WBC: 11.2 K/uL — ABNORMAL HIGH (ref 4.0–10.5)
nRBC: 0 % (ref 0.0–0.2)

## 2024-07-26 LAB — PREPARE RBC (CROSSMATCH)

## 2024-07-26 LAB — ABO/RH: ABO/RH(D): O POS

## 2024-07-26 MED ORDER — SODIUM CHLORIDE 0.9% IV SOLUTION: Freq: Once | INTRAVENOUS | Status: DC

## 2024-07-26 MED ORDER — ACETAMINOPHEN 325 MG PO TABS: 650.0000 mg | ORAL_TABLET | Freq: Four times a day (QID) | ORAL | Status: DC | PRN

## 2024-07-26 MED ORDER — ASPIRIN 325 MG PO TBEC: 325.0000 mg | DELAYED_RELEASE_TABLET | Freq: Every day | ORAL | Status: DC

## 2024-07-26 NOTE — Progress Notes (Addendum)
 Progress Note    [name] Griffin  FMW:[id number] DOB: 08-09-1928  DOA: 07/23/2024 PCP: Fernand Fredy RAMAN, MD      Brief Narrative:    Medical records reviewed and are as summarized below:  [name] Griffin is a 89 y.o. female with medical history significant of HTN, HLD, diet-controlled DM, gout, CKD-3A, memory loss, hard of hearing, who presented to the emergency department with left hip pain following a fall at home.     Assessment/Plan:   Principal Problem:   Closed left hip fracture (HCC) Active Problems:   Fall at home, initial encounter   Oxygen desaturation   Elevated CK   Essential hypertension, benign   Type II diabetes mellitus with renal manifestations (HCC)   Chronic kidney disease, stage 3a (HCC)   Idiopathic gout   Memory impairment   Syncope   Obesity (BMI 30-39.9)   Acute blood loss anemia   Nutrition Problem: Increased nutrient needs Etiology: post-op healing  Signs/Symptoms: estimated needs   Body mass index is 30.54 kg/m.  (Class I obesity)    Closed left hip fracture and pelvic fracture: Fall at home. Syncope/presyncope. S/p ORIF left hip on 07/24/2024 X-ray showed  comminuted intertrochanteric fracture in the proximal left femur with 8 mm displacement of the lesser trochanter. Patient had episodes of syncope/presyncope at home, etiology is unclear.   Telemetry did not show any arrhythmia 2D echo is pending.   Acute blood loss anemia. Worsening anemia.  Hemoglobin down from 13-10.2-8.1-7.2. She we will be transfused with 1 units of PRBCs today.  This has already been ordered by orthopedic team. Discussed risks and benefits of blood transfusion.  Patient and family are okay with blood transfusion.  Monitor H&H and transfuse as needed.    Oxygen desaturation:  Unknown if this was truly acute hypoxic respiratory failure or oxygen desaturation was due to inaccurate reading from pulse oximetry. She is on 1 L/min oxygen. Wean off oxygen  as able   Elevated CK: CK mildly elevated 432. -pt received 500 cc normal saline This is traumatic from fall.    Comorbidities include hypertension, diet-controlled type II DM, , gout, cognitive impairment.  Suspect CKD stage IIIb rather than CKD stage IIIa       Diet Order             Diet regular Fluid consistency: Thin  Diet effective now                                  Consultants: Orthopedic surgeon  Procedures: ORIF left hip on 07/24/2024    Medications:    sodium chloride    Intravenous Once   sodium chloride    Intravenous Once   allopurinol   100 mg Oral Daily   aspirin  EC  325 mg Oral Daily   atenolol   25 mg Oral Daily   azithromycin   250 mg Oral Daily   vitamin B-12  1,000 mcg Oral Daily   donepezil   10 mg Oral QHS   feeding supplement  237 mL Oral BID BM   furosemide   20 mg Oral Daily   lidocaine  (PF)  10 mL Other Once   lisinopril   5 mg Oral Daily   multivitamin with minerals  1 tablet Oral Daily   triamcinolone  acetonide  80 mg Intra-articular Once   Continuous Infusions:   Anti-infectives (From admission, onward)    Start  Dose/Rate Route Frequency Ordered Stop   07/24/24 1230  ceFAZolin  (ANCEF ) IVPB 2g/100 mL premix  Status:  Discontinued       Note to Pharmacy: To be administered intravenously immediately before surgery and not to be sent to the floor.   2 g 200 mL/hr over 30 Minutes Intravenous Every 6 hours 07/24/24 1224 07/24/24 1716   07/24/24 1000  azithromycin  (ZITHROMAX ) tablet 250 mg       Placed in Followed by Linked Group   250 mg Oral Daily 07/23/24 2030 07/28/24 0959   07/23/24 2130  azithromycin  (ZITHROMAX ) tablet 500 mg       Placed in Followed by Linked Group   500 mg Oral Daily 07/23/24 2030 07/23/24 2223              Family Communication/Anticipated D/C date and plan/Code Status   DVT prophylaxis: SCDs Start: 07/24/24 1538 SCDs Start: 07/23/24 1848     Code Status: Full  Code  Family Communication: Plan discussed with [name], son, and other family members at the bedside Disposition Plan: Plan to discharge to SNF   Status is: Inpatient Remains inpatient appropriate because: Awaiting placement to SNF       Subjective:   Interval events noted.  She has no complaints.  Family at the bedside  Objective:    Vitals:   07/25/24 1600 07/25/24 1950 07/26/24 0442 07/26/24 0737  BP: (!) 102/42 (!) 107/47 117/67 (!) 103/51  Pulse: 69 78 85 72  Resp: 17 16 17 18   Temp: 98.5 F (36.9 C) 97.6 F (36.4 C) 98 F (36.7 C) 97.9 F (36.6 C)  TempSrc: Axillary  Oral Oral  SpO2: 99% 100% 95% 100%  Weight:      Height:       No data found.   Intake/Output Summary (Last 24 hours) at 07/26/2024 1108 Last data filed at 07/26/2024 1100 Gross per 24 hour  Intake 240 ml  Output 100 ml  Net 140 ml   Filed Weights   07/23/24 1106 07/24/24 1122  Weight: 78.2 kg 78.2 kg    Exam:  GEN: NAD SKIN: Warm and dry EYES: No pallor or icterus ENT: MMM, hard of hearing CV: RRR PULM: CTA B ABD: soft, ND, NT, +BS CNS: AAO x 3, non focal EXT: No edema or tenderness      Data Reviewed:   I have personally reviewed following labs and imaging studies:  Labs: Labs show the following:   Basic Metabolic Panel: Recent Labs  Lab 07/23/24 1221 07/24/24 0506 07/25/24 0554 07/26/24 0249  NA 136 138 138 136  K 4.5 4.5 4.5 4.6  CL 97* 104 101 101  CO2 24 27 26 27   GLUCOSE 165* 119* 129* 109*  BUN 25* 30* 39* 55*  CREATININE 1.20* 1.24* 1.41* 1.51*  CALCIUM 9.5 8.7* 8.5* 8.5*  MG  --   --  1.9  --    GFR Estimated Creatinine Clearance: 23 mL/min (A) (by C-G formula based on SCr of 1.51 mg/dL (H)). Liver Function Tests: Recent Labs  Lab 07/23/24 1221  AST 34  ALT 16  ALKPHOS 79  BILITOT 0.8  PROT 7.2  ALBUMIN 3.3*   No results for input(s): LIPASE, AMYLASE in the last 168 hours. No results for input(s): AMMONIA in the last 168  hours. Coagulation profile Recent Labs  Lab 07/23/24 1221  INR 1.2    CBC: Recent Labs  Lab 07/23/24 1136 07/24/24 0506 07/25/24 0554 07/26/24 0249  WBC 11.9* 8.1 11.3*  11.2*  NEUTROABS 10.2*  --   --   --   HGB 13.0 10.2* 8.1* 7.2*  HCT 41.2 32.0* 25.9* 23.5*  MCV 92.0 92.0 93.2 94.4  PLT 221 169 162 155   Cardiac Enzymes: Recent Labs  Lab 07/23/24 1221  CKTOTAL 432*   BNP (last 3 results) No results for input(s): PROBNP in the last 8760 hours. CBG: Recent Labs  Lab 07/23/24 1845 07/24/24 0738 07/24/24 1120 07/24/24 1507 07/25/24 0937  GLUCAP 100* 125* 101* 126* 132*   D-Dimer: No results for input(s): DDIMER in the last 72 hours. Hgb A1c: No results for input(s): HGBA1C in the last 72 hours. Lipid Profile: No results for input(s): CHOL, HDL, LDLCALC, TRIG, CHOLHDL, LDLDIRECT in the last 72 hours. Thyroid function studies: No results for input(s): TSH, T4TOTAL, T3FREE, THYROIDAB in the last 72 hours.  Invalid input(s): FREET3 Anemia work up: Recent Labs    07/23/24 2312 07/25/24 0554  VITAMINB12 219  --   FERRITIN  --  144   Sepsis Labs: Recent Labs  Lab 07/23/24 1136 07/23/24 2312 07/24/24 0506 07/25/24 0554 07/26/24 0249  PROCALCITON  --  <0.10  --   --   --   WBC 11.9*  --  8.1 11.3* 11.2*    Microbiology Recent Results (from the past 240 hours)  Resp panel by RT-PCR (RSV, Flu A&B, Covid) Anterior Nasal Swab     Status: None   Collection Time: 07/23/24  9:53 PM   Specimen: Anterior Nasal Swab  Result Value Ref Range Status   SARS Coronavirus 2 by RT PCR NEGATIVE NEGATIVE Final    Comment: (NOTE) SARS-CoV-2 target nucleic acids are NOT DETECTED.  The SARS-CoV-2 RNA is generally detectable in upper respiratory specimens during the acute phase of infection. The lowest concentration of SARS-CoV-2 viral copies this assay can detect is 138 copies/mL. A negative result does not preclude SARS-Cov-2 infection  and should not be used as the sole basis for treatment or other patient management decisions. A negative result may occur with  improper specimen collection/handling, submission of specimen other than nasopharyngeal swab, presence of viral mutation(s) within the areas targeted by this assay, and inadequate number of viral copies(<138 copies/mL). A negative result must be combined with clinical observations, patient history, and epidemiological information. The expected result is Negative.  Fact Sheet for Patients:  BloggerCourse.com  Fact Sheet for Healthcare Providers:  SeriousBroker.it  This test is no t yet approved or cleared by the [place]  FDA and  has been authorized for detection and/or diagnosis of SARS-CoV-2 by FDA under an Emergency Use Authorization (EUA). This EUA will remain  in effect (meaning this test can be used) for the duration of the COVID-19 declaration under Section 564(b)(1) of the Act, 21 U.S.C.section 360bbb-3(b)(1), unless the authorization is terminated  or revoked sooner.       Influenza A by PCR NEGATIVE NEGATIVE Final   Influenza B by PCR NEGATIVE NEGATIVE Final    Comment: (NOTE) The Xpert Xpress SARS-CoV-2/FLU/RSV plus assay is intended as an aid in the diagnosis of influenza from Nasopharyngeal swab specimens and should not be used as a sole basis for treatment. Nasal washings and aspirates are unacceptable for Xpert Xpress SARS-CoV-2/FLU/RSV testing.  Fact Sheet for Patients: BloggerCourse.com  Fact Sheet for Healthcare Providers: SeriousBroker.it  This test is not yet approved or cleared by the [place]  FDA and has been authorized for detection and/or diagnosis of SARS-CoV-2 by FDA under an Emergency Use Authorization (EUA). This  EUA will remain in effect (meaning this test can be used) for the duration of the COVID-19 declaration  under Section 564(b)(1) of the Act, 21 U.S.C. section 360bbb-3(b)(1), unless the authorization is terminated or revoked.     Resp Syncytial Virus by PCR NEGATIVE NEGATIVE Final    Comment: (NOTE) Fact Sheet for Patients: BloggerCourse.com  Fact Sheet for Healthcare Providers: SeriousBroker.it  This test is not yet approved or cleared by the [place]  FDA and has been authorized for detection and/or diagnosis of SARS-CoV-2 by FDA under an Emergency Use Authorization (EUA). This EUA will remain in effect (meaning this test can be used) for the duration of the COVID-19 declaration under Section 564(b)(1) of the Act, 21 U.S.C. section 360bbb-3(b)(1), unless the authorization is terminated or revoked.  Performed at [facility], [address] [place], [place] [address]     Procedures and diagnostic studies:  DG Knee Right Port Result Date: 07/25/2024 CLINICAL DATA:  Recent fall with knee pain, initial encounter EXAM: PORTABLE RIGHT KNEE - 1-2 VIEW COMPARISON:  None Available. FINDINGS: Degenerative changes are noted worst in the medial joint space. Minimal joint effusion is seen. No fracture or dislocation is noted. IMPRESSION: Degenerative change without acute abnormality. Electronically Signed   By: [name] Devonshire M.D.   On: 07/25/2024 22:05   DG Knee Left Port Result Date: 07/25/2024 CLINICAL DATA:  Recent fall with knee pain, initial encounter EXAM: PORTABLE LEFT KNEE - 2 VIEW COMPARISON:  None Available. FINDINGS: Postsurgical changes are noted in the distal left femur. Severe degenerative changes of the knee joint are noted particularly in the medial joint space. No joint effusion is seen. No acute fracture is noted. IMPRESSION: Degenerative changes without acute abnormality. Electronically Signed   By: [name] Devonshire M.D.   On: 07/25/2024 22:05   DG HIP UNILAT WITH PELVIS 2-3 VIEWS LEFT Result Date: 07/24/2024 CLINICAL  DATA:  Elective surgery peer EXAM: DG HIP (WITH OR WITHOUT PELVIS) 2-3V LEFT COMPARISON:  Preoperative radiograph FINDINGS: Four fluoroscopic spot views of the hip and femur submitted from the operating room. Sidedness is not labeled on submitted exams. Femoral intramedullary nail with distal and trans trochanteric screw fixation traverse proximal femur fracture. Fluoroscopy time 2 minutes 7 seconds. Dose 20.5 mGy. IMPRESSION: Intraoperative fluoroscopy during proximal femur fracture ORIF. Electronically Signed   By: Andrea Gasman M.D.   On: 07/24/2024 15:36   DG C-Arm 1-60 Min-No Report Result Date: 07/24/2024 Fluoroscopy was utilized by the requesting physician.  No radiographic interpretation.   DG C-Arm 1-60 Min-No Report Result Date: 07/24/2024 Fluoroscopy was utilized by the requesting physician.  No radiographic interpretation.               LOS: 3 days   Kaeleen Odom  [facility]   Pager on [email or web address]. If 7PM-7AM, please contact night-coverage at [email or web address]     07/26/2024, 11:08 AM

## 2024-07-26 NOTE — Progress Notes (Signed)
 Physical Therapy Treatment Patient Details Name: [name] Griffin MRN: [id number] DOB: Feb 20, 1928 Today's Date: 07/26/2024   History of Present Illness Pt is a 89 y.o. female presenting to hospital 07/23/24 s/p unwitnessed fall; c/o L hip pain; per son pt with multiple brief episodes of possible syncope (described as suddenly stopped talking, with confusion and eye staring lasting for a few seconds).  Imaging showing acute comminuted L femoral intra trochanteric fx and healed/healing R superior and inferior pubic rami fx's; possible healing R sacral ala fx's.  Pt admitted with closed L hip fx, pelvic fx, s/p fall, oxygen desaturation, elevated CK, and syncope.  S/p ORIF L hip 07/24/24.  PMH includes DM, htn, CKD, gout, HOH, memory loss (h/o mild dementia).    PT Comments  Pt was long sitting in bed upon arrival. She is alert and O x 2. Very pleasant and cooperative but severely HOH. Needs increased time to perform all task and extensive max assist for all mobility and transfers. Pt was able to exit R side of bed prior to standing 3 x to RW. On fourth STS, pt performed stand pivot to recliner from EOB without use of RW + author blocking knees to prevent buckling. Overall, pt tolerated session well. She is progressing well towards all PT goals. DC recs remain appropriate to maximize her independence and safety with all ADLs.    If plan is discharge home, recommend the following: Two people to help with walking and/or transfers;A lot of help with bathing/dressing/bathroom;Assistance with cooking/housework;Direct supervision/assist for medications management;Assist for transportation;Help with stairs or ramp for entrance;Supervision due to cognitive status     Equipment Recommendations  Other (comment) (Defer to next level of care)       Precautions / Restrictions Precautions Precautions: Fall Recall of Precautions/Restrictions: Impaired Restrictions Weight Bearing Restrictions Per Provider Order:  Yes LLE Weight Bearing Per Provider Order: Weight bearing as tolerated Other Position/Activity Restrictions: WBAT L LE with walker     Mobility  Bed Mobility Overal bed mobility: Needs Assistance Bed Mobility: Supine to Sit  Supine to sit: Max assist, HOB elevated  General bed mobility comments: Max assist + increased time to exit bed. pt struggles with LLE progression    Transfers Overall transfer level: Needs assistance Equipment used: Rolling walker (2 wheels) Transfers: Sit to/from Stand, Bed to chair/wheelchair/BSC Sit to Stand: Max assist, From elevated surface Stand pivot transfers: Max assist, From elevated surface  General transfer comment: Pt was able to stand 3 x total to RW prior to stand pivot to recliner without use of RW. Pt is able t stand but unable to clear floor to actually take steps.    Ambulation/Gait  General Gait Details: Unable to ambulate   Balance Overall balance assessment: Needs assistance Sitting-balance support: Bilateral upper extremity supported, Feet supported Sitting balance-Leahy Scale: Fair     Standing balance support: Bilateral upper extremity supported, Reliant on assistive device for balance Standing balance-Leahy Scale: Poor Standing balance comment: Pt was able to stand without BUE support x ~ 20 sec each trial of EOB standing      Communication Communication Communication: Impaired Factors Affecting Communication: Hearing impaired  Cognition Arousal: Alert Behavior During Therapy: WFL for tasks assessed/performed   PT - Cognitive impairments: History of cognitive impairments    PT - Cognition Comments: Pt is A and O x 2. pleasantly confused. Extremely HOH Following commands: Impaired Following commands impaired: Follows one step commands with increased time    Cueing Cueing Techniques: Verbal  cues, Tactile cues, Gestural cues, Visual cues         Pertinent Vitals/Pain Pain Assessment Pain Assessment: PAINAD Breathing:  normal Negative Vocalization: none Facial Expression: smiling or inexpressive Body Language: relaxed Consolability: no need to console PAINAD Score: 0 Pain Location: L hip/thigh and headache Pain Descriptors / Indicators: Aching, Discomfort, Guarding, Headache Pain Intervention(s): Limited activity within patient's tolerance, Monitored during session, Premedicated before session, Repositioned     PT Goals (current goals can now be found in the care plan section) Acute Rehab PT Goals Patient Stated Goal: walk again Progress towards PT goals: Progressing toward goals    Frequency    7X/week       AM-PAC PT 6 Clicks Mobility   Outcome Measure  Help needed turning from your back to your side while in a flat bed without using bedrails?: A Lot Help needed moving from lying on your back to sitting on the side of a flat bed without using bedrails?: A Lot Help needed moving to and from a bed to a chair (including a wheelchair)?: A Lot Help needed standing up from a chair using your arms (e.g., wheelchair or bedside chair)?: A Lot Help needed to walk in hospital room?: Total Help needed climbing 3-5 steps with a railing? : Total 6 Click Score: 10    End of Session Equipment Utilized During Treatment: Oxygen Activity Tolerance: Patient tolerated treatment well Patient left: in chair;with call bell/phone within reach;with chair alarm set Nurse Communication: Mobility status PT Visit Diagnosis: Other abnormalities of gait and mobility (R26.89);Muscle weakness (generalized) (M62.81);History of falling (Z91.81);Pain Pain - Right/Left: Left Pain - part of body: Hip     Time: 9253-9185 PT Time Calculation (min) (ACUTE ONLY): 28 min  Charges:    $Therapeutic Activity: 23-37 mins PT General Charges $$ ACUTE PT VISIT: 1 Visit                     Rankin Essex PTA 07/26/24, 8:32 AM

## 2024-07-26 NOTE — Progress Notes (Addendum)
 Subjective: 2 Days Post-Op Procedure(s) (LRB): OPEN REDUCTION INTERNAL FIXATION HIP (Left) Patient reports left hip pain as mild.  Bilateral knee pain moderate.  History of chronic bilateral knee osteoarthritis Difficult historian. Hx of dementia. Slow progress of physical therapy. Patient with history of bilateral knee osteoarthritis, x-rays obtained yesterday of the left and right knees show advanced bilateral knee osteoarthritis with complete loss of joint space in the medial compartment of both knees.  No history of recent injections into her knees.  Patient and son requesting injections into knees to help alleviate bilateral knee pain   Objective: Vital signs in last 24 hours: Temp:  [97.6 F (36.4 C)-98.5 F (36.9 C)] 97.9 F (36.6 C) (08/13 0737) Pulse Rate:  [69-85] 72 (08/13 0737) Resp:  [16-18] 18 (08/13 0737) BP: (102-117)/(42-67) 103/51 (08/13 0737) SpO2:  [95 %-100 %] 100 % (08/13 0737)  Intake/Output from previous day: 08/12 0701 - 08/13 0700 In: 0  Out: 100 [Urine:100] Intake/Output this shift: Total I/O In: 240 [P.O.:240] Out: -   Recent Labs    07/24/24 0506 07/25/24 0554 07/26/24 0249  HGB 10.2* 8.1* 7.2*   Recent Labs    07/25/24 0554 07/26/24 0249  WBC 11.3* 11.2*  RBC 2.78* 2.49*  HCT 25.9* 23.5*  PLT 162 155   Recent Labs    07/25/24 0554 07/26/24 0249  NA 138 136  K 4.5 4.6  CL 101 101  CO2 26 27  BUN 39* 55*  CREATININE 1.41* 1.51*  GLUCOSE 129* 109*  CALCIUM 8.5* 8.5*   No results for input(s): LABPT, INR in the last 72 hours. X-rays of the left and right knee reviewed by me today from 07/25/2024 showed complete loss of joint space in the medial compartment of the left and right knee.  No evidence of acute bony abnormality.  EXAM General - Patient is Alert, Appropriate, and Oriented Left hip/lower extremity - Sensation intact distally Intact pulses distally Dorsiflexion/Plantar flexion intact Dressing - dressing C/D/I  and no drainage Motor Function - intact, moving foot and toes well on exam.  Bilateral knees -mild swelling.  No noticeable effusion.  No skin breakdown noted.  Able to straight leg raise.  Pain with flexion past 90 degrees.  0 to 95 degrees range of motion bilaterally.  Stable to valgus and varus stress testing.  No defect in quad tendon or patellar tendon.  Past Medical History:  Diagnosis Date   CKD (chronic kidney disease)    Diabetes mellitus without complication (HCC)    Essential hypertension, benign    Mixed hyperlipidemia     Assessment/Plan:   2 Days Post-Op Procedure(s) (LRB): OPEN REDUCTION INTERNAL FIXATION HIP (Left) Principal Problem:   Closed left hip fracture (HCC) Active Problems:   Idiopathic gout   Essential hypertension, benign   Memory impairment   Fall at home, initial encounter   Type II diabetes mellitus with renal manifestations (HCC)   Chronic kidney disease, stage 3a (HCC)   Oxygen desaturation   Obesity (BMI 30-39.9)   Elevated CK   Syncope   Acute blood loss anemia  Estimated body mass index is 30.54 kg/m as calculated from the following:   Height as of this encounter: 5' 3 (1.6 m).   Weight as of this encounter: 78.2 kg. Advance diet Up with therapy Pain well controlled Hgb 7.2.  Transfuse 1 unit packed red blood cells today.  Recheck hemoglobin this afternoon and tomorrow morning.  Bilateral knee osteoarthritis.  Patient agreed and consented to bilateral  knee intra-articular cortisone injections today.  Skin was prepped with chlorhexidine and alcohol.  A 25-gauge 1-1/2 inch needle was used to place 3 cc of lidocaine  1%, 3 cc of 0.5% bupivacaine  and 1 cc of Kenalog  40 mg into both knees.  Patient tolerated procedure well.  Band-Aid applied.  Pain well-controlled with Tylenol .  Follow up with [facility] in 2 weeks  DVT Prophylaxis - Aspirin , TED hose, and SCDs Weight-Bearing as tolerated to left leg   T. Medford Amber, PA-C [facility] 07/26/2024, 12:21 PM

## 2024-07-26 NOTE — Care Management Important Message (Signed)
 Important Message  Patient Details  Name: [name] Griffin MRN: [id number] Date of Birth: October 09, 1928   Important Message Given:  Yes - Medicare IM     Rojelio [name] 07/26/2024, 3:56 PM

## 2024-07-27 ENCOUNTER — Ambulatory Visit: Payer: Medicare HMO | Admitting: Internal Medicine

## 2024-07-27 DIAGNOSIS — S72002A Fracture of unspecified part of neck of left femur, initial encounter for closed fracture: Secondary | ICD-10-CM | POA: Diagnosis not present

## 2024-07-27 LAB — CBC
HCT: 29.7 % — ABNORMAL LOW (ref 36.0–46.0)
Hemoglobin: 9.9 g/dL — ABNORMAL LOW (ref 12.0–15.0)
MCH: 30.2 pg (ref 26.0–34.0)
MCHC: 33.3 g/dL (ref 30.0–36.0)
MCV: 90.5 fL (ref 80.0–100.0)
Platelets: 175 K/uL (ref 150–400)
RBC: 3.28 MIL/uL — ABNORMAL LOW (ref 3.87–5.11)
RDW: 14.3 % (ref 11.5–15.5)
WBC: 10.7 K/uL — ABNORMAL HIGH (ref 4.0–10.5)
nRBC: 0 % (ref 0.0–0.2)

## 2024-07-27 LAB — BPAM RBC
Blood Product Expiration Date: 202508302359
ISSUE DATE / TIME: 202508131623
Unit Type and Rh: 5100

## 2024-07-27 LAB — TYPE AND SCREEN
ABO/RH(D): O POS
Antibody Screen: NEGATIVE
Unit division: 0

## 2024-07-27 LAB — BASIC METABOLIC PANEL WITH GFR
Anion gap: 7 (ref 5–15)
BUN: 50 mg/dL — ABNORMAL HIGH (ref 8–23)
CO2: 28 mmol/L (ref 22–32)
Calcium: 8.8 mg/dL — ABNORMAL LOW (ref 8.9–10.3)
Chloride: 101 mmol/L (ref 98–111)
Creatinine, Ser: 1.18 mg/dL — ABNORMAL HIGH (ref 0.44–1.00)
GFR, Estimated: 43 mL/min — ABNORMAL LOW (ref >60)
Glucose, Bld: 164 mg/dL — ABNORMAL HIGH (ref 70–99)
Potassium: 5.1 mmol/L (ref 3.5–5.1)
Sodium: 136 mmol/L (ref 135–145)

## 2024-07-27 NOTE — Progress Notes (Signed)
 Progress Note    [name] Griffin  FMW:[id number] DOB: [birth year]  DOA: 07/23/2024 PCP: Fernand Fredy RAMAN, MD      Brief Narrative:    Medical records reviewed and are as summarized below:  [name] Griffin is a 89 y.o. female with medical history significant of HTN, HLD, diet-controlled DM, gout, CKD-3A, memory loss, hard of hearing, who presented to the emergency department with left hip pain following a fall at home.     Assessment/Plan:   Principal Problem:   Closed left hip fracture (HCC) Active Problems:   Fall at home, initial encounter   Oxygen desaturation   Elevated CK   Essential hypertension, benign   Type II diabetes mellitus with renal manifestations (HCC)   Chronic kidney disease, stage 3a (HCC)   Idiopathic gout   Memory impairment   Syncope   Obesity (BMI 30-39.9)   Acute blood loss anemia   Nutrition Problem: Increased nutrient needs Etiology: post-op healing  Signs/Symptoms: estimated needs   Body mass index is 30.54 kg/m.  (Class I obesity)    Closed left hip fracture and pelvic fracture: Fall at home. Syncope/presyncope. S/p ORIF left hip on 07/24/2024 X-ray showed  comminuted intertrochanteric fracture in the proximal left femur with 8 mm displacement of the lesser trochanter. Patient had episodes of syncope/presyncope at home, etiology is unclear.   Telemetry did not show any arrhythmia 2D echo showed EF estimated at 60 to 65%, mild LVH, grade 1 diastolic dysfunction, moderately elevated pulmonary artery systolic pressure, mild to moderate TR   Acute postoperative blood loss anemia. Hemoglobin up from 7.2-9.9.  S/p transfusion and drainage of PRBCs on 07/26/2024. Hemoglobin down from 13-10.2-8.1-7.2.   Oxygen desaturation:  Unknown if this was truly acute hypoxic respiratory failure or oxygen desaturation was due to inaccurate reading from pulse oximetry. She is on 1 L/min oxygen. Wean off oxygen as able   Elevated CK: CK mildly  elevated 432. -pt received 500 cc normal saline This is traumatic from fall.   Probable AKI on CKD stage IIIa: Fluctuating creatinine and difficult to determine exact stage of kidney disease    Comorbidities include hypertension, diet-controlled type II DM, , gout, cognitive impairment.       Diet Order             Diet regular Fluid consistency: Thin  Diet effective now                                  Consultants: Orthopedic surgeon  Procedures: ORIF left hip on 07/24/2024    Medications:    sodium chloride    Intravenous Once   sodium chloride    Intravenous Once   allopurinol   100 mg Oral Daily   aspirin  EC  325 mg Oral Daily   atenolol   25 mg Oral Daily   vitamin B-12  1,000 mcg Oral Daily   donepezil   10 mg Oral QHS   feeding supplement  237 mL Oral BID BM   furosemide   20 mg Oral Daily   lidocaine  (PF)  10 mL Other Once   lisinopril   5 mg Oral Daily   multivitamin with minerals  1 tablet Oral Daily   triamcinolone  acetonide  80 mg Intra-articular Once   Continuous Infusions:   Anti-infectives (From admission, onward)    Start     Dose/Rate Route Frequency Ordered Stop   07/24/24 1230  ceFAZolin  (  ANCEF ) IVPB 2g/100 mL premix  Status:  Discontinued       Note to Pharmacy: To be administered intravenously immediately before surgery and not to be sent to the floor.   2 g 200 mL/hr over 30 Minutes Intravenous Every 6 hours 07/24/24 1224 07/24/24 1716   07/24/24 1000  azithromycin  (ZITHROMAX ) tablet 250 mg       Placed in Followed by Linked Group   250 mg Oral Daily 07/23/24 2030 07/27/24 0937   07/23/24 2130  azithromycin  (ZITHROMAX ) tablet 500 mg       Placed in Followed by Linked Group   500 mg Oral Daily 07/23/24 2030 07/23/24 2223              Family Communication/Anticipated D/C date and plan/Code Status   DVT prophylaxis: SCDs Start: 07/24/24 1538 SCDs Start: 07/23/24 1848     Code Status: Full Code  Family  Communication: Plan discussed with [name], son, at the bedside Disposition Plan: Plan to discharge to SNF   Status is: Inpatient Remains inpatient appropriate because: Awaiting placement to SNF       Subjective:   Interval events noted.  She has no complaints.  [name], son, was at the bedside  Objective:    Vitals:   07/26/24 1910 07/26/24 1956 07/27/24 0458 07/27/24 0834  BP: (!) 117/38 (!) 95/43 117/62 (!) 116/54  Pulse: 73 70 74 64  Resp: 18 18 16 17   Temp: 97.7 F (36.5 C) 98.3 F (36.8 C) 97.7 F (36.5 C) 97.8 F (36.6 C)  TempSrc: Oral   Oral  SpO2: 96% 96% 100% 99%  Weight:      Height:       No data found.   Intake/Output Summary (Last 24 hours) at 07/27/2024 1501 Last data filed at 07/27/2024 1348 Gross per 24 hour  Intake 742 ml  Output 550 ml  Net 192 ml   Filed Weights   07/23/24 1106 07/24/24 1122  Weight: 78.2 kg 78.2 kg    Exam:  GEN: NAD SKIN: Warm and dry EYES: No pallor or icterus ENT: MMM, hard of hearing CV: RRR PULM: CTA B ABD: soft, ND, NT, +BS CNS: AAO x person, place and situation, non focal EXT: No edema or tenderness       Data Reviewed:   I have personally reviewed following labs and imaging studies:  Labs: Labs show the following:   Basic Metabolic Panel: Recent Labs  Lab 07/23/24 1221 07/24/24 0506 07/25/24 0554 07/26/24 0249 07/27/24 0622  NA 136 138 138 136 136  K 4.5 4.5 4.5 4.6 5.1  CL 97* 104 101 101 101  CO2 24 27 26 27 28   GLUCOSE 165* 119* 129* 109* 164*  BUN 25* 30* 39* 55* 50*  CREATININE 1.20* 1.24* 1.41* 1.51* 1.18*  CALCIUM 9.5 8.7* 8.5* 8.5* 8.8*  MG  --   --  1.9  --   --    GFR Estimated Creatinine Clearance: 29.5 mL/min (A) (by C-G formula based on SCr of 1.18 mg/dL (H)). Liver Function Tests: Recent Labs  Lab 07/23/24 1221  AST 34  ALT 16  ALKPHOS 79  BILITOT 0.8  PROT 7.2  ALBUMIN 3.3*   No results for input(s): LIPASE, AMYLASE in the last 168 hours. No results for  input(s): AMMONIA in the last 168 hours. Coagulation profile Recent Labs  Lab 07/23/24 1221  INR 1.2    CBC: Recent Labs  Lab 07/23/24 1136 07/24/24 0506 07/25/24 0554 07/26/24 0249 07/27/24  0622  WBC 11.9* 8.1 11.3* 11.2* 10.7*  NEUTROABS 10.2*  --   --   --   --   HGB 13.0 10.2* 8.1* 7.2* 9.9*  HCT 41.2 32.0* 25.9* 23.5* 29.7*  MCV 92.0 92.0 93.2 94.4 90.5  PLT 221 169 162 155 175   Cardiac Enzymes: Recent Labs  Lab 07/23/24 1221  CKTOTAL 432*   BNP (last 3 results) No results for input(s): PROBNP in the last 8760 hours. CBG: Recent Labs  Lab 07/23/24 1845 07/24/24 0738 07/24/24 1120 07/24/24 1507 07/25/24 0937  GLUCAP 100* 125* 101* 126* 132*   D-Dimer: No results for input(s): DDIMER in the last 72 hours. Hgb A1c: No results for input(s): HGBA1C in the last 72 hours. Lipid Profile: No results for input(s): CHOL, HDL, LDLCALC, TRIG, CHOLHDL, LDLDIRECT in the last 72 hours. Thyroid function studies: No results for input(s): TSH, T4TOTAL, T3FREE, THYROIDAB in the last 72 hours.  Invalid input(s): FREET3 Anemia work up: Recent Labs    07/25/24 0554  FERRITIN 144   Sepsis Labs: Recent Labs  Lab 07/23/24 2312 07/24/24 0506 07/25/24 0554 07/26/24 0249 07/27/24 0622  PROCALCITON <0.10  --   --   --   --   WBC  --  8.1 11.3* 11.2* 10.7*    Microbiology Recent Results (from the past 240 hours)  Resp panel by RT-PCR (RSV, Flu A&B, Covid) Anterior Nasal Swab     Status: None   Collection Time: 07/23/24  9:53 PM   Specimen: Anterior Nasal Swab  Result Value Ref Range Status   SARS Coronavirus 2 by RT PCR NEGATIVE NEGATIVE Final    Comment: (NOTE) SARS-CoV-2 target nucleic acids are NOT DETECTED.  The SARS-CoV-2 RNA is generally detectable in upper respiratory specimens during the acute phase of infection. The lowest concentration of SARS-CoV-2 viral copies this assay can detect is 138 copies/mL. A negative result  does not preclude SARS-Cov-2 infection and should not be used as the sole basis for treatment or other patient management decisions. A negative result may occur with  improper specimen collection/handling, submission of specimen other than nasopharyngeal swab, presence of viral mutation(s) within the areas targeted by this assay, and inadequate number of viral copies(<138 copies/mL). A negative result must be combined with clinical observations, patient history, and epidemiological information. The expected result is Negative.  Fact Sheet for Patients:  BloggerCourse.com  Fact Sheet for Healthcare Providers:  SeriousBroker.it  This test is no t yet approved or cleared by the [place]  FDA and  has been authorized for detection and/or diagnosis of SARS-CoV-2 by FDA under an Emergency Use Authorization (EUA). This EUA will remain  in effect (meaning this test can be used) for the duration of the COVID-19 declaration under Section 564(b)(1) of the Act, 21 U.S.C.section 360bbb-3(b)(1), unless the authorization is terminated  or revoked sooner.       Influenza A by PCR NEGATIVE NEGATIVE Final   Influenza B by PCR NEGATIVE NEGATIVE Final    Comment: (NOTE) The Xpert Xpress SARS-CoV-2/FLU/RSV plus assay is intended as an aid in the diagnosis of influenza from Nasopharyngeal swab specimens and should not be used as a sole basis for treatment. Nasal washings and aspirates are unacceptable for Xpert Xpress SARS-CoV-2/FLU/RSV testing.  Fact Sheet for Patients: BloggerCourse.com  Fact Sheet for Healthcare Providers: SeriousBroker.it  This test is not yet approved or cleared by the [place]  FDA and has been authorized for detection and/or diagnosis of SARS-CoV-2 by FDA under an Emergency Use  Authorization (EUA). This EUA will remain in effect (meaning this test can be used) for  the duration of the COVID-19 declaration under Section 564(b)(1) of the Act, 21 U.S.C. section 360bbb-3(b)(1), unless the authorization is terminated or revoked.     Resp Syncytial Virus by PCR NEGATIVE NEGATIVE Final    Comment: (NOTE) Fact Sheet for Patients: BloggerCourse.com  Fact Sheet for Healthcare Providers: SeriousBroker.it  This test is not yet approved or cleared by the [place]  FDA and has been authorized for detection and/or diagnosis of SARS-CoV-2 by FDA under an Emergency Use Authorization (EUA). This EUA will remain in effect (meaning this test can be used) for the duration of the COVID-19 declaration under Section 564(b)(1) of the Act, 21 U.S.C. section 360bbb-3(b)(1), unless the authorization is terminated or revoked.  Performed at [facility], [address] [place], [place] [address]     Procedures and diagnostic studies:  ECHOCARDIOGRAM COMPLETE Result Date: 07/26/2024    ECHOCARDIOGRAM REPORT   Patient Name:   DANESHA KIRCHOFF [facility] Date of Exam: 07/25/2024 Medical Rec #:  [id number]       Height:       63.0 in Accession #:    [id number]      Weight:       172.4 lb Date of Birth:  [birth year]       BSA:          1.815 m Patient Age:    89 years        BP:           98/39 mmHg Patient Gender: F               HR:           69 bpm. Exam Location:  [facility] Procedure: 2D Echo, Cardiac Doppler and Color Doppler (Both Spectral and Color            Flow Doppler were utilized during procedure). Indications:     Syncope R55  History:         Patient has no prior history of Echocardiogram examinations.                  CKD, stage 3, Signs/Symptoms:Syncope; Risk                  Factors:Hypertension, Diabetes and Dyslipidemia.  Sonographer:     Thea Norlander RCS Referring Phys:  [id number] MURVIN MANA Diagnosing Phys: Evalene Lunger MD IMPRESSIONS  1. Left ventricular ejection fraction, by estimation, is 60 to 65%. The left  ventricle has normal function. The left ventricle has no regional wall motion abnormalities. There is mild left ventricular hypertrophy. Left ventricular diastolic parameters are consistent with Grade I diastolic dysfunction (impaired relaxation).  2. Right ventricular systolic function is normal. The right ventricular size is normal. There is moderately elevated pulmonary artery systolic pressure. The estimated right ventricular systolic pressure is 45.4 mmHg.  3. The mitral valve is normal in structure. No evidence of mitral valve regurgitation. No evidence of mitral stenosis.  4. Tricuspid valve regurgitation is mild to moderate.  5. The aortic valve is normal in structure. Aortic valve regurgitation is not visualized. No aortic stenosis is present.  6. The inferior vena cava is normal in size with greater than 50% respiratory variability, suggesting right atrial pressure of 3 mmHg. FINDINGS  Left Ventricle: Left ventricular ejection fraction, by estimation, is 60 to 65%. The left ventricle has normal function. The left ventricle has no regional  wall motion abnormalities. Strain was performed and the global longitudinal strain is indeterminate. The left ventricular internal cavity size was normal in size. There is mild left ventricular hypertrophy. Left ventricular diastolic parameters are consistent with Grade I diastolic dysfunction (impaired relaxation). Right Ventricle: The right ventricular size is normal. No increase in right ventricular wall thickness. Right ventricular systolic function is normal. There is moderately elevated pulmonary artery systolic pressure. The tricuspid regurgitant velocity is 3.18 m/s, and with an assumed right atrial pressure of 5 mmHg, the estimated right ventricular systolic pressure is 45.4 mmHg. Left Atrium: Left atrial size was normal in size. Right Atrium: Right atrial size was normal in size. Pericardium: There is no evidence of pericardial effusion. Mitral Valve: The mitral  valve is normal in structure. No evidence of mitral valve regurgitation. No evidence of mitral valve stenosis. Tricuspid Valve: The tricuspid valve is normal in structure. Tricuspid valve regurgitation is mild to moderate. No evidence of tricuspid stenosis. Aortic Valve: The aortic valve is normal in structure. Aortic valve regurgitation is not visualized. No aortic stenosis is present. Aortic valve peak gradient measures 13.4 mmHg. Pulmonic Valve: The pulmonic valve was normal in structure. Pulmonic valve regurgitation is not visualized. No evidence of pulmonic stenosis. Aorta: The aortic root is normal in size and structure. Venous: The inferior vena cava is normal in size with greater than 50% respiratory variability, suggesting right atrial pressure of 3 mmHg. IAS/Shunts: No atrial level shunt detected by color flow Doppler. Additional Comments: 3D was performed not requiring image post processing on an independent workstation and was indeterminate.  LEFT VENTRICLE PLAX 2D LVIDd:         3.20 cm   Diastology LVIDs:         2.20 cm   LV e' medial:    5.00 cm/s LV PW:         1.20 cm   LV E/e' medial:  20.6 LV IVS:        1.00 cm   LV e' lateral:   7.18 cm/s LVOT diam:     2.00 cm   LV E/e' lateral: 14.3 LV SV:         85 LV SV Index:   47 LVOT Area:     3.14 cm  RIGHT VENTRICLE             IVC RV S prime:     12.10 cm/s  IVC diam: 1.80 cm TAPSE (M-mode): 2.2 cm LEFT ATRIUM             Index        RIGHT ATRIUM          Index LA diam:        2.50 cm 1.38 cm/m   RA Area:     8.16 cm LA Vol (A2C):   30.8 ml 16.97 ml/m  RA Volume:   13.70 ml 7.55 ml/m LA Vol (A4C):   34.8 ml 19.17 ml/m LA Biplane Vol: 32.8 ml 18.07 ml/m  AORTIC VALVE AV Area (Vmax): 2.15 cm AV Vmax:        183.00 cm/s AV Peak Grad:   13.4 mmHg LVOT Vmax:      125.00 cm/s LVOT Vmean:     84.200 cm/s LVOT VTI:       0.269 m  AORTA Ao Root diam: 3.30 cm Ao Asc diam:  3.30 cm MITRAL VALVE                TRICUSPID VALVE  MV Area (PHT): 3.17 cm      TR Peak grad:   40.4 mmHg MV Decel Time: 239 msec     TR Vmax:        318.00 cm/s MV E velocity: 103.00 cm/s MV A velocity: 114.00 cm/s  SHUNTS MV E/A ratio:  0.90         Systemic VTI:  0.27 m                             Systemic Diam: 2.00 cm Evalene Lunger MD Electronically signed by Evalene Lunger MD Signature Date/Time: 07/26/2024/4:39:33 PM    Final    DG Knee Right Port Result Date: 07/25/2024 CLINICAL DATA:  Recent fall with knee pain, initial encounter EXAM: PORTABLE RIGHT KNEE - 1-2 VIEW COMPARISON:  None Available. FINDINGS: Degenerative changes are noted worst in the medial joint space. Minimal joint effusion is seen. No fracture or dislocation is noted. IMPRESSION: Degenerative change without acute abnormality. Electronically Signed   By: [name] Devonshire M.D.   On: 07/25/2024 22:05   DG Knee Left Port Result Date: 07/25/2024 CLINICAL DATA:  Recent fall with knee pain, initial encounter EXAM: PORTABLE LEFT KNEE - 2 VIEW COMPARISON:  None Available. FINDINGS: Postsurgical changes are noted in the distal left femur. Severe degenerative changes of the knee joint are noted particularly in the medial joint space. No joint effusion is seen. No acute fracture is noted. IMPRESSION: Degenerative changes without acute abnormality. Electronically Signed   By: [name] Devonshire M.D.   On: 07/25/2024 22:05               LOS: 4 days   Rami Waddle  [facility]   Pager on [email or web address]. If 7PM-7AM, please contact night-coverage at [email or web address]     07/27/2024, 3:01 PM

## 2024-07-27 NOTE — Progress Notes (Signed)
 Physical Therapy Treatment Patient Details Name: [name] Griffin MRN: [id number] DOB: 05/01/1928 Today's Date: 07/27/2024   History of Present Illness Pt is a 89 y.o. female presenting to hospital 07/23/24 s/p unwitnessed fall; c/o L hip pain; per son pt with multiple brief episodes of possible syncope (described as suddenly stopped talking, with confusion and eye staring lasting for a few seconds).  Imaging showing acute comminuted L femoral intra trochanteric fx and healed/healing R superior and inferior pubic rami fx's; possible healing R sacral ala fx's.  Pt admitted with closed L hip fx, pelvic fx, s/p fall, oxygen desaturation, elevated CK, and syncope.  S/p ORIF L hip 07/24/24.  PMH includes DM, htn, CKD, gout, HOH, memory loss (h/o mild dementia).    PT Comments  Pt was long sitting in bed upon arrival. She is alert but pleasantly confused. Pt is severely HOH but does hear slightly better out of R ear. She requires max assist to roll L and R for bed linen change prior to logroll R to short sit. Increased time and vcs throughout. Pt tolerated standing at EOB and taking 2 very antalgic step to steps to recliner from EOB. Slight L knee buckling with RLE progression. Improved wt acceptance on operative LE. Pt was repositioning in recliner at conclusion of session. HEP issued with instructions for performance. Son and his girlfriend were assisting with HEP. DC recs remain appropriate to maximize independence and safety with all ADLs.    If plan is discharge home, recommend the following: A lot of help with walking and/or transfers;A lot of help with bathing/dressing/bathroom;Assistance with cooking/housework;Assistance with feeding;Direct supervision/assist for medications management;Direct supervision/assist for financial management;Assist for transportation;Help with stairs or ramp for entrance;Supervision due to cognitive status     Equipment Recommendations  Other (comment) (Defer to next level of  care)       Precautions / Restrictions Precautions Precautions: Fall Recall of Precautions/Restrictions: Impaired Restrictions Weight Bearing Restrictions Per Provider Order: Yes LLE Weight Bearing Per Provider Order: Weight bearing as tolerated Other Position/Activity Restrictions: WBAT L LE with walker     Mobility  Bed Mobility Overal bed mobility: Needs Assistance Bed Mobility: Supine to Sit, Rolling, Sidelying to Sit Rolling: Max assist, Used rails Sidelying to sit: Max assist, Used rails Supine to sit: Max assist, Used rails  General bed mobility comments: pt was able to roll L and R for bed linene change prior to performing rolling R to short sit. increased time + max assist    Transfers Overall transfer level: Needs assistance Equipment used: Rolling walker (2 wheels) Transfers: Sit to/from Stand Sit to Stand: Max assist, From elevated surface Step pivot transfers: Max assist, From elevated surface  General transfer comment: Pt was able to stand form elevated bed height to RW. mod assist to actually stand however max assist to progress to taking 2 very antalgic steps to recliner.    Ambulation/Gait  General Gait Details: was able to clear floor with L then RLE however LLE slightly buckling with RLE progression    Balance Overall balance assessment: Needs assistance Sitting-balance support: Bilateral upper extremity supported, Feet supported Sitting balance-Leahy Scale: Fair     Standing balance support: Bilateral upper extremity supported, Reliant on assistive device for balance Standing balance-Leahy Scale: Poor       Communication Communication Communication: Impaired Factors Affecting Communication: Hearing impaired  Cognition Arousal: Alert Behavior During Therapy: WFL for tasks assessed/performed   PT - Cognitive impairments: History of cognitive impairments  PT - Cognition Comments: Pt was A and extremely pleasant. pleasantly confused but only  truely oriented x2. Pt is able to consistently follow simple commands Following commands: Impaired Following commands impaired: Follows one step commands with increased time    Cueing Cueing Techniques: Verbal cues, Tactile cues, Gestural cues, Visual cues     General Comments General comments (skin integrity, edema, etc.): Chartered loss adjuster issued HEP handout with instructions to pt's son. Pt and family performing HEP at conclusion of session      Pertinent Vitals/Pain Pain Assessment Pain Assessment: PAINAD Breathing: normal Negative Vocalization: occasional moan/groan, low speech, negative/disapproving quality Facial Expression: smiling or inexpressive Body Language: relaxed Consolability: no need to console PAINAD Score: 1 Pain Location: L hip/thigh and headache Pain Descriptors / Indicators: Aching, Discomfort, Guarding, Headache Pain Intervention(s): Limited activity within patient's tolerance, Monitored during session, Premedicated before session, Repositioned     PT Goals (current goals can now be found in the care plan section) Acute Rehab PT Goals Patient Stated Goal: move better Progress towards PT goals: Progressing toward goals    Frequency    7X/week       AM-PAC PT 6 Clicks Mobility   Outcome Measure  Help needed turning from your back to your side while in a flat bed without using bedrails?: A Lot Help needed moving from lying on your back to sitting on the side of a flat bed without using bedrails?: A Lot Help needed moving to and from a bed to a chair (including a wheelchair)?: A Lot Help needed standing up from a chair using your arms (e.g., wheelchair or bedside chair)?: A Lot Help needed to walk in hospital room?: A Lot Help needed climbing 3-5 steps with a railing? : Total 6 Click Score: 11    End of Session Equipment Utilized During Treatment: Oxygen (2L) Activity Tolerance: Patient tolerated treatment well Patient left: in chair;with call bell/phone  within reach;with chair alarm set Nurse Communication: Mobility status PT Visit Diagnosis: Other abnormalities of gait and mobility (R26.89);Muscle weakness (generalized) (M62.81);History of falling (Z91.81);Pain Pain - Right/Left: Left Pain - part of body: Hip     Time: 0733-0806 PT Time Calculation (min) (ACUTE ONLY): 33 min  Charges:    $Therapeutic Exercise: 8-22 mins $Therapeutic Activity: 8-22 mins PT General Charges $$ ACUTE PT VISIT: 1 Visit          Rankin Essex PTA 07/27/24, 8:24 AM

## 2024-07-27 NOTE — Progress Notes (Signed)
   Subjective: 3 Days Post-Op Procedure(s) (LRB): OPEN REDUCTION INTERNAL FIXATION HIP (Left) Patient reports left hip pain as mild. Difficult historian. Hx of dementia. Slow progress of physical therapy, improving. Patient had corticosteroid injections performed yesterday by [facility] Amber, unable to state any improvement No CP, SOB, fevers or chills Continue to work with PT today Plan to DC to SNF  Objective: Vital signs in last 24 hours: Temp:  [97.7 F (36.5 C)-98.6 F (37 C)] 97.7 F (36.5 C) (08/14 0458) Pulse Rate:  [68-75] 74 (08/14 0458) Resp:  [16-20] 16 (08/14 0458) BP: (92-117)/(38-62) 117/62 (08/14 0458) SpO2:  [96 %-100 %] 100 % (08/14 0458)  Intake/Output from previous day: 08/13 0701 - 08/14 0700 In: 802 [P.O.:480; Blood:322] Out: -  Intake/Output this shift: No intake/output data recorded.  Recent Labs    07/25/24 0554 07/26/24 0249 07/27/24 0622  HGB 8.1* 7.2* 9.9*   Recent Labs    07/26/24 0249 07/27/24 0622  WBC 11.2* 10.7*  RBC 2.49* 3.28*  HCT 23.5* 29.7*  PLT 155 175   Recent Labs    07/26/24 0249 07/27/24 0622  NA 136 136  K 4.6 5.1  CL 101 101  CO2 27 28  BUN 55* 50*  CREATININE 1.51* 1.18*  GLUCOSE 109* 164*  CALCIUM 8.5* 8.8*   No results for input(s): LABPT, INR in the last 72 hours. X-rays of the left and right knee reviewed by me today from 07/25/2024 showed complete loss of joint space in the medial compartment of the left and right knee.  No evidence of acute bony abnormality.  EXAM General - Patient is Alert, Appropriate, and Oriented Left hip/lower extremity - Sensation intact distally Intact pulses distally Dorsiflexion/Plantar flexion intact Dressing - dressing wet and lost adhesive. Area cleaned with alcohol prep and redressed with 2 new honeycomb bandages Motor Function - intact, moving foot and toes well on exam.   Past Medical History:  Diagnosis Date   CKD (chronic kidney disease)    Diabetes mellitus  without complication (HCC)    Essential hypertension, benign    Mixed hyperlipidemia     Assessment/Plan:   3 Days Post-Op Procedure(s) (LRB): OPEN REDUCTION INTERNAL FIXATION HIP (Left) Principal Problem:   Closed left hip fracture (HCC) Active Problems:   Idiopathic gout   Essential hypertension, benign   Memory impairment   Fall at home, initial encounter   Type II diabetes mellitus with renal manifestations (HCC)   Chronic kidney disease, stage 3a (HCC)   Oxygen desaturation   Obesity (BMI 30-39.9)   Elevated CK   Syncope   Acute blood loss anemia  Estimated body mass index is 30.54 kg/m as calculated from the following:   Height as of this encounter: 5' 3 (1.6 m).   Weight as of this encounter: 78.2 kg. Advance diet Up with therapy - WBAT Pain well controlled VSS Hgb at 9.9 after transfusion of 1 unit pRBC on 07/26/2024.  WBC trending down at 10.7 Recheck hemoglobin this afternoon and tomorrow morning.  New honeycomb dressing applied  Pain well-controlled with Tylenol .  Follow up with [facility] in 2 weeks  DVT Prophylaxis - Aspirin , TED hose, and SCDs Weight-Bearing as tolerated to left leg   [name] CHARLENA Koyanagi, PA-C [facility] 07/27/2024, 7:48 AM

## 2024-07-27 NOTE — Progress Notes (Signed)
 Occupational Therapy Treatment Patient Details Name: [name] Griffin MRN: [id number] DOB: 15-Dec-1927 Today's Date: 07/27/2024   History of present illness Pt is a 89 y.o. female presenting to hospital 07/23/24 s/p unwitnessed fall; c/o L hip pain; per son pt with multiple brief episodes of possible syncope (described as suddenly stopped talking, with confusion and eye staring lasting for a few seconds).  Imaging showing acute comminuted L femoral intra trochanteric fx and healed/healing R superior and inferior pubic rami fx's; possible healing R sacral ala fx's.  Pt admitted with closed L hip fx, pelvic fx, s/p fall, oxygen desaturation, elevated CK, and syncope.  S/p ORIF L hip 07/24/24.  PMH includes DM, htn, CKD, gout, HOH, memory loss (h/o mild dementia).   OT comments  Pt pleasant, agreeable, found in recliner, hearing aides in. Pt denies pain/complaints, VC required to redirect as she moves from one topic to another, most tangential to guided conversation attempts by therapist. Pt set up with visual cues to wash her face. Attempted to have pt clean her dentures but cognition versus HOH making it difficult to follow cues for initiation and sequencing. Pt appears confused, oriented to self and reports barely eating. Spoke with RN afterwards who reported she ate most of her breakfast. Pt continues to benefit from skilled OT services to maximize return to PLOF.       If plan is discharge home, recommend the following:  Two people to help with walking and/or transfers;A lot of help with bathing/dressing/bathroom;Direct supervision/assist for medications management;Supervision due to cognitive status;Direct supervision/assist for financial management;Assist for transportation;Assistance with cooking/housework;Help with stairs or ramp for entrance   Equipment Recommendations  Other (comment) (defer)       Precautions / Restrictions Precautions Precautions: Fall Recall of Precautions/Restrictions:  Impaired Precaution/Restrictions Comments: Seizure precautions Restrictions Weight Bearing Restrictions Per Provider Order: Yes LLE Weight Bearing Per Provider Order: Weight bearing as tolerated Other Position/Activity Restrictions: WBAT L LE with walker      ADL either performed or assessed with clinical judgement   ADL Overall ADL's : Needs assistance/impaired     Grooming: Sitting;Oral care;Wash/dry face;Supervision/safety;Set up;Maximal assistance Grooming Details (indicate cue type and reason): set up and supv for washing her face with visual cue to initiate 2/2 HOH. Pt required MAX A for denture care. Dentures noted to appear poorly maintained and would benefit from professional cleaning.           Communication Communication Communication: Impaired Factors Affecting Communication: Hearing impaired   Cognition Arousal: Alert Behavior During Therapy: WFL for tasks assessed/performed Cognition: Difficult to assess Difficult to assess due to: Hard of hearing/deaf       OT - Cognition Comments: Very pleasant, difficulty following commands and engaging in conversation/education 2/2 HOH       Following commands: Impaired Following commands impaired: Follows one step commands inconsistently      Cueing   Cueing Techniques: Verbal cues, Tactile cues, Gestural cues, Visual cues             Pertinent Vitals/ Pain       Pain Assessment Pain Assessment: PAINAD Breathing: normal Negative Vocalization: none Facial Expression: smiling or inexpressive Body Language: relaxed Consolability: no need to console PAINAD Score: 0 Pain Intervention(s): Monitored during session, Premedicated before session   Frequency  Min 2X/week        Progress Toward Goals  OT Goals(current goals can now be found in the care plan section)  Progress towards OT goals: Progressing toward goals  Acute  Rehab OT Goals Patient Stated Goal: get better and stay independent OT Goal  Formulation: With patient/family Time For Goal Achievement: 08/08/24 Potential to Achieve Goals: Good   AM-PAC OT 6 Clicks Daily Activity     Outcome Measure   Help from another person eating meals?: None Help from another person taking care of personal grooming?: A Little Help from another person toileting, which includes using toliet, bedpan, or urinal?: Total Help from another person bathing (including washing, rinsing, drying)?: A Lot Help from another person to put on and taking off regular upper body clothing?: A Lot Help from another person to put on and taking off regular lower body clothing?: A Lot 6 Click Score: 14    End of Session Equipment Utilized During Treatment: Oxygen  OT Visit Diagnosis: Other abnormalities of gait and mobility (R26.89);Muscle weakness (generalized) (M62.81)   Activity Tolerance Patient tolerated treatment well   Patient Left in chair;with call bell/phone within reach;with chair alarm set           Time: 8883-8865 OT Time Calculation (min): 18 min  Charges: OT General Charges $OT Visit: 1 Visit OT Treatments $Self Care/Home Management : 8-22 mins  Warren SAUNDERS., MPH, MS, OTR/L ascom [phone] 07/27/24, 1:23 PM

## 2024-07-27 NOTE — Plan of Care (Signed)
   Problem: Health Behavior/Discharge Planning: Goal: Ability to manage health-related needs will improve Outcome: Progressing

## 2024-07-28 DIAGNOSIS — S72002A Fracture of unspecified part of neck of left femur, initial encounter for closed fracture: Secondary | ICD-10-CM | POA: Diagnosis not present

## 2024-07-28 MED ORDER — CYANOCOBALAMIN 1000 MCG PO TABS: 1000.0000 ug | ORAL_TABLET | Freq: Every day | ORAL | Status: DC

## 2024-07-28 MED ORDER — SENNOSIDES-DOCUSATE SODIUM 8.6-50 MG PO TABS: 1.0000 | ORAL_TABLET | Freq: Every evening | ORAL | Status: DC | PRN

## 2024-07-28 NOTE — TOC Initial Note (Signed)
 Transition of Care [facility]) - Initial/Assessment Note    Patient Details  Name: [name] Griffin MRN: [id number] Date of Birth: 08/30/1928  Transition of Care [facility] CM/SW Contact:    Alvaro Louder, LCSW Phone Number: 07/28/2024, 9:34 AM  Clinical Narrative:    Chart reviewed. [facility] Faxed out information to SNF's in [place]. L[facility]WA will present Facilities to patient at the bedside.            TOC to follow for discharge.       Patient Goals and CMS Choice            Expected Discharge Plan and Services                                              Prior Living Arrangements/Services                       Activities of Daily Living   ADL Screening (condition at time of admission) Independently performs ADLs?: No Does the patient have a NEW difficulty with bathing/dressing/toileting/self-feeding that is expected to last >3 days?: Yes (Initiates electronic notice to provider for possible OT consult) Does the patient have a NEW difficulty with getting in/out of bed, walking, or climbing stairs that is expected to last >3 days?: Yes (Initiates electronic notice to provider for possible PT consult) Does the patient have a NEW difficulty with communication that is expected to last >3 days?: No Is the patient deaf or have difficulty hearing?: Yes Does the patient have difficulty seeing, even when wearing glasses/contacts?: No Does the patient have difficulty concentrating, remembering, or making decisions?: Yes  Permission Sought/Granted                  Emotional Assessment              Admission diagnosis:  Hypoxia [R09.02] Elevated CK [R74.8] Closed left hip fracture (HCC) [S72.002A] Closed displaced intertrochanteric fracture of left femur, initial encounter (HCC) [S72.142A] Fall in home, initial encounter [W19.CHERENE, Y92.009] Patient Active Problem List   Diagnosis Date Noted   Acute blood loss anemia 07/25/2024   Closed left hip fracture  (HCC) 07/23/2024   Fall at home, initial encounter 07/23/2024   Type II diabetes mellitus with renal manifestations (HCC) 07/23/2024   Chronic kidney disease, stage 3a (HCC) 07/23/2024   Oxygen desaturation 07/23/2024   Obesity (BMI 30-39.9) 07/23/2024   Elevated CK 07/23/2024   Syncope 07/23/2024   Memory impairment 01/27/2024   Type 2 diabetes mellitus without complication, without long-term current use of insulin (HCC) 07/27/2023   Idiopathic gout 07/27/2023   Essential hypertension, benign 07/27/2023   Stage 2 chronic kidney disease 07/27/2023   Presbycusis of both ears 07/27/2023   Mixed hyperlipidemia 07/27/2023   PCP:  Fernand Fredy RAMAN, MD Pharmacy:   [facility] [phone] - [place], [place] - [address] [place] [address] Phone: [phone] Fax: [phone]     Social Drivers of Health (SDOH) Social History: SDOH Screenings   Food Insecurity: No Food Insecurity (07/24/2024)  Housing: Low Risk  (07/24/2024)  Transportation Needs: Patient Unable To Answer (07/24/2024)  Utilities: Patient Unable To Answer (07/24/2024)  Social Connections: Unknown (07/24/2024)  Tobacco Use: Low Risk  (07/25/2024)   SDOH Interventions:     Readmission Risk Interventions     No data  to display

## 2024-07-28 NOTE — Progress Notes (Addendum)
 Physical Therapy Treatment Patient Details Name: [name] Griffin MRN: [id number] DOB: 03/30/1928 Today's Date: 07/28/2024   History of Present Illness Pt is a 89 y.o. female presenting to hospital 07/23/24 s/p unwitnessed fall; c/o L hip pain; per son pt with multiple brief episodes of possible syncope (described as suddenly stopped talking, with confusion and eye staring lasting for a few seconds).  Imaging showing acute comminuted L femoral intra trochanteric fx and healed/healing R superior and inferior pubic rami fx's; possible healing R sacral ala fx's.  Pt admitted with closed L hip fx, pelvic fx, s/p fall, oxygen desaturation, elevated CK, and syncope.  S/p ORIF L hip 07/24/24.  PMH includes DM, htn, CKD, gout, HOH, memory loss (h/o mild dementia).    PT Comments  Pt very pleasant and agreeable to participate in PT. Pt able to initiate bed mobility, required modA to achieve sitting EOB. Initial STS with modA, pt able to shuffle to R toward [facility] with modA to offload R LE. Pt unable to lift R foot off of ground without assistance for transfers/side-stepping d/t poor tolerance to weight bearing on L LE. Stand pivot transfer to recliner with maxA. Pt given max multimodal cues for mobility d/t hearing deficits. The patient would benefit from further skilled PT intervention to continue to progress towards goals.   If plan is discharge home, recommend the following: A lot of help with walking and/or transfers;A lot of help with bathing/dressing/bathroom;Assistance with cooking/housework;Assistance with feeding;Direct supervision/assist for medications management;Direct supervision/assist for financial management;Assist for transportation;Help with stairs or ramp for entrance;Supervision due to cognitive status   Can travel by private vehicle     No  Equipment Recommendations  Other (comment) (TBD)    Recommendations for Other Services       Precautions / Restrictions Precautions Precautions:  Fall Precaution/Restrictions Comments: Seizure precautions Restrictions Weight Bearing Restrictions Per Provider Order: Yes LLE Weight Bearing Per Provider Order: Weight bearing as tolerated     Mobility  Bed Mobility Overal bed mobility: Needs Assistance Bed Mobility: Supine to Sit     Supine to sit: Mod assist, HOB elevated, Used rails     General bed mobility comments: pt able to initiate supine > sit, required modA to achieve sitting EOB, increased time and effort    Transfers Overall transfer level: Needs assistance Equipment used: Rolling walker (2 wheels) Transfers: Sit to/from Stand, Bed to chair/wheelchair/BSC Sit to Stand: Mod assist, Max assist, +2 safety/equipment Stand pivot transfers: Max assist, +2 safety/equipment         General transfer comment: Initial STS from EOB with modA, modA to side-step/shuffle to R toward HOB. Stand pivot transfer to recliner with maxA    Ambulation/Gait                   Stairs             Wheelchair Mobility     Tilt Bed    Modified Rankin (Stroke Patients Only)       Balance Overall balance assessment: Needs assistance Sitting-balance support: Bilateral upper extremity supported, Feet supported Sitting balance-Leahy Scale: Good     Standing balance support: Bilateral upper extremity supported, Reliant on assistive device for balance Standing balance-Leahy Scale: Poor                              Communication Communication Communication: Impaired Factors Affecting Communication: Hearing impaired  Cognition Arousal: Alert Behavior During Therapy:  WFL for tasks assessed/performed   PT - Cognitive impairments: History of cognitive impairments                       PT - Cognition Comments: Alert, pleasant Following commands: Impaired Following commands impaired: Follows one step commands with increased time    Cueing Cueing Techniques: Verbal cues, Gestural cues,  Tactile cues  Exercises Total Joint Exercises Long Arc Quad: AROM, Both, 10 reps, Seated    General Comments        Pertinent Vitals/Pain Pain Assessment Pain Assessment: PAINAD Breathing: normal Negative Vocalization: none Facial Expression: smiling or inexpressive Body Language: relaxed Consolability: no need to console PAINAD Score: 0    Home Living                          Prior Function            PT Goals (current goals can now be found in the care plan section) Progress towards PT goals: Progressing toward goals    Frequency    7X/week      PT Plan      Co-evaluation              AM-PAC PT 6 Clicks Mobility   Outcome Measure  Help needed turning from your back to your side while in a flat bed without using bedrails?: A Lot Help needed moving from lying on your back to sitting on the side of a flat bed without using bedrails?: A Lot Help needed moving to and from a bed to a chair (including a wheelchair)?: A Lot Help needed standing up from a chair using your arms (e.g., wheelchair or bedside chair)?: A Lot Help needed to walk in hospital room?: A Lot Help needed climbing 3-5 steps with a railing? : Total 6 Click Score: 11    End of Session Equipment Utilized During Treatment: Gait belt Activity Tolerance: Patient tolerated treatment well Patient left: in chair;with call bell/phone within reach;with chair alarm set Nurse Communication: Mobility status PT Visit Diagnosis: Other abnormalities of gait and mobility (R26.89);Muscle weakness (generalized) (M62.81);History of falling (Z91.81);Pain Pain - Right/Left: Left Pain - part of body: Hip     Time: 8578-8562 PT Time Calculation (min) (ACUTE ONLY): 16 min  Charges:    $Therapeutic Activity: 8-22 mins PT General Charges $$ ACUTE PT VISIT: 1 Visit                     Janell Axe, SPT

## 2024-07-28 NOTE — Plan of Care (Signed)
   Problem: Health Behavior/Discharge Planning: Goal: Ability to manage health-related needs will improve Outcome: Progressing

## 2024-07-28 NOTE — Progress Notes (Addendum)
 Progress Note    [name] Griffin  FMW:[id number] DOB: 02-08-1928  DOA: 07/23/2024 PCP: Fernand Fredy RAMAN, MD      Brief Narrative:    Medical records reviewed and are as summarized below:  [name] Griffin is a 89 y.o. female with medical history significant of HTN, HLD, diet-controlled DM, gout, CKD-3A, memory loss, hard of hearing, who presented to the emergency department with left hip pain following a fall at home.     Assessment/Plan:   Principal Problem:   Closed left hip fracture (HCC) Active Problems:   Fall at home, initial encounter   Oxygen desaturation   Elevated CK   Essential hypertension, benign   Type II diabetes mellitus with renal manifestations (HCC)   Chronic kidney disease, stage 3a (HCC)   Idiopathic gout   Memory impairment   Syncope   Obesity (BMI 30-39.9)   Acute blood loss anemia   Nutrition Problem: Increased nutrient needs Etiology: post-op healing  Signs/Symptoms: estimated needs   Body mass index is 30.54 kg/m.  (Class I obesity)    Closed left hip fracture and pelvic fracture: Fall at home. Syncope/presyncope. S/p ORIF left hip on 07/24/2024 X-ray showed  comminuted intertrochanteric fracture in the proximal left femur with 8 mm displacement of the lesser trochanter. Patient had episodes of syncope/presyncope at home, etiology is unclear.   Telemetry did not show any arrhythmia 2D echo showed EF estimated at 60 to 65%, mild LVH, grade 1 diastolic dysfunction, moderately elevated pulmonary artery systolic pressure, mild to moderate TR   Acute postoperative blood loss anemia. Hemoglobin up from 7.2-9.9.  S/p transfusion with 1 unit of PRBCs on 07/26/2024. Hemoglobin down from 13-10.2-8.1-7.2.   Oxygen desaturation:  Unknown if this was truly acute hypoxic respiratory failure or oxygen desaturation was due to inaccurate reading from pulse oximetry. She has been weaned off of oxygen and is tolerating room air.   Elevated  CK: CK mildly elevated 432. -pt received 500 cc normal saline This is traumatic from fall.   Probable AKI on CKD stage IIIa: Fluctuating creatinine and difficult to determine exact stage of kidney disease   Low normal vitamin B12: Vitamin B12 level was 219.  Continue vitamin B12 supplement.    Comorbidities include hypertension, diet-controlled type II DM, , gout, cognitive impairment.     Awaiting placement to SNF  Diet Order             Diet regular Fluid consistency: Thin  Diet effective now                                  Consultants: Orthopedic surgeon  Procedures: ORIF left hip on 07/24/2024    Medications:    sodium chloride    Intravenous Once   sodium chloride    Intravenous Once   allopurinol   100 mg Oral Daily   aspirin  EC  325 mg Oral Daily   atenolol   25 mg Oral Daily   vitamin B-12  1,000 mcg Oral Daily   donepezil   10 mg Oral QHS   feeding supplement  237 mL Oral BID BM   furosemide   20 mg Oral Daily   lidocaine  (PF)  10 mL Other Once   lisinopril   5 mg Oral Daily   multivitamin with minerals  1 tablet Oral Daily   triamcinolone  acetonide  80 mg Intra-articular Once   Continuous Infusions:   Anti-infectives (From  admission, onward)    Start     Dose/Rate Route Frequency Ordered Stop   07/24/24 1230  ceFAZolin  (ANCEF ) IVPB 2g/100 mL premix  Status:  Discontinued       Note to Pharmacy: To be administered intravenously immediately before surgery and not to be sent to the floor.   2 g 200 mL/hr over 30 Minutes Intravenous Every 6 hours 07/24/24 1224 07/24/24 1716   07/24/24 1000  azithromycin  (ZITHROMAX ) tablet 250 mg       Placed in Followed by Linked Group   250 mg Oral Daily 07/23/24 2030 07/27/24 0937   07/23/24 2130  azithromycin  (ZITHROMAX ) tablet 500 mg       Placed in Followed by Linked Group   500 mg Oral Daily 07/23/24 2030 07/23/24 2223              Family Communication/Anticipated D/C date and  plan/Code Status   DVT prophylaxis: SCDs Start: 07/24/24 1538 SCDs Start: 07/23/24 1848     Code Status: Full Code  Family Communication: Plan discussed with [name], son, at the bedside Disposition Plan: Plan to discharge to SNF   Status is: Inpatient Remains inpatient appropriate because: Awaiting placement to SNF       Subjective:   Interval events noted.  No complaints.  She feels fine.  Family at bedside.  [name] (son), Mark's partner and patient's brother-in-law.  Objective:    Vitals:   07/27/24 1531 07/27/24 2015 07/28/24 0359 07/28/24 0752  BP: (!) 106/44 (!) 110/49 (!) 149/56 126/79  Pulse: 65 70 70 60  Resp: 17 20 20 18   Temp: 98 F (36.7 C) 98.6 F (37 C) 98.1 F (36.7 C) 98.3 F (36.8 C)  TempSrc: Oral     SpO2: 97% 95% 98% 94%  Weight:      Height:       No data found.   Intake/Output Summary (Last 24 hours) at 07/28/2024 1305 Last data filed at 07/27/2024 1918 Gross per 24 hour  Intake 420 ml  Output 550 ml  Net -130 ml   Filed Weights   07/23/24 1106 07/24/24 1122  Weight: 78.2 kg 78.2 kg    Exam:   GEN: NAD SKIN: Warm and dry EYES: No pallor or icterus ENT: MMM CV: RRR PULM: CTA B ABD: soft, ND, NT, +BS CNS: AAO x person and place and situation, non focal EXT: No edema or tenderness         Data Reviewed:   I have personally reviewed following labs and imaging studies:  Labs: Labs show the following:   Basic Metabolic Panel: Recent Labs  Lab 07/23/24 1221 07/24/24 0506 07/25/24 0554 07/26/24 0249 07/27/24 0622  NA 136 138 138 136 136  K 4.5 4.5 4.5 4.6 5.1  CL 97* 104 101 101 101  CO2 24 27 26 27 28   GLUCOSE 165* 119* 129* 109* 164*  BUN 25* 30* 39* 55* 50*  CREATININE 1.20* 1.24* 1.41* 1.51* 1.18*  CALCIUM 9.5 8.7* 8.5* 8.5* 8.8*  MG  --   --  1.9  --   --    GFR Estimated Creatinine Clearance: 29.5 mL/min (A) (by C-G formula based on SCr of 1.18 mg/dL (H)). Liver Function Tests: Recent Labs  Lab  07/23/24 1221  AST 34  ALT 16  ALKPHOS 79  BILITOT 0.8  PROT 7.2  ALBUMIN 3.3*   No results for input(s): LIPASE, AMYLASE in the last 168 hours. No results for input(s): AMMONIA in the last 168  hours. Coagulation profile Recent Labs  Lab 07/23/24 1221  INR 1.2    CBC: Recent Labs  Lab 07/23/24 1136 07/24/24 0506 07/25/24 0554 07/26/24 0249 07/27/24 0622  WBC 11.9* 8.1 11.3* 11.2* 10.7*  NEUTROABS 10.2*  --   --   --   --   HGB 13.0 10.2* 8.1* 7.2* 9.9*  HCT 41.2 32.0* 25.9* 23.5* 29.7*  MCV 92.0 92.0 93.2 94.4 90.5  PLT 221 169 162 155 175   Cardiac Enzymes: Recent Labs  Lab 07/23/24 1221  CKTOTAL 432*   BNP (last 3 results) No results for input(s): PROBNP in the last 8760 hours. CBG: Recent Labs  Lab 07/23/24 1845 07/24/24 0738 07/24/24 1120 07/24/24 1507 07/25/24 0937  GLUCAP 100* 125* 101* 126* 132*   D-Dimer: No results for input(s): DDIMER in the last 72 hours. Hgb A1c: No results for input(s): HGBA1C in the last 72 hours. Lipid Profile: No results for input(s): CHOL, HDL, LDLCALC, TRIG, CHOLHDL, LDLDIRECT in the last 72 hours. Thyroid function studies: No results for input(s): TSH, T4TOTAL, T3FREE, THYROIDAB in the last 72 hours.  Invalid input(s): FREET3 Anemia work up: No results for input(s): VITAMINB12, FOLATE, FERRITIN, TIBC, IRON, RETICCTPCT in the last 72 hours.  Sepsis Labs: Recent Labs  Lab 07/23/24 2312 07/24/24 0506 07/25/24 0554 07/26/24 0249 07/27/24 0622  PROCALCITON <0.10  --   --   --   --   WBC  --  8.1 11.3* 11.2* 10.7*    Microbiology Recent Results (from the past 240 hours)  Resp panel by RT-PCR (RSV, Flu A&B, Covid) Anterior Nasal Swab     Status: None   Collection Time: 07/23/24  9:53 PM   Specimen: Anterior Nasal Swab  Result Value Ref Range Status   SARS Coronavirus 2 by RT PCR NEGATIVE NEGATIVE Final    Comment: (NOTE) SARS-CoV-2 target nucleic acids are NOT  DETECTED.  The SARS-CoV-2 RNA is generally detectable in upper respiratory specimens during the acute phase of infection. The lowest concentration of SARS-CoV-2 viral copies this assay can detect is 138 copies/mL. A negative result does not preclude SARS-Cov-2 infection and should not be used as the sole basis for treatment or other patient management decisions. A negative result may occur with  improper specimen collection/handling, submission of specimen other than nasopharyngeal swab, presence of viral mutation(s) within the areas targeted by this assay, and inadequate number of viral copies(<138 copies/mL). A negative result must be combined with clinical observations, patient history, and epidemiological information. The expected result is Negative.  Fact Sheet for Patients:  BloggerCourse.com  Fact Sheet for Healthcare Providers:  SeriousBroker.it  This test is no t yet approved or cleared by the [place]  FDA and  has been authorized for detection and/or diagnosis of SARS-CoV-2 by FDA under an Emergency Use Authorization (EUA). This EUA will remain  in effect (meaning this test can be used) for the duration of the COVID-19 declaration under Section 564(b)(1) of the Act, 21 U.S.C.section 360bbb-3(b)(1), unless the authorization is terminated  or revoked sooner.       Influenza A by PCR NEGATIVE NEGATIVE Final   Influenza B by PCR NEGATIVE NEGATIVE Final    Comment: (NOTE) The Xpert Xpress SARS-CoV-2/FLU/RSV plus assay is intended as an aid in the diagnosis of influenza from Nasopharyngeal swab specimens and should not be used as a sole basis for treatment. Nasal washings and aspirates are unacceptable for Xpert Xpress SARS-CoV-2/FLU/RSV testing.  Fact Sheet for Patients: BloggerCourse.com  Fact Sheet for  Healthcare Providers: SeriousBroker.it  This test is not yet  approved or cleared by the [place]  FDA and has been authorized for detection and/or diagnosis of SARS-CoV-2 by FDA under an Emergency Use Authorization (EUA). This EUA will remain in effect (meaning this test can be used) for the duration of the COVID-19 declaration under Section 564(b)(1) of the Act, 21 U.S.C. section 360bbb-3(b)(1), unless the authorization is terminated or revoked.     Resp Syncytial Virus by PCR NEGATIVE NEGATIVE Final    Comment: (NOTE) Fact Sheet for Patients: BloggerCourse.com  Fact Sheet for Healthcare Providers: SeriousBroker.it  This test is not yet approved or cleared by the [place]  FDA and has been authorized for detection and/or diagnosis of SARS-CoV-2 by FDA under an Emergency Use Authorization (EUA). This EUA will remain in effect (meaning this test can be used) for the duration of the COVID-19 declaration under Section 564(b)(1) of the Act, 21 U.S.C. section 360bbb-3(b)(1), unless the authorization is terminated or revoked.  Performed at [facility], [address] [place], [place] [address]     Procedures and diagnostic studies:  No results found.              LOS: 5 days   Adamae Ricklefs  [facility]   Pager on [email or web address]. If 7PM-7AM, please contact night-coverage at [email or web address]     07/28/2024, 1:05 PM

## 2024-07-28 NOTE — Progress Notes (Signed)
 Subjective: 5 Days Post-Op Procedure(s) (LRB): OPEN REDUCTION INTERNAL FIXATION HIP (Left) Patient reports left hip pain as mild. Difficult historian. Hx of dementia. Slow progress of physical therapy, improving. No CP, SOB, fevers or chills Continue to work with PT today Plan to DC to SNF, pending placement per TOC  Objective: Vital signs in last 24 hours: Temp:  [97.5 F (36.4 C)-98.3 F (36.8 C)] 97.6 F (36.4 C) (08/16 0757) Pulse Rate:  [54-65] 65 (08/16 0757) Resp:  [16-19] 17 (08/16 0757) BP: (103-116)/(46-97) 103/50 (08/16 0757) SpO2:  [95 %-100 %] 100 % (08/16 0757)  Intake/Output from previous day: 08/15 0701 - 08/16 0700 In: -  Out: 650 [Urine:650] Intake/Output this shift: No intake/output data recorded.  Recent Labs    07/27/24 0622  HGB 9.9*   Recent Labs    07/27/24 0622  WBC 10.7*  RBC 3.28*  HCT 29.7*  PLT 175   Recent Labs    07/27/24 0622  NA 136  K 5.1  CL 101  CO2 28  BUN 50*  CREATININE 1.18*  GLUCOSE 164*  CALCIUM 8.8*   No results for input(s): LABPT, INR in the last 72 hours.  EXAM General - Patient is Alert, Appropriate, and Oriented Left hip/lower extremity - Sensation intact distally Intact pulses distally Dorsiflexion/Plantar flexion intact Dressing - Clean and intact, very mild serous drainage in proximal bandage Motor Function - intact, moving foot and toes well on exam.   Past Medical History:  Diagnosis Date   CKD (chronic kidney disease)    Diabetes mellitus without complication (HCC)    Essential hypertension, benign    Mixed hyperlipidemia     Assessment/Plan:   5 Days Post-Op Procedure(s) (LRB): OPEN REDUCTION INTERNAL FIXATION HIP (Left) Principal Problem:   Closed left hip fracture (HCC) Active Problems:   Idiopathic gout   Essential hypertension, benign   Memory impairment   Fall at home, initial encounter   Type II diabetes mellitus with renal manifestations (HCC)   Chronic kidney disease,  stage 3a (HCC)   Oxygen desaturation   Obesity (BMI 30-39.9)   Elevated CK   Syncope   Acute blood loss anemia  Estimated body mass index is 30.54 kg/m as calculated from the following:   Height as of this encounter: 5' 3 (1.6 m).   Weight as of this encounter: 78.2 kg. Advance diet Up with therapy - WBAT Pain well controlled VSS Last Hgb at 9.9 after transfusion of 1 unit pRBC on 07/26/2024.  Recheck hemoglobin.  Honeycomb dressing intact  Pain well-controlled with Tylenol .  Follow up with [facility] in 2 weeks  DVT Prophylaxis - Aspirin , TED hose, and SCDs Weight-Bearing as tolerated to left leg   Fonda CHARLENA Koyanagi, PA-C [facility] 07/29/2024, 8:41 AM

## 2024-07-28 NOTE — Progress Notes (Signed)
 Subjective: 4 Days Post-Op Procedure(s) (LRB): OPEN REDUCTION INTERNAL FIXATION HIP (Left) Patient reports left hip pain as mild. Difficult historian. Hx of dementia. Slow progress of physical therapy, improving. No CP, SOB, fevers or chills Continue to work with PT today Plan to DC to SNF  Objective: Vital signs in last 24 hours: Temp:  [97.8 F (36.6 C)-98.6 F (37 C)] 98.1 F (36.7 C) (08/15 0359) Pulse Rate:  [64-70] 70 (08/15 0359) Resp:  [17-20] 20 (08/15 0359) BP: (106-149)/(44-56) 149/56 (08/15 0359) SpO2:  [95 %-99 %] 98 % (08/15 0359)  Intake/Output from previous day: 08/14 0701 - 08/15 0700 In: 660 [P.O.:660] Out: 550 [Urine:550] Intake/Output this shift: No intake/output data recorded.  Recent Labs    07/26/24 0249 07/27/24 0622  HGB 7.2* 9.9*   Recent Labs    07/26/24 0249 07/27/24 0622  WBC 11.2* 10.7*  RBC 2.49* 3.28*  HCT 23.5* 29.7*  PLT 155 175   Recent Labs    07/26/24 0249 07/27/24 0622  NA 136 136  K 4.6 5.1  CL 101 101  CO2 27 28  BUN 55* 50*  CREATININE 1.51* 1.18*  GLUCOSE 109* 164*  CALCIUM 8.5* 8.8*   No results for input(s): LABPT, INR in the last 72 hours.  EXAM General - Patient is Alert, Appropriate, and Oriented Left hip/lower extremity - Sensation intact distally Intact pulses distally Dorsiflexion/Plantar flexion intact Dressing - both proximal and distal dressings wet and lost adhesive. Area cleaned with alcohol prep and redressed with 2 new honeycomb bandages Motor Function - intact, moving foot and toes well on exam.   Past Medical History:  Diagnosis Date   CKD (chronic kidney disease)    Diabetes mellitus without complication (HCC)    Essential hypertension, benign    Mixed hyperlipidemia     Assessment/Plan:   4 Days Post-Op Procedure(s) (LRB): OPEN REDUCTION INTERNAL FIXATION HIP (Left) Principal Problem:   Closed left hip fracture (HCC) Active Problems:   Idiopathic gout   Essential  hypertension, benign   Memory impairment   Fall at home, initial encounter   Type II diabetes mellitus with renal manifestations (HCC)   Chronic kidney disease, stage 3a (HCC)   Oxygen desaturation   Obesity (BMI 30-39.9)   Elevated CK   Syncope   Acute blood loss anemia  Estimated body mass index is 30.54 kg/m as calculated from the following:   Height as of this encounter: 5' 3 (1.6 m).   Weight as of this encounter: 78.2 kg. Advance diet Up with therapy - WBAT Pain well controlled VSS Last Hgb at 9.9 after transfusion of 1 unit pRBC on 07/26/2024.  WBC trending down at 10.7 Recheck hemoglobin.  Pain well-controlled with Tylenol .  Follow up with [facility] in 2 weeks  DVT Prophylaxis - Aspirin , TED hose, and SCDs Weight-Bearing as tolerated to left leg   Fonda CHARLENA Koyanagi, PA-C [facility] 07/28/2024, 7:48 AM

## 2024-07-28 NOTE — TOC Progression Note (Addendum)
 Transition of Care [facility]) - Progression Note    Patient Details  Name: Susie Ehresman MRN: [id number] Date of Birth: 28-May-1928  Transition of [facility] CM/SW Contact  Alvaro Louder, [place] Phone Number: 07/28/2024, 2:13 PM  Clinical Narrative:   07/28/24 3:24 PM: No bed availability on the weekend. Will have be available Monday morning.    LCSWA met with patient and family at the bedside to discuss SNF recommendation. The patient and family selected SNF [facility].   SNF Auth approved AuthID: [id number] Dates: 8/15-8/19/2025 Next Review Date: 08/01/2024    LCSWA reached out to [facility] to confirm bed availability. Awaiting response                     Expected Discharge Plan and Services         Expected Discharge Date: 07/28/24                                     Social Drivers of Health (SDOH) Interventions SDOH Screenings   Food Insecurity: No Food Insecurity (07/24/2024)  Housing: Low Risk  (07/24/2024)  Transportation Needs: Patient Unable To Answer (07/24/2024)  Utilities: Patient Unable To Answer (07/24/2024)  Social Connections: Unknown (07/24/2024)  Tobacco Use: Low Risk  (07/25/2024)    Readmission Risk Interventions     No data to display

## 2024-07-28 NOTE — Progress Notes (Signed)
 OT Cancellation Note  Patient Details Name: [name] Griffin MRN: [id number] DOB: 24-Sep-1928   Cancelled Treatment:    Reason Eval/Treat Not Completed: Fatigue/lethargy limiting ability to participate. Pt sleeping soundly. Will re-attempt at later date/time as able.   Ilka Lovick R., MPH, MS, OTR/L ascom [phone] 07/28/24, 11:32 AM

## 2024-07-28 NOTE — [place] FL2 (Signed)
 [place]  MEDICAID FL2 LEVEL OF CARE FORM     IDENTIFICATION  Patient Name: [name] Griffin Birthdate: 12-Apr-1928 Sex: female Admission Date (Current Location): 07/23/2024  [facility] and [place] Number:  Chiropodist and Address:         Provider Number: [id number]  Attending Physician Name and Address:  Jens Durand, MD  Relative Name and Phone Number:       Current Level of Care: Hospital Recommended Level of Care: Skilled Nursing Facility Prior Approval Number:    Date Approved/Denied:   PASRR Number: [id number]  Discharge Plan: SNF    Current Diagnoses: Patient Active Problem List   Diagnosis Date Noted   Acute blood loss anemia 07/25/2024   Closed left hip fracture (HCC) 07/23/2024   Fall at home, initial encounter 07/23/2024   Type II diabetes mellitus with renal manifestations (HCC) 07/23/2024   Chronic kidney disease, stage 3a (HCC) 07/23/2024   Oxygen desaturation 07/23/2024   Obesity (BMI 30-39.9) 07/23/2024   Elevated CK 07/23/2024   Syncope 07/23/2024   Memory impairment 01/27/2024   Type 2 diabetes mellitus without complication, without long-term current use of insulin (HCC) 07/27/2023   Idiopathic gout 07/27/2023   Essential hypertension, benign 07/27/2023   Stage 2 chronic kidney disease 07/27/2023   Presbycusis of both ears 07/27/2023   Mixed hyperlipidemia 07/27/2023    Orientation RESPIRATION BLADDER Height & Weight     Self, Situation, Place  Normal Incontinent Weight: 172 lb 6.4 oz (78.2 kg) Height:  5' 3 (160 cm)  BEHAVIORAL SYMPTOMS/MOOD NEUROLOGICAL BOWEL NUTRITION STATUS      Incontinent Diet (Regular Fluid)  AMBULATORY STATUS COMMUNICATION OF NEEDS Skin   Extensive Assist Verbally Surgical wounds (Surgical closed incision Left thigh)                       Personal Care Assistance Level of Assistance  Feeding, Bathing, Dressing Bathing Assistance: Maximum assistance Feeding assistance: Independent Dressing  Assistance: Maximum assistance     Functional Limitations Info  Sight, Hearing Sight Info: Impaired Hearing Info: Impaired      SPECIAL CARE FACTORS FREQUENCY  PT (By licensed PT), OT (By licensed OT)     PT Frequency: 5x/week OT Frequency: 5x/week            Contractures      Additional Factors Info  Code Status, Allergies Code Status Info: Full Allergies Info: NKA           Current Medications (07/28/2024):  This is the current hospital active medication list Current Facility-Administered Medications  Medication Dose Route Frequency Provider Last Rate Last Admin   0.9 %  sodium chloride  infusion (Manually program via Guardrails IV Fluids)   Intravenous Once Gaines, Thomas C, PA-C       0.9 %  sodium chloride  infusion (Manually program via Guardrails IV Fluids)   Intravenous Once Ayiku, Bernard, MD       acetaminophen  (TYLENOL ) tablet 650 mg  650 mg Oral Q6H PRN Maryrose Cordella Charleston, MD   650 mg at 07/28/24 0913   albuterol  (PROVENTIL ) (2.5 MG/3ML) 0.083% nebulizer solution 2.5 mg  2.5 mg Inhalation Q4H PRN Maryrose Cordella Charleston, MD       allopurinol  (ZYLOPRIM ) tablet 100 mg  100 mg Oral Daily Crawley, Gregory Robert, MD   100 mg at 07/28/24 0913   aspirin  EC tablet 325 mg  325 mg Oral Daily Crawley, Gregory Robert, MD   325 mg at 07/28/24 [phone]  atenolol  (TENORMIN ) tablet 25 mg  25 mg Oral Daily Crawley, Gregory Robert, MD   25 mg at 07/28/24 0913   cyanocobalamin  (VITAMIN B12) tablet 1,000 mcg  1,000 mcg Oral Daily Zhang, Dekui, MD   1,000 mcg at 07/28/24 0913   dextromethorphan-guaiFENesin  (MUCINEX  DM) 30-600 MG per 12 hr tablet 1 tablet  1 tablet Oral BID PRN Maryrose Cordella Charleston, MD       donepezil  (ARICEPT ) tablet 10 mg  10 mg Oral QHS Crawley, Gregory Robert, MD   10 mg at 07/27/24 2105   feeding supplement (ENSURE PLUS HIGH PROTEIN) liquid 237 mL  237 mL Oral BID BM Crawley, Gregory Robert, MD   237 mL at 07/28/24 0914   furosemide  (LASIX ) tablet 20 mg  20  mg Oral Daily Crawley, Gregory Robert, MD   20 mg at 07/28/24 0913   hydrALAZINE  (APRESOLINE ) injection 5 mg  5 mg Intravenous Q2H PRN Maryrose Cordella Charleston, MD       lidocaine  (PF) (XYLOCAINE ) 1 % injection 10 mL  10 mL Other Once Tobie Priest, MD       lisinopril  (ZESTRIL ) tablet 5 mg  5 mg Oral Daily Crawley, Gregory Robert, MD   5 mg at 07/28/24 9085   LORazepam  (ATIVAN ) injection 2 mg  2 mg Intravenous Q2H PRN Maryrose Cordella Charleston, MD       methocarbamol  (ROBAXIN ) tablet 500 mg  500 mg Oral Q8H PRN Maryrose Cordella Charleston, MD   500 mg at 07/25/24 2131   morphine  (PF) 2 MG/ML injection 0.5 mg  0.5 mg Intravenous Q3H PRN Crawley, Gregory Robert, MD       multivitamin with minerals tablet 1 tablet  1 tablet Oral Daily Crawley, Gregory Robert, MD   1 tablet at 07/28/24 0914   ondansetron  (ZOFRAN ) injection 4 mg  4 mg Intravenous Q8H PRN Maryrose Cordella Charleston, MD       Oral care mouth rinse  15 mL Mouth Rinse PRN Maryrose Cordella Charleston, MD       oxyCODONE -acetaminophen  (PERCOCET/ROXICET) 5-325 MG per tablet 1 tablet  1 tablet Oral Q4H PRN Maryrose Cordella Charleston, MD       senna-docusate (Senokot-S) tablet 1 tablet  1 tablet Oral QHS PRN Maryrose Cordella Charleston, MD   1 tablet at 07/27/24 2105   triamcinolone  acetonide (KENALOG -40) injection 80 mg  80 mg Intra-articular Once Patel, Sunny, MD         Discharge Medications: Please see discharge summary for a list of discharge medications.  Relevant Imaging Results:  Relevant Lab Results:   Additional Information SSN: [id number]  Alvaro Louder, LCSW

## 2024-07-28 NOTE — Plan of Care (Signed)

## 2024-07-29 DIAGNOSIS — S72002A Fracture of unspecified part of neck of left femur, initial encounter for closed fracture: Secondary | ICD-10-CM | POA: Diagnosis not present

## 2024-07-29 NOTE — Progress Notes (Signed)
 Physical Therapy Treatment Patient Details Name: [name] Griffin MRN: [id number] DOB: 08-17-1928 Today's Date: 07/29/2024   History of Present Illness Pt is a 89 y.o. female presenting to hospital 07/23/24 s/p unwitnessed fall; c/o L hip pain; per son pt with multiple brief episodes of possible syncope (described as suddenly stopped talking, with confusion and eye staring lasting for a few seconds).  Imaging showing acute comminuted L femoral intra trochanteric fx and healed/healing R superior and inferior pubic rami fx's; possible healing R sacral ala fx's.  Pt admitted with closed L hip fx, pelvic fx, s/p fall, oxygen desaturation, elevated CK, and syncope.  S/p ORIF L hip 07/24/24.  PMH includes DM, htn, CKD, gout, HOH, memory loss (h/o mild dementia).    PT Comments  Patient received in recliner. Family at bedside, playing dominoes. She reports she needs to go to the bathroom. Patient requires mod +2 assist for sit to stand transfer and steps pivot transfer. Patient required max assist for peri care. She transferred back to recliner with +2 mod/max assist and sits prematurely requiring assistance to safely get back to chair. Patient is limited in direction following for safety and mobility due to being severely HOH. She will continue to benefit from skilled PT to improve independence and safety with mobility.       If plan is discharge home, recommend the following: A lot of help with walking and/or transfers;A lot of help with bathing/dressing/bathroom;Assistance with cooking/housework;Assistance with feeding;Direct supervision/assist for medications management;Direct supervision/assist for financial management;Assist for transportation;Help with stairs or ramp for entrance;Supervision due to cognitive status   Can travel by private vehicle     No  Equipment Recommendations  Other (comment) (TBD next venue)    Recommendations for Other Services       Precautions / Restrictions  Precautions Precautions: Fall Recall of Precautions/Restrictions: Impaired Precaution/Restrictions Comments: Seizure precautions Restrictions Weight Bearing Restrictions Per Provider Order: Yes LLE Weight Bearing Per Provider Order: Weight bearing as tolerated     Mobility  Bed Mobility               General bed mobility comments: patient up in recliner upon arrival. family in room.    Transfers Overall transfer level: Needs assistance Equipment used: Rolling walker (2 wheels) Transfers: Sit to/from Stand Sit to Stand: Mod assist, +2 physical assistance, +2 safety/equipment Stand pivot transfers: Mod assist, +2 physical assistance, Max assist Step pivot transfers: Mod assist, +2 physical assistance       General transfer comment: patient is able to stand with mod +2 assist she was able to take a few steps from recliner to [facility], she sits prematurely and requires assistance to get onto seated surface.    Ambulation/Gait                   Stairs             Wheelchair Mobility     Tilt Bed    Modified Rankin (Stroke Patients Only)       Balance Overall balance assessment: Needs assistance Sitting-balance support: Feet supported Sitting balance-Leahy Scale: Good     Standing balance support: Bilateral upper extremity supported, During functional activity, Reliant on assistive device for balance Standing balance-Leahy Scale: Poor Standing balance comment: patient with flexed posture leaning forearms on walker for support.                            Communication Communication  Communication: Impaired Factors Affecting Communication: Hearing impaired  Cognition Arousal: Alert Behavior During Therapy: WFL for tasks assessed/performed   PT - Cognitive impairments: History of cognitive impairments                       PT - Cognition Comments: Alert, pleasant Following commands: Impaired Following commands impaired:  Follows one step commands inconsistently    Cueing    Exercises Other Exercises Other Exercises: seated AP, heel slides, SLR, hip abd/add x 10 with assistance    General Comments        Pertinent Vitals/Pain Pain Assessment Pain Assessment: PAINAD Breathing: normal Negative Vocalization: none Facial Expression: smiling or inexpressive Body Language: relaxed Consolability: no need to console PAINAD Score: 0 Pain Descriptors / Indicators: Discomfort, Sore Pain Intervention(s): Monitored during session, Repositioned    Home Living                          Prior Function            PT Goals (current goals can now be found in the care plan section) Acute Rehab PT Goals Patient Stated Goal: move better PT Goal Formulation: With patient/family Time For Goal Achievement: 08/08/24 Potential to Achieve Goals: Fair Progress towards PT goals: Progressing toward goals    Frequency    Min 4X/week      PT Plan      Co-evaluation PT/OT/SLP Co-Evaluation/Treatment: Yes            AM-PAC PT 6 Clicks Mobility   Outcome Measure  Help needed turning from your back to your side while in a flat bed without using bedrails?: A Lot Help needed moving from lying on your back to sitting on the side of a flat bed without using bedrails?: A Lot Help needed moving to and from a bed to a chair (including a wheelchair)?: A Lot Help needed standing up from a chair using your arms (e.g., wheelchair or bedside chair)?: A Lot Help needed to walk in hospital room?: Total Help needed climbing 3-5 steps with a railing? : Total 6 Click Score: 10    End of Session Equipment Utilized During Treatment: Gait belt Activity Tolerance: Patient limited by pain;Patient limited by fatigue Patient left: in chair;with call bell/phone within reach;with family/visitor present Nurse Communication: Mobility status PT Visit Diagnosis: Other abnormalities of gait and mobility (R26.89);Muscle  weakness (generalized) (M62.81);History of falling (Z91.81);Pain;Unsteadiness on feet (R26.81);Difficulty in walking, not elsewhere classified (R26.2) Pain - Right/Left: Left Pain - part of body: Hip     Time: 8958-8947 PT Time Calculation (min) (ACUTE ONLY): 11 min  Charges:    $Therapeutic Activity: 8-22 mins PT General Charges $$ ACUTE PT VISIT: 1 Visit                     Hubert Raatz, PT, GCS 07/29/24,11:08 AM

## 2024-07-29 NOTE — Progress Notes (Signed)
 Progress Note    [name] Griffin  FMW:[id number] DOB: 06-17-1928  DOA: 07/23/2024 PCP: Fernand Fredy RAMAN, MD      Brief Narrative:    Medical records reviewed and are as summarized below:  [name] Griffin is a 89 y.o. female with medical history significant of HTN, HLD, diet-controlled DM, gout, CKD-3A, memory loss, hard of hearing, who presented to the emergency department with left hip pain following a fall at home.     Assessment/Plan:   Principal Problem:   Closed left hip fracture (HCC) Active Problems:   Fall at home, initial encounter   Oxygen desaturation   Elevated CK   Essential hypertension, benign   Type II diabetes mellitus with renal manifestations (HCC)   Chronic kidney disease, stage 3a (HCC)   Idiopathic gout   Memory impairment   Syncope   Obesity (BMI 30-39.9)   Acute blood loss anemia   Nutrition Problem: Increased nutrient needs Etiology: post-op healing  Signs/Symptoms: estimated needs   Body mass index is 30.54 kg/m.  (Class I obesity)    Closed left hip fracture and pelvic fracture: Fall at home. Syncope/presyncope. S/p ORIF left hip on 07/24/2024 X-ray showed  comminuted intertrochanteric fracture in the proximal left femur with 8 mm displacement of the lesser trochanter. Patient had episodes of syncope/presyncope at home, etiology is unclear.   Telemetry did not show any arrhythmia 2D echo showed EF estimated at 60 to 65%, mild LVH, grade 1 diastolic dysfunction, moderately elevated pulmonary artery systolic pressure, mild to moderate TR   Acute postoperative blood loss anemia. Hemoglobin up from 7.2-9.9.  S/p transfusion with 1 unit of PRBCs on 07/26/2024. Hemoglobin went down from 13-10.2-8.1-7.2.   Oxygen desaturation:  Resolved   Elevated CK: CK mildly elevated 432. -pt received 500 cc normal saline This is traumatic from fall.   Probable AKI on CKD stage IIIa: Fluctuating creatinine and difficult to determine exact  stage of kidney disease   Low normal vitamin B12: Vitamin B12 level was 219.  Continue vitamin B12 supplement.    Comorbidities include hypertension, diet-controlled type II DM, , gout, cognitive impairment.     Awaiting placement to SNF  Diet Order             Diet - low sodium heart healthy           Diet regular Fluid consistency: Thin  Diet effective now                                  Consultants: Orthopedic surgeon  Procedures: ORIF left hip on 07/24/2024    Medications:    sodium chloride    Intravenous Once   sodium chloride    Intravenous Once   allopurinol   100 mg Oral Daily   aspirin  EC  325 mg Oral Daily   atenolol   25 mg Oral Daily   vitamin B-12  1,000 mcg Oral Daily   donepezil   10 mg Oral QHS   feeding supplement  237 mL Oral BID BM   furosemide   20 mg Oral Daily   lidocaine  (PF)  10 mL Other Once   lisinopril   5 mg Oral Daily   multivitamin with minerals  1 tablet Oral Daily   triamcinolone  acetonide  80 mg Intra-articular Once   Continuous Infusions:   Anti-infectives (From admission, onward)    Start     Dose/Rate Route Frequency Ordered  Stop   07/24/24 1230  ceFAZolin  (ANCEF ) IVPB 2g/100 mL premix  Status:  Discontinued       Note to Pharmacy: To be administered intravenously immediately before surgery and not to be sent to the floor.   2 g 200 mL/hr over 30 Minutes Intravenous Every 6 hours 07/24/24 1224 07/24/24 1716   07/24/24 1000  azithromycin  (ZITHROMAX ) tablet 250 mg       Placed in Followed by Linked Group   250 mg Oral Daily 07/23/24 2030 07/27/24 0937   07/23/24 2130  azithromycin  (ZITHROMAX ) tablet 500 mg       Placed in Followed by Linked Group   500 mg Oral Daily 07/23/24 2030 07/23/24 2223              Family Communication/Anticipated D/C date and plan/Code Status   DVT prophylaxis: SCDs Start: 07/24/24 1538 SCDs Start: 07/23/24 1848     Code Status: Full Code  Family Communication:  Plan discussed with [name], son, at the bedside Disposition Plan: Plan to discharge to SNF   Status is: Inpatient Remains inpatient appropriate because: Awaiting placement to SNF       Subjective:   Interval events noted.  No complaints.  [name] (son) and his partner were at the bedside.  They were playing a board game at the bedside  Objective:    Vitals:   07/28/24 1517 07/28/24 1950 07/29/24 0354 07/29/24 0757  BP: (!) 109/46 (!) 116/97 (!) 115/51 (!) 103/50  Pulse: 61 64 (!) 54 65  Resp: 19 17 16 17   Temp: (!) 97.5 F (36.4 C) 98.3 F (36.8 C) 98 F (36.7 C) 97.6 F (36.4 C)  TempSrc:  Oral Oral   SpO2:  98% 95% 100%  Weight:      Height:       No data found.   Intake/Output Summary (Last 24 hours) at 07/29/2024 1249 Last data filed at 07/28/2024 2240 Gross per 24 hour  Intake --  Output 650 ml  Net -650 ml   Filed Weights   07/23/24 1106 07/24/24 1122  Weight: 78.2 kg 78.2 kg    Exam:  GEN: NAD SKIN: Warm and dry EYES: No pallor or icterus ENT: MMM, hard of hearing CV: RRR PULM: CTA B ABD: soft, ND, NT, +BS CNS: AAO x person, place and situation, non focal EXT: No edema or tenderness       Data Reviewed:   I have personally reviewed following labs and imaging studies:  Labs: Labs show the following:   Basic Metabolic Panel: Recent Labs  Lab 07/23/24 1221 07/24/24 0506 07/25/24 0554 07/26/24 0249 07/27/24 0622  NA 136 138 138 136 136  K 4.5 4.5 4.5 4.6 5.1  CL 97* 104 101 101 101  CO2 24 27 26 27 28   GLUCOSE 165* 119* 129* 109* 164*  BUN 25* 30* 39* 55* 50*  CREATININE 1.20* 1.24* 1.41* 1.51* 1.18*  CALCIUM 9.5 8.7* 8.5* 8.5* 8.8*  MG  --   --  1.9  --   --    GFR Estimated Creatinine Clearance: 29.5 mL/min (A) (by C-G formula based on SCr of 1.18 mg/dL (H)). Liver Function Tests: Recent Labs  Lab 07/23/24 1221  AST 34  ALT 16  ALKPHOS 79  BILITOT 0.8  PROT 7.2  ALBUMIN 3.3*   No results for input(s): LIPASE,  AMYLASE in the last 168 hours. No results for input(s): AMMONIA in the last 168 hours. Coagulation profile Recent Labs  Lab 07/23/24 1221  INR 1.2    CBC: Recent Labs  Lab 07/23/24 1136 07/24/24 0506 07/25/24 0554 07/26/24 0249 07/27/24 0622  WBC 11.9* 8.1 11.3* 11.2* 10.7*  NEUTROABS 10.2*  --   --   --   --   HGB 13.0 10.2* 8.1* 7.2* 9.9*  HCT 41.2 32.0* 25.9* 23.5* 29.7*  MCV 92.0 92.0 93.2 94.4 90.5  PLT 221 169 162 155 175   Cardiac Enzymes: Recent Labs  Lab 07/23/24 1221  CKTOTAL 432*   BNP (last 3 results) No results for input(s): PROBNP in the last 8760 hours. CBG: Recent Labs  Lab 07/23/24 1845 07/24/24 0738 07/24/24 1120 07/24/24 1507 07/25/24 0937  GLUCAP 100* 125* 101* 126* 132*   D-Dimer: No results for input(s): DDIMER in the last 72 hours. Hgb A1c: No results for input(s): HGBA1C in the last 72 hours. Lipid Profile: No results for input(s): CHOL, HDL, LDLCALC, TRIG, CHOLHDL, LDLDIRECT in the last 72 hours. Thyroid function studies: No results for input(s): TSH, T4TOTAL, T3FREE, THYROIDAB in the last 72 hours.  Invalid input(s): FREET3 Anemia work up: No results for input(s): VITAMINB12, FOLATE, FERRITIN, TIBC, IRON, RETICCTPCT in the last 72 hours.  Sepsis Labs: Recent Labs  Lab 07/23/24 2312 07/24/24 0506 07/25/24 0554 07/26/24 0249 07/27/24 0622  PROCALCITON <0.10  --   --   --   --   WBC  --  8.1 11.3* 11.2* 10.7*    Microbiology Recent Results (from the past 240 hours)  Resp panel by RT-PCR (RSV, Flu A&B, Covid) Anterior Nasal Swab     Status: None   Collection Time: 07/23/24  9:53 PM   Specimen: Anterior Nasal Swab  Result Value Ref Range Status   SARS Coronavirus 2 by RT PCR NEGATIVE NEGATIVE Final    Comment: (NOTE) SARS-CoV-2 target nucleic acids are NOT DETECTED.  The SARS-CoV-2 RNA is generally detectable in upper respiratory specimens during the acute phase of infection.  The lowest concentration of SARS-CoV-2 viral copies this assay can detect is 138 copies/mL. A negative result does not preclude SARS-Cov-2 infection and should not be used as the sole basis for treatment or other patient management decisions. A negative result may occur with  improper specimen collection/handling, submission of specimen other than nasopharyngeal swab, presence of viral mutation(s) within the areas targeted by this assay, and inadequate number of viral copies(<138 copies/mL). A negative result must be combined with clinical observations, patient history, and epidemiological information. The expected result is Negative.  Fact Sheet for Patients:  BloggerCourse.com  Fact Sheet for Healthcare Providers:  SeriousBroker.it  This test is no t yet approved or cleared by the [place]  FDA and  has been authorized for detection and/or diagnosis of SARS-CoV-2 by FDA under an Emergency Use Authorization (EUA). This EUA will remain  in effect (meaning this test can be used) for the duration of the COVID-19 declaration under Section 564(b)(1) of the Act, 21 U.S.C.section 360bbb-3(b)(1), unless the authorization is terminated  or revoked sooner.       Influenza A by PCR NEGATIVE NEGATIVE Final   Influenza B by PCR NEGATIVE NEGATIVE Final    Comment: (NOTE) The Xpert Xpress SARS-CoV-2/FLU/RSV plus assay is intended as an aid in the diagnosis of influenza from Nasopharyngeal swab specimens and should not be used as a sole basis for treatment. Nasal washings and aspirates are unacceptable for Xpert Xpress SARS-CoV-2/FLU/RSV testing.  Fact Sheet for Patients: BloggerCourse.com  Fact Sheet for Healthcare Providers: SeriousBroker.it  This test is not yet approved  or cleared by the [place]  FDA and has been authorized for detection and/or diagnosis of SARS-CoV-2 by FDA  under an Emergency Use Authorization (EUA). This EUA will remain in effect (meaning this test can be used) for the duration of the COVID-19 declaration under Section 564(b)(1) of the Act, 21 U.S.C. section 360bbb-3(b)(1), unless the authorization is terminated or revoked.     Resp Syncytial Virus by PCR NEGATIVE NEGATIVE Final    Comment: (NOTE) Fact Sheet for Patients: BloggerCourse.com  Fact Sheet for Healthcare Providers: SeriousBroker.it  This test is not yet approved or cleared by the [place]  FDA and has been authorized for detection and/or diagnosis of SARS-CoV-2 by FDA under an Emergency Use Authorization (EUA). This EUA will remain in effect (meaning this test can be used) for the duration of the COVID-19 declaration under Section 564(b)(1) of the Act, 21 U.S.C. section 360bbb-3(b)(1), unless the authorization is terminated or revoked.  Performed at [facility], [address] [place], [place] [address]     Procedures and diagnostic studies:  No results found.              LOS: 6 days   Weronika Birch  [facility]   Pager on [email or web address]. If 7PM-7AM, please contact night-coverage at [email or web address]     07/29/2024, 12:49 PM

## 2024-07-30 DIAGNOSIS — S72002A Fracture of unspecified part of neck of left femur, initial encounter for closed fracture: Secondary | ICD-10-CM | POA: Diagnosis not present

## 2024-07-30 LAB — GLUCOSE, CAPILLARY: Glucose-Capillary: 93 mg/dL (ref 70–99)

## 2024-07-30 NOTE — Plan of Care (Signed)

## 2024-07-30 NOTE — Progress Notes (Signed)
 Progress Note    [name] Griffin  FMW:[id number] DOB: [birth year]  DOA: 07/23/2024 PCP: Fernand Fredy RAMAN, MD      Brief Narrative:    Medical records reviewed and are as summarized below:  [name] Griffin is a 89 y.o. female with medical history significant of HTN, HLD, diet-controlled DM, gout, CKD-3A, memory loss, hard of hearing, who presented to the emergency department with left hip pain following a fall at home.     Assessment/Plan:   Principal Problem:   Closed left hip fracture (HCC) Active Problems:   Fall at home, initial encounter   Oxygen desaturation   Elevated CK   Essential hypertension, benign   Type II diabetes mellitus with renal manifestations (HCC)   Chronic kidney disease, stage 3a (HCC)   Idiopathic gout   Memory impairment   Syncope   Obesity (BMI 30-39.9)   Acute blood loss anemia   Nutrition Problem: Increased nutrient needs Etiology: post-op healing  Signs/Symptoms: estimated needs   Body mass index is 30.54 kg/m.  (Class I obesity)    Closed left hip fracture and pelvic fracture: Fall at home. Syncope/presyncope. S/p ORIF left hip on 07/24/2024.  Analgesics as needed for pain. X-ray showed  comminuted intertrochanteric fracture in the proximal left femur with 8 mm displacement of the lesser trochanter. Patient had episodes of syncope/presyncope at home, etiology is unclear.   Telemetry did not show any arrhythmia 2D echo showed EF estimated at 60 to 65%, mild LVH, grade 1 diastolic dysfunction, moderately elevated pulmonary artery systolic pressure, mild to moderate TR   Acute postoperative blood loss anemia. Hemoglobin up from 7.2-9.9.  S/p transfusion with 1 unit of PRBCs on 07/26/2024. Hemoglobin went down from 13-10.2-8.1-7.2.   Oxygen desaturation:  Resolved   Elevated CK: CK mildly elevated 432. -pt received 500 cc normal saline This is traumatic from fall.   Probable AKI on CKD stage IIIa: Fluctuating creatinine  and difficult to determine exact stage of kidney disease   Low normal vitamin B12: Vitamin B12 level was 219.  Continue vitamin B12 supplement.    Comorbidities include hypertension, diet-controlled type II DM, , gout, cognitive impairment.     She is medically stable.  Awaiting placement to SNF   Diet Order             Diet - low sodium heart healthy           Diet regular Fluid consistency: Thin  Diet effective now                                  Consultants: Orthopedic surgeon  Procedures: ORIF left hip on 07/24/2024    Medications:    sodium chloride    Intravenous Once   sodium chloride    Intravenous Once   allopurinol   100 mg Oral Daily   aspirin  EC  325 mg Oral Daily   atenolol   25 mg Oral Daily   vitamin B-12  1,000 mcg Oral Daily   donepezil   10 mg Oral QHS   feeding supplement  237 mL Oral BID BM   furosemide   20 mg Oral Daily   lidocaine  (PF)  10 mL Other Once   lisinopril   5 mg Oral Daily   multivitamin with minerals  1 tablet Oral Daily   triamcinolone  acetonide  80 mg Intra-articular Once   Continuous Infusions:   Anti-infectives (From admission, onward)  Start     Dose/Rate Route Frequency Ordered Stop   07/24/24 1230  ceFAZolin  (ANCEF ) IVPB 2g/100 mL premix  Status:  Discontinued       Note to Pharmacy: To be administered intravenously immediately before surgery and not to be sent to the floor.   2 g 200 mL/hr over 30 Minutes Intravenous Every 6 hours 07/24/24 1224 07/24/24 1716   07/24/24 1000  azithromycin  (ZITHROMAX ) tablet 250 mg       Placed in Followed by Linked Group   250 mg Oral Daily 07/23/24 2030 07/27/24 0937   07/23/24 2130  azithromycin  (ZITHROMAX ) tablet 500 mg       Placed in Followed by Linked Group   500 mg Oral Daily 07/23/24 2030 07/23/24 2223              Family Communication/Anticipated D/C date and plan/Code Status   DVT prophylaxis: SCDs Start: 07/24/24 1538 SCDs Start: 07/23/24  1848     Code Status: Full Code  Family Communication: Plan discussed with [name], son, at the bedside Disposition Plan: Plan to discharge to SNF   Status is: Inpatient Remains inpatient appropriate because: Awaiting placement to SNF       Subjective:   No acute events overnight.  She feels fine and has no complaints.  [name] (son) and his girlfriend were at the bedside.  Objective:    Vitals:   07/29/24 1952 07/30/24 0355 07/30/24 0705 07/30/24 0759  BP: (!) 112/59 (!) 107/53  (!) 154/73  Pulse: 68 72  75  Resp:   16 17  Temp: 98 F (36.7 C) 97.9 F (36.6 C)  98.2 F (36.8 C)  TempSrc:    Oral  SpO2: 97% 97%  98%  Weight:      Height:       No data found.   Intake/Output Summary (Last 24 hours) at 07/30/2024 1431 Last data filed at 07/30/2024 1046 Gross per 24 hour  Intake 240 ml  Output 400 ml  Net -160 ml   Filed Weights   07/23/24 1106 07/24/24 1122  Weight: 78.2 kg 78.2 kg    Exam:  GEN: NAD SKIN: Warm and dry EYES: No pallor or icterus ENT: MMM CV: RRR PULM: CTA B ABD: soft, ND, NT, +BS CNS: AAO x person, place and situation, non focal EXT: No edema or tenderness      Data Reviewed:   I have personally reviewed following labs and imaging studies:  Labs: Labs show the following:   Basic Metabolic Panel: Recent Labs  Lab 07/24/24 0506 07/25/24 0554 07/26/24 0249 07/27/24 0622  NA 138 138 136 136  K 4.5 4.5 4.6 5.1  CL 104 101 101 101  CO2 27 26 27 28   GLUCOSE 119* 129* 109* 164*  BUN 30* 39* 55* 50*  CREATININE 1.24* 1.41* 1.51* 1.18*  CALCIUM 8.7* 8.5* 8.5* 8.8*  MG  --  1.9  --   --    GFR Estimated Creatinine Clearance: 29.5 mL/min (A) (by C-G formula based on SCr of 1.18 mg/dL (H)). Liver Function Tests: No results for input(s): AST, ALT, ALKPHOS, BILITOT, PROT, ALBUMIN in the last 168 hours.  No results for input(s): LIPASE, AMYLASE in the last 168 hours. No results for input(s): AMMONIA in the last  168 hours. Coagulation profile No results for input(s): INR, PROTIME in the last 168 hours.   CBC: Recent Labs  Lab 07/24/24 0506 07/25/24 0554 07/26/24 0249 07/27/24 0622  WBC 8.1 11.3* 11.2* 10.7*  HGB 10.2* 8.1* 7.2* 9.9*  HCT 32.0* 25.9* 23.5* 29.7*  MCV 92.0 93.2 94.4 90.5  PLT 169 162 155 175   Cardiac Enzymes: No results for input(s): CKTOTAL, CKMB, CKMBINDEX, TROPONINI in the last 168 hours.  BNP (last 3 results) No results for input(s): PROBNP in the last 8760 hours. CBG: Recent Labs  Lab 07/24/24 0738 07/24/24 1120 07/24/24 1507 07/25/24 0937 07/30/24 0800  GLUCAP 125* 101* 126* 132* 93   D-Dimer: No results for input(s): DDIMER in the last 72 hours. Hgb A1c: No results for input(s): HGBA1C in the last 72 hours. Lipid Profile: No results for input(s): CHOL, HDL, LDLCALC, TRIG, CHOLHDL, LDLDIRECT in the last 72 hours. Thyroid function studies: No results for input(s): TSH, T4TOTAL, T3FREE, THYROIDAB in the last 72 hours.  Invalid input(s): FREET3 Anemia work up: No results for input(s): VITAMINB12, FOLATE, FERRITIN, TIBC, IRON, RETICCTPCT in the last 72 hours.  Sepsis Labs: Recent Labs  Lab 07/23/24 2312 07/24/24 0506 07/25/24 0554 07/26/24 0249 07/27/24 0622  PROCALCITON <0.10  --   --   --   --   WBC  --  8.1 11.3* 11.2* 10.7*    Microbiology Recent Results (from the past 240 hours)  Resp panel by RT-PCR (RSV, Flu A&B, Covid) Anterior Nasal Swab     Status: None   Collection Time: 07/23/24  9:53 PM   Specimen: Anterior Nasal Swab  Result Value Ref Range Status   SARS Coronavirus 2 by RT PCR NEGATIVE NEGATIVE Final    Comment: (NOTE) SARS-CoV-2 target nucleic acids are NOT DETECTED.  The SARS-CoV-2 RNA is generally detectable in upper respiratory specimens during the acute phase of infection. The lowest concentration of SARS-CoV-2 viral copies this assay can detect is 138 copies/mL. A  negative result does not preclude SARS-Cov-2 infection and should not be used as the sole basis for treatment or other patient management decisions. A negative result may occur with  improper specimen collection/handling, submission of specimen other than nasopharyngeal swab, presence of viral mutation(s) within the areas targeted by this assay, and inadequate number of viral copies(<138 copies/mL). A negative result must be combined with clinical observations, patient history, and epidemiological information. The expected result is Negative.  Fact Sheet for Patients:  BloggerCourse.com  Fact Sheet for Healthcare Providers:  SeriousBroker.it  This test is no t yet approved or cleared by the [place]  FDA and  has been authorized for detection and/or diagnosis of SARS-CoV-2 by FDA under an Emergency Use Authorization (EUA). This EUA will remain  in effect (meaning this test can be used) for the duration of the COVID-19 declaration under Section 564(b)(1) of the Act, 21 U.S.C.section 360bbb-3(b)(1), unless the authorization is terminated  or revoked sooner.       Influenza A by PCR NEGATIVE NEGATIVE Final   Influenza B by PCR NEGATIVE NEGATIVE Final    Comment: (NOTE) The Xpert Xpress SARS-CoV-2/FLU/RSV plus assay is intended as an aid in the diagnosis of influenza from Nasopharyngeal swab specimens and should not be used as a sole basis for treatment. Nasal washings and aspirates are unacceptable for Xpert Xpress SARS-CoV-2/FLU/RSV testing.  Fact Sheet for Patients: BloggerCourse.com  Fact Sheet for Healthcare Providers: SeriousBroker.it  This test is not yet approved or cleared by the [place]  FDA and has been authorized for detection and/or diagnosis of SARS-CoV-2 by FDA under an Emergency Use Authorization (EUA). This EUA will remain in effect (meaning this test can  be used) for the duration of the COVID-19  declaration under Section 564(b)(1) of the Act, 21 U.S.C. section 360bbb-3(b)(1), unless the authorization is terminated or revoked.     Resp Syncytial Virus by PCR NEGATIVE NEGATIVE Final    Comment: (NOTE) Fact Sheet for Patients: BloggerCourse.com  Fact Sheet for Healthcare Providers: SeriousBroker.it  This test is not yet approved or cleared by the [place]  FDA and has been authorized for detection and/or diagnosis of SARS-CoV-2 by FDA under an Emergency Use Authorization (EUA). This EUA will remain in effect (meaning this test can be used) for the duration of the COVID-19 declaration under Section 564(b)(1) of the Act, 21 U.S.C. section 360bbb-3(b)(1), unless the authorization is terminated or revoked.  Performed at [facility], [address] [place], [place] [address]     Procedures and diagnostic studies:  No results found.              LOS: 7 days   Mickeal Daws  [facility]   Pager on [email or web address]. If 7PM-7AM, please contact night-coverage at [email or web address]     07/30/2024, 2:31 PM

## 2024-07-30 NOTE — Progress Notes (Addendum)
 Subjective: 6 Days Post-Op Procedure(s) (LRB): OPEN REDUCTION INTERNAL FIXATION HIP (Left) Patient reports left hip pain as mild. Difficult historian. Hx of dementia. Slow progress of physical therapy, improving. No CP, SOB, fevers or chills Continue to work with PT today Plan to DC to SNF  Objective: Vital signs in last 24 hours: Temp:  [97.9 F (36.6 C)-98.2 F (36.8 C)] 98.2 F (36.8 C) (08/17 0759) Pulse Rate:  [68-75] 75 (08/17 0759) Resp:  [16-17] 17 (08/17 0759) BP: (107-154)/(53-73) 154/73 (08/17 0759) SpO2:  [97 %-98 %] 98 % (08/17 0759)  Intake/Output from previous day: No intake/output data recorded. Intake/Output this shift: Total I/O In: 240 [P.O.:240] Out: 400 [Urine:400]  No results for input(s): HGB in the last 72 hours.  No results for input(s): WBC, RBC, HCT, PLT in the last 72 hours.  No results for input(s): NA, K, CL, CO2, BUN, CREATININE, GLUCOSE, CALCIUM in the last 72 hours.  No results for input(s): LABPT, INR in the last 72 hours.  EXAM General - Patient is Alert, Appropriate, and Oriented Left hip/lower extremity - Sensation intact distally Intact pulses distally Dorsiflexion/Plantar flexion intact Dressing - both proximal and distal dressings wet and lost adhesive. Area cleaned with alcohol prep and redressed with 2 new honeycomb bandages Motor Function - intact, moving foot and toes well on exam.   Past Medical History:  Diagnosis Date   CKD (chronic kidney disease)    Diabetes mellitus without complication (HCC)    Essential hypertension, benign    Mixed hyperlipidemia     Assessment/Plan:   6 Days Post-Op Procedure(s) (LRB): OPEN REDUCTION INTERNAL FIXATION HIP (Left) Principal Problem:   Closed left hip fracture (HCC) Active Problems:   Idiopathic gout   Essential hypertension, benign   Memory impairment   Fall at home, initial encounter   Type II diabetes mellitus with renal manifestations  (HCC)   Chronic kidney disease, stage 3a (HCC)   Oxygen desaturation   Obesity (BMI 30-39.9)   Elevated CK   Syncope   Acute blood loss anemia  Estimated body mass index is 30.54 kg/m as calculated from the following:   Height as of this encounter: 5' 3 (1.6 m).   Weight as of this encounter: 78.2 kg. Advance diet Up with therapy - WBAT Pain well controlled VSS Last Hgb at 9.9 after transfusion of 1 unit pRBC on 07/26/2024.  WBC trending down at 10.7  Pain well-controlled with Tylenol .  New honeycomb bandages applied  Ortho will sign off at this point, we will follow this patient remotely. Please feel free to re-consult if any questions or concerns develop with this patient's care  Follow up with [facility] ortho in 2 weeks, staples need to be removed at 2 weeks  DVT Prophylaxis - Aspirin , TED hose, and SCDs Weight-Bearing as tolerated to left leg   Fonda CHARLENA Koyanagi, PA-C [facility] 07/30/2024, 12:11 PM

## 2024-07-31 DIAGNOSIS — M6281 Muscle weakness (generalized): Secondary | ICD-10-CM | POA: Diagnosis not present

## 2024-07-31 DIAGNOSIS — R5381 Other malaise: Secondary | ICD-10-CM | POA: Diagnosis not present

## 2024-07-31 DIAGNOSIS — E538 Deficiency of other specified B group vitamins: Secondary | ICD-10-CM | POA: Diagnosis not present

## 2024-07-31 DIAGNOSIS — E1129 Type 2 diabetes mellitus with other diabetic kidney complication: Secondary | ICD-10-CM | POA: Diagnosis not present

## 2024-07-31 DIAGNOSIS — M25552 Pain in left hip: Secondary | ICD-10-CM | POA: Diagnosis not present

## 2024-07-31 DIAGNOSIS — R41841 Cognitive communication deficit: Secondary | ICD-10-CM | POA: Diagnosis not present

## 2024-07-31 DIAGNOSIS — S72002A Fracture of unspecified part of neck of left femur, initial encounter for closed fracture: Secondary | ICD-10-CM | POA: Diagnosis not present

## 2024-07-31 DIAGNOSIS — S72142D Displaced intertrochanteric fracture of left femur, subsequent encounter for closed fracture with routine healing: Secondary | ICD-10-CM | POA: Diagnosis not present

## 2024-07-31 DIAGNOSIS — N183 Chronic kidney disease, stage 3 unspecified: Secondary | ICD-10-CM | POA: Diagnosis not present

## 2024-07-31 DIAGNOSIS — R4189 Other symptoms and signs involving cognitive functions and awareness: Secondary | ICD-10-CM | POA: Diagnosis not present

## 2024-07-31 DIAGNOSIS — H9190 Unspecified hearing loss, unspecified ear: Secondary | ICD-10-CM | POA: Diagnosis not present

## 2024-07-31 DIAGNOSIS — M109 Gout, unspecified: Secondary | ICD-10-CM | POA: Diagnosis not present

## 2024-07-31 DIAGNOSIS — R262 Difficulty in walking, not elsewhere classified: Secondary | ICD-10-CM | POA: Diagnosis not present

## 2024-07-31 DIAGNOSIS — E785 Hyperlipidemia, unspecified: Secondary | ICD-10-CM | POA: Diagnosis not present

## 2024-07-31 DIAGNOSIS — D62 Acute posthemorrhagic anemia: Secondary | ICD-10-CM | POA: Diagnosis not present

## 2024-07-31 DIAGNOSIS — I10 Essential (primary) hypertension: Secondary | ICD-10-CM | POA: Diagnosis not present

## 2024-07-31 DIAGNOSIS — H9113 Presbycusis, bilateral: Secondary | ICD-10-CM | POA: Diagnosis not present

## 2024-07-31 DIAGNOSIS — R269 Unspecified abnormalities of gait and mobility: Secondary | ICD-10-CM | POA: Diagnosis not present

## 2024-07-31 DIAGNOSIS — I959 Hypotension, unspecified: Secondary | ICD-10-CM | POA: Diagnosis not present

## 2024-07-31 DIAGNOSIS — E1122 Type 2 diabetes mellitus with diabetic chronic kidney disease: Secondary | ICD-10-CM | POA: Diagnosis not present

## 2024-07-31 DIAGNOSIS — Z7401 Bed confinement status: Secondary | ICD-10-CM | POA: Diagnosis not present

## 2024-07-31 DIAGNOSIS — D649 Anemia, unspecified: Secondary | ICD-10-CM | POA: Diagnosis not present

## 2024-07-31 DIAGNOSIS — I700 Atherosclerosis of aorta: Secondary | ICD-10-CM | POA: Diagnosis not present

## 2024-07-31 DIAGNOSIS — Z9181 History of falling: Secondary | ICD-10-CM | POA: Diagnosis not present

## 2024-07-31 LAB — GLUCOSE, CAPILLARY: Glucose-Capillary: 97 mg/dL (ref 70–99)

## 2024-07-31 NOTE — Discharge Summary (Addendum)
 Physician Discharge Summary   Patient: [name] Griffin MRN: [id number] DOB: 1928/04/24  Admit date:     07/23/2024  Discharge date: 07/31/24  Discharge Physician: AIDA CHO   PCP: [name] Fredy RAMAN, MD   Recommendations at discharge:   Follow-up with orthopedic surgeon at [facility] clinic in 2 weeks Follow-up with physician at the nursing home within 3 days of discharge  Discharge Diagnoses: Principal Problem:   Closed left hip fracture [facility]) Active Problems:   Fall at home, initial encounter   Oxygen desaturation   Elevated CK   Essential hypertension, benign   Type II diabetes mellitus with renal manifestations (HCC)   Chronic kidney disease, stage 3a (HCC)   Idiopathic gout   Memory impairment   Syncope   Obesity (BMI 30-39.9)   Acute blood loss anemia  Resolved Problems:   * No resolved hospital problems. *  Hospital Course:  [name] Griffin is a 89 y.o. female with medical history significant of HTN, HLD, diet-controlled DM, gout, CKD-3A, memory loss, hard of hearing, who presented to the emergency department with left hip pain following a fall at home.      Assessment and Plan:  Closed left hip fracture and pelvic fracture: Fall at home. Syncope/presyncope. S/p ORIF left hip on 07/24/2024.  Analgesics as needed for pain. X-ray showed  comminuted intertrochanteric fracture in the proximal left femur with 8 mm displacement of the lesser trochanter. Patient had episodes of syncope/presyncope at home, etiology is unclear.   Telemetry did not show any arrhythmia 2D echo showed EF estimated at 60 to 65%, mild LVH, grade 1 diastolic dysfunction, moderately elevated pulmonary artery systolic pressure, mild to moderate TR     Acute postoperative blood loss anemia. Hemoglobin up from 7.2-9.9.  S/p transfusion with 1 unit of PRBCs on 07/26/2024. Hemoglobin went down from 13-10.2-8.1-7.2.     Oxygen desaturation:  Resolved     Elevated CK: CK mildly elevated  432. -pt received 500 cc normal saline This is traumatic from fall.     Probable AKI on CKD stage IIIa: Fluctuating creatinine and difficult to determine exact stage of kidney disease     Low normal vitamin B12: Vitamin B12 level was 219.  Continue vitamin B12 supplement.     Comorbidities include hypertension, diet-controlled type II DM, , gout, cognitive impairment.      Condition has improved and she is deemed stable for discharge to home today.  Discharge plan was discussed with [name] Griffin (son) and [name] Griffin's girlfriend at the bedside.        Consultants: Orthopedic surgeon Procedures performed: ORIF left hip on 07/24/2024   Disposition: Skilled nursing facility Diet recommendation:  Discharge Diet Orders (From admission, onward)     Start     Ordered   07/28/24 0000  Diet - low sodium heart healthy        07/28/24 1310           Cardiac diet DISCHARGE MEDICATION: Allergies as of 07/31/2024   No Known Allergies      Medication List     TAKE these medications    acetaminophen  325 MG tablet Commonly known as: TYLENOL  Take 2 tablets (650 mg total) by mouth every 6 (six) hours as needed for mild pain (pain score 1-3) or fever.   allopurinol  100 MG tablet Commonly known as: ZYLOPRIM  TAKE 1 TABLET BY MOUTH EVERY DAY   aspirin  EC 325 MG tablet Take 1 tablet (325 mg total) by mouth daily.  atenolol  25 MG tablet Commonly known as: TENORMIN  TAKE 1 TABLET BY MOUTH EVERY DAY   cyanocobalamin  1000 MCG tablet Take 1 tablet (1,000 mcg total) by mouth daily.   donepezil  10 MG tablet Commonly known as: Aricept  Take 1 tablet (10 mg total) by mouth at bedtime.   furosemide  20 MG tablet Commonly known as: LASIX  TAKE 1 TABLET BY MOUTH EVERY DAY   lisinopril  5 MG tablet Commonly known as: ZESTRIL  TAKE 1 TABLET BY MOUTH EVERY DAY   senna-docusate 8.6-50 MG tablet Commonly known as: Senokot-S Take 1 tablet by mouth at bedtime as needed for mild constipation.                Discharge Care Instructions  (From admission, onward)           Start     Ordered   07/28/24 0000  Discharge wound care:       Comments: Follow-up with orthopedic surgeon for incisional wound check   07/28/24 1310            Contact information for follow-up providers     [name] Debby BROCKS, PA-C Follow up in 2 week(s).   Specialties: Orthopedic Surgery, Emergency Medicine Contact information: [address] [place] [address] [phone]              Contact information for after-discharge care     Destination     [facility] .   Service: Skilled Nursing Contact information: [address] [place]  [address] [phone]                    Discharge Exam: Filed Weights   07/23/24 1106 07/24/24 1122  Weight: 78.2 kg 78.2 kg   GEN: NAD SKIN: Warm and dry EYES: No pallor or icterus ENT: MMM CV: RRR PULM: CTA B ABD: soft, ND, NT, +BS CNS: Alert and oriented to person and place, non focal EXT: Honeycomb bandage on left hip surgical wound is dry and intact   Condition at discharge: good  The results of significant diagnostics from this hospitalization (including imaging, microbiology, ancillary and laboratory) are listed below for reference.   Imaging Studies: ECHOCARDIOGRAM COMPLETE Result Date: 07/26/2024    ECHOCARDIOGRAM REPORT   Patient Name:   [name] Griffin [facility] Date of Exam: 07/25/2024 Medical Rec #:  [id number]       Height:       63.0 in Accession #:    [id number]      Weight:       172.4 lb Date of Birth:  14-Jul-1928       BSA:          1.815 m Patient Age:    89 years        BP:           98/39 mmHg Patient Gender: F               HR:           69 bpm. Exam Location:  [facility] Procedure: 2D Echo, Cardiac Doppler and Color Doppler (Both Spectral and Color            Flow Doppler were utilized during procedure). Indications:     Syncope R55  History:         Patient has no prior history  of Echocardiogram examinations.                  CKD, stage  3, Signs/Symptoms:Syncope; Risk                  Factors:Hypertension, Diabetes and Dyslipidemia.  Sonographer:     [name] Griffin RCS Referring Phys:  [id number] [name] Griffin Diagnosing Phys: Evalene Lunger MD IMPRESSIONS  1. Left ventricular ejection fraction, by estimation, is 60 to 65%. The left ventricle has normal function. The left ventricle has no regional wall motion abnormalities. There is mild left ventricular hypertrophy. Left ventricular diastolic parameters are consistent with Grade I diastolic dysfunction (impaired relaxation).  2. Right ventricular systolic function is normal. The right ventricular size is normal. There is moderately elevated pulmonary artery systolic pressure. The estimated right ventricular systolic pressure is 45.4 mmHg.  3. The mitral valve is normal in structure. No evidence of mitral valve regurgitation. No evidence of mitral stenosis.  4. Tricuspid valve regurgitation is mild to moderate.  5. The aortic valve is normal in structure. Aortic valve regurgitation is not visualized. No aortic stenosis is present.  6. The inferior vena cava is normal in size with greater than 50% respiratory variability, suggesting right atrial pressure of 3 mmHg. FINDINGS  Left Ventricle: Left ventricular ejection fraction, by estimation, is 60 to 65%. The left ventricle has normal function. The left ventricle has no regional wall motion abnormalities. Strain was performed and the global longitudinal strain is indeterminate. The left ventricular internal cavity size was normal in size. There is mild left ventricular hypertrophy. Left ventricular diastolic parameters are consistent with Grade I diastolic dysfunction (impaired relaxation). Right Ventricle: The right ventricular size is normal. No increase in right ventricular wall thickness. Right ventricular systolic function is normal. There is moderately elevated pulmonary artery systolic  pressure. The tricuspid regurgitant velocity is 3.18 m/s, and with an assumed right atrial pressure of 5 mmHg, the estimated right ventricular systolic pressure is 45.4 mmHg. Left Atrium: Left atrial size was normal in size. Right Atrium: Right atrial size was normal in size. Pericardium: There is no evidence of pericardial effusion. Mitral Valve: The mitral valve is normal in structure. No evidence of mitral valve regurgitation. No evidence of mitral valve stenosis. Tricuspid Valve: The tricuspid valve is normal in structure. Tricuspid valve regurgitation is mild to moderate. No evidence of tricuspid stenosis. Aortic Valve: The aortic valve is normal in structure. Aortic valve regurgitation is not visualized. No aortic stenosis is present. Aortic valve peak gradient measures 13.4 mmHg. Pulmonic Valve: The pulmonic valve was normal in structure. Pulmonic valve regurgitation is not visualized. No evidence of pulmonic stenosis. Aorta: The aortic root is normal in size and structure. Venous: The inferior vena cava is normal in size with greater than 50% respiratory variability, suggesting right atrial pressure of 3 mmHg. IAS/Shunts: No atrial level shunt detected by color flow Doppler. Additional Comments: 3D was performed not requiring image post processing on an independent workstation and was indeterminate.  LEFT VENTRICLE PLAX 2D LVIDd:         3.20 cm   Diastology LVIDs:         2.20 cm   LV e' medial:    5.00 cm/s LV PW:         1.20 cm   LV E/e' medial:  20.6 LV IVS:        1.00 cm   LV e' lateral:   7.18 cm/s LVOT diam:     2.00 cm   LV E/e' lateral: 14.3 LV SV:         85 LV SV Index:  47 LVOT Area:     3.14 cm  RIGHT VENTRICLE             IVC RV S prime:     12.10 cm/s  IVC diam: 1.80 cm TAPSE (M-mode): 2.2 cm LEFT ATRIUM             Index        RIGHT ATRIUM          Index LA diam:        2.50 cm 1.38 cm/m   RA Area:     8.16 cm LA Vol (A2C):   30.8 ml 16.97 ml/m  RA Volume:   13.70 ml 7.55 ml/m LA Vol  (A4C):   34.8 ml 19.17 ml/m LA Biplane Vol: 32.8 ml 18.07 ml/m  AORTIC VALVE AV Area (Vmax): 2.15 cm AV Vmax:        183.00 cm/s AV Peak Grad:   13.4 mmHg LVOT Vmax:      125.00 cm/s LVOT Vmean:     84.200 cm/s LVOT VTI:       0.269 m  AORTA Ao Root diam: 3.30 cm Ao Asc diam:  3.30 cm MITRAL VALVE                TRICUSPID VALVE MV Area (PHT): 3.17 cm     TR Peak grad:   40.4 mmHg MV Decel Time: 239 msec     TR Vmax:        318.00 cm/s MV E velocity: 103.00 cm/s MV A velocity: 114.00 cm/s  SHUNTS MV E/A ratio:  0.90         Systemic VTI:  0.27 m                             Systemic Diam: 2.00 cm Evalene Lunger MD Electronically signed by Evalene Lunger MD Signature Date/Time: 07/26/2024/4:39:33 PM    Final    DG Knee Right Port Result Date: 07/25/2024 CLINICAL DATA:  Recent fall with knee pain, initial encounter EXAM: PORTABLE RIGHT KNEE - 1-2 VIEW COMPARISON:  None Available. FINDINGS: Degenerative changes are noted worst in the medial joint space. Minimal joint effusion is seen. No fracture or dislocation is noted. IMPRESSION: Degenerative change without acute abnormality. Electronically Signed   By: [name] Griffin Devonshire M.D.   On: 07/25/2024 22:05   DG Knee Left Port Result Date: 07/25/2024 CLINICAL DATA:  Recent fall with knee pain, initial encounter EXAM: PORTABLE LEFT KNEE - 2 VIEW COMPARISON:  None Available. FINDINGS: Postsurgical changes are noted in the distal left femur. Severe degenerative changes of the knee joint are noted particularly in the medial joint space. No joint effusion is seen. No acute fracture is noted. IMPRESSION: Degenerative changes without acute abnormality. Electronically Signed   By: [name] Griffin Devonshire M.D.   On: 07/25/2024 22:05   DG HIP UNILAT WITH PELVIS 2-3 VIEWS LEFT Result Date: 07/24/2024 CLINICAL DATA:  Elective surgery peer EXAM: DG HIP (WITH OR WITHOUT PELVIS) 2-3V LEFT COMPARISON:  Preoperative radiograph FINDINGS: Four fluoroscopic spot views of the hip and femur submitted  from the operating room. Sidedness is not labeled on submitted exams. Femoral intramedullary nail with distal and trans trochanteric screw fixation traverse proximal femur fracture. Fluoroscopy time 2 minutes 7 seconds. Dose 20.5 mGy. IMPRESSION: Intraoperative fluoroscopy during proximal femur fracture ORIF. Electronically Signed   By: Andrea Gasman M.D.   On: 07/24/2024 15:36   DG C-Arm 1-60 Min-No Report  Result Date: 07/24/2024 Fluoroscopy was utilized by the requesting physician.  No radiographic interpretation.   DG C-Arm 1-60 Min-No Report Result Date: 07/24/2024 Fluoroscopy was utilized by the requesting physician.  No radiographic interpretation.   CT Angio Chest PE W and/or Wo Contrast Result Date: 07/23/2024 CLINICAL DATA:  Recent fall with chest pain, initial encounter EXAM: CT ANGIOGRAPHY CHEST WITH CONTRAST TECHNIQUE: Multidetector CT imaging of the chest was performed using the standard protocol during bolus administration of intravenous contrast. Multiplanar CT image reconstructions and MIPs were obtained to evaluate the vascular anatomy. RADIATION DOSE REDUCTION: This exam was performed according to the departmental dose-optimization program which includes automated exposure control, adjustment of the mA and/or kV according to patient size and/or use of iterative reconstruction technique. CONTRAST:  75mL OMNIPAQUE  IOHEXOL  350 MG/ML SOLN COMPARISON:  Chest x-ray from earlier in the same day. FINDINGS: Cardiovascular: Thoracic aorta shows atherosclerotic calcifications without aneurysmal dilatation or dissection. Mild cardiac enlargement is noted. The pulmonary artery shows a normal branching pattern bilaterally. No definitive filling defects to suggest pulmonary embolism are noted. Mediastinum/Nodes: Thoracic shows significant enlargement of the left lobe of the thyroid with a dominant 4.6 cm lesion. No hilar or mediastinal adenopathy is noted. The esophagus as visualized is within normal  limits. Lungs/Pleura: Lungs are well aerated bilaterally. Some areas of air trapping are seen. Atelectatic changes are noted in the bases bilaterally worse on the left than the right similar to that seen on recent chest x-ray. No effusion or pneumothorax is seen. Upper Abdomen: As evaluated on concomitant CT of the abdomen and pelvis. Musculoskeletal: Degenerative changes of the thoracic spine are noted. No acute rib abnormality is noted. Review of the MIP images confirms the above findings. IMPRESSION: No evidence of pulmonary emboli. Atelectatic changes are noted in bases left greater than right similar to that seen on prior chest x-ray. Left thyroid nodule as described. Given the patient's advanced age no further follow-up is recommended. Aortic Atherosclerosis (ICD10-I70.0) and Emphysema (ICD10-J43.9). Electronically Signed   By: [name] Griffin Devonshire M.D.   On: 07/23/2024 19:41   CT Cervical Spine Wo Contrast Result Date: 07/23/2024 CLINICAL DATA:  Neck trauma (Age >= 65y) 89 year old post unwitnessed fall. EXAM: CT CERVICAL SPINE WITHOUT CONTRAST TECHNIQUE: Multidetector CT imaging of the cervical spine was performed without intravenous contrast. Multiplanar CT image reconstructions were also generated. RADIATION DOSE REDUCTION: This exam was performed according to the departmental dose-optimization program which includes automated exposure control, adjustment of the mA and/or kV according to patient size and/or use of iterative reconstruction technique. COMPARISON:  None available. FINDINGS: Alignment: No traumatic subluxation. Grade 1 anterolisthesis of C3 on C4 and C7 on T1 and retrolisthesis of C5 on C6 is likely degenerative. Skull base and vertebrae: No evidence of acute fracture. The dens and skull base are intact. Soft tissues and spinal canal: No prevertebral fluid or swelling. No visible canal hematoma. Disc levels: Advanced degenerative disc disease with near complete disc space loss from C3-C4 through  C5-C6. Advanced multilevel facet hypertrophy. Upper chest: Assessed completely on concurrent chest CT, reported separately. No apical pneumothorax. Other: None. IMPRESSION: 1. No acute fracture or traumatic subluxation of the cervical spine. 2. Advanced multilevel degenerative disc disease and facet hypertrophy. Electronically Signed   By: Andrea Gasman M.D.   On: 07/23/2024 15:27   CT Head Wo Contrast Result Date: 07/23/2024 CLINICAL DATA:  89 year old post unwitnessed fall.  Found on floor. EXAM: CT HEAD WITHOUT CONTRAST TECHNIQUE: Contiguous axial images were obtained from the  base of the skull through the vertex without intravenous contrast. RADIATION DOSE REDUCTION: This exam was performed according to the departmental dose-optimization program which includes automated exposure control, adjustment of the mA and/or kV according to patient size and/or use of iterative reconstruction technique. COMPARISON:  None Available. FINDINGS: Brain: No intracranial hemorrhage, mass effect, or midline shift. Age related atrophy. No hydrocephalus. The basilar cisterns are patent. Moderate periventricular and deep white matter hypodensity typical of chronic small vessel ischemia. Punctate remote lacunar infarct in the left basal ganglia. No evidence of territorial infarct or acute ischemia. No extra-axial or intracranial fluid collection. Vascular: Atherosclerosis of skullbase vasculature without hyperdense vessel or abnormal calcification. Skull: No fracture or focal lesion. Sinuses/Orbits: Complete opacification of left mastoid air cells. Mucous retention cyst in the right maxillary sinus. Bilateral cataract resection. Other: No confluent scalp hematoma. IMPRESSION: 1. No acute intracranial abnormality. No skull fracture. 2. Age related atrophy and chronic small vessel ischemia. 3. Opacification of left mastoid air cells. Electronically Signed   By: Andrea Gasman M.D.   On: 07/23/2024 15:21   CT ABDOMEN PELVIS W  CONTRAST Result Date: 07/23/2024 CLINICAL DATA:  Acute abdominal pain, fall. EXAM: CT ABDOMEN AND PELVIS WITH CONTRAST TECHNIQUE: Multidetector CT imaging of the abdomen and pelvis was performed using the standard protocol following bolus administration of intravenous contrast. RADIATION DOSE REDUCTION: This exam was performed according to the departmental dose-optimization program which includes automated exposure control, adjustment of the mA and/or kV according to patient size and/or use of iterative reconstruction technique. CONTRAST:  75mL OMNIPAQUE  IOHEXOL  350 MG/ML SOLN COMPARISON:  None Available. FINDINGS: Lower chest: No acute abnormality. Hepatobiliary: There is a subcentimeter hypodensity in the left lobe of the liver which is too small to characterize, likely a cyst. Otherwise, the liver, gallbladder and bile ducts are within normal limits. Pancreas: Unremarkable. No pancreatic ductal dilatation or surrounding inflammatory changes. Spleen: Normal in size without focal abnormality. Adrenals/Urinary Tract: There is a 15 mm cyst in the left kidney. Otherwise, the kidneys, adrenal glands and bladder are within normal limits. Stomach/Bowel: No evidence of bowel wall thickening, distention, or inflammatory changes. The appendix is not seen. There are scattered colonic diverticula. A small hiatal hernia is present. Stomach is otherwise within normal limits. Vascular/Lymphatic: Aortic atherosclerosis. No enlarged abdominal or pelvic lymph nodes. Reproductive: Uterus and bilateral adnexa are unremarkable. Other: No abdominal wall hernia or abnormality. No abdominopelvic ascites. Musculoskeletal: Degenerative changes affect the spine. There is scoliosis of the thoracolumbar spine. There also degenerative changes of the right sacroiliac joint. There are healed/healing right superior and inferior pubic rami fractures. There is an acute comminuted left femoral intratrochanteric fracture. IMPRESSION: 1. Acute  comminuted left femoral intratrochanteric fracture. 2. Healed/healing right superior and inferior pubic rami fractures. 3. No acute localizing process in the abdomen or pelvis. 4. Colonic diverticulosis. 5. Small hiatal hernia. Aortic Atherosclerosis (ICD10-I70.0). Electronically Signed   By: Greig Pique M.D.   On: 07/23/2024 15:16   DG Chest Portable 1 View Result Date: 07/23/2024 EXAM: 1 VIEW XRAY OF THE CHEST 07/23/2024 11:48:26 AM COMPARISON: None available. CLINICAL HISTORY: Patient with history of dementia and hypoxia, brought in by EMS after an unwitnessed fall at home. Found on floor by bed soaked in urine, with no known blood thinners and no loss of consciousness reported. Patient complains of left hip/leg pain and EMS reports some shortening of left leg. FINDINGS: LUNGS AND PLEURA: Lung volumes are low. Left basilar airspace disease may represent atelectasis, aspiration, or  infection. No pleural effusion. No pneumothorax. HEART AND MEDIASTINUM: No acute abnormality of the cardiac and mediastinal silhouettes. BONES AND SOFT TISSUES: Asymmetric advanced degenerative changes are present in the left shoulder. No acute osseous abnormality. IMPRESSION: 1. Left basilar airspace disease, possibly representing atelectasis, aspiration, or infection. 2. Asymmetric advanced degenerative changes in the left shoulder. Electronically signed by: Lonni Necessary MD 07/23/2024 11:58 AM EDT RP Workstation: [id number]   DG Hip Unilat With Pelvis 2-3 Views Left Result Date: 07/23/2024 EXAM: 2 or 3 VIEW(S) XRAY OF THE PELVIS AND LEFT HIP 07/23/2024 11:47:47 AM COMPARISON: None available. CLINICAL HISTORY: Unwitnessed fall with left hip and leg pain. Patient found on floor soaked in urine. No known blood thinners, no loss of consciousness, and some shortening of left leg reported. FINDINGS: JOINTS: SI joints are symmetric. No acute fracture. Bilateral hips demonstrate normal alignment. SOFT TISSUES: The soft tissues  are unremarkable. BONES: A comminuted intertrochanteric fracture is present in the proximal left femur. The femoral head is located. The lesser trochanter is displaced 8 mm. Callus formation about right superior and inferior pubic rami fractures suggests these are more remote and healing. Right sacral ala healing fractures are suggested. 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 Acute diastasis is considered less likely. IMPRESSION: 1. Comminuted intertrochanteric fracture in the proximal left femur with 8 mm displacement of the lesser trochanter. 2. Callus formation about right superior and inferior pubic rami fractures, suggesting these are more remote and healing. 3. Healing right sacral ala fractures are suggested. 4. 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 Acute diastasis is considered less likely. Electronically signed by: Lonni Necessary MD 07/23/2024 11:55 AM EDT RP Workstation: [id number]    Microbiology: Results for orders placed or performed during the hospital encounter of 07/23/24  Resp panel by RT-PCR (RSV, Flu A&B, Covid) Anterior Nasal Swab     Status: None   Collection Time: 07/23/24  9:53 PM   Specimen: Anterior Nasal Swab  Result Value Ref Range Status   SARS Coronavirus 2 by RT PCR NEGATIVE NEGATIVE Final    Comment: (NOTE) SARS-CoV-2 target nucleic acids are NOT DETECTED.  The SARS-CoV-2 RNA is generally detectable in upper respiratory specimens during the acute phase of infection. The lowest concentration of SARS-CoV-2 viral copies this assay can detect is 138 copies/mL. A negative result does not preclude SARS-Cov-2 infection and should not be used as the sole basis for treatment or other patient management decisions. A negative result may occur with  improper specimen collection/handling, submission of specimen other than nasopharyngeal swab, presence of viral mutation(s) within  the areas targeted by this assay, and inadequate number of viral copies(<138 copies/mL). A negative result must be combined with clinical observations, patient history, and epidemiological information. The expected result is Negative.  Fact Sheet for Patients:  BloggerCourse.com  Fact Sheet for Healthcare Providers:  SeriousBroker.it  This test is no t yet approved or cleared by the [place]  FDA and  has been authorized for detection and/or diagnosis of SARS-CoV-2 by FDA under an Emergency Use Authorization (EUA). This EUA will remain  in effect (meaning this test can be used) for the duration of the COVID-19 declaration under Section 564(b)(1) of the Act, 21 U.S.C.section 360bbb-3(b)(1), unless the authorization is terminated  or revoked sooner.       Influenza A by PCR NEGATIVE NEGATIVE Final   Influenza B by PCR NEGATIVE NEGATIVE Final    Comment: (  NOTE) The Xpert Xpress SARS-CoV-2/FLU/RSV plus assay is intended as an aid in the diagnosis of influenza from Nasopharyngeal swab specimens and should not be used as a sole basis for treatment. Nasal washings and aspirates are unacceptable for Xpert Xpress SARS-CoV-2/FLU/RSV testing.  Fact Sheet for Patients: BloggerCourse.com  Fact Sheet for Healthcare Providers: SeriousBroker.it  This test is not yet approved or cleared by the [place]  FDA and has been authorized for detection and/or diagnosis of SARS-CoV-2 by FDA under an Emergency Use Authorization (EUA). This EUA will remain in effect (meaning this test can be used) for the duration of the COVID-19 declaration under Section 564(b)(1) of the Act, 21 U.S.C. section 360bbb-3(b)(1), unless the authorization is terminated or revoked.     Resp Syncytial Virus by PCR NEGATIVE NEGATIVE Final    Comment: (NOTE) Fact Sheet for  Patients: BloggerCourse.com  Fact Sheet for Healthcare Providers: SeriousBroker.it  This test is not yet approved or cleared by the [place]  FDA and has been authorized for detection and/or diagnosis of SARS-CoV-2 by FDA under an Emergency Use Authorization (EUA). This EUA will remain in effect (meaning this test can be used) for the duration of the COVID-19 declaration under Section 564(b)(1) of the Act, 21 U.S.C. section 360bbb-3(b)(1), unless the authorization is terminated or revoked.  Performed at [facility], [address] [place], [place] [address]     Labs: CBC: Recent Labs  Lab 07/25/24 0554 07/26/24 0249 07/27/24 0622  WBC 11.3* 11.2* 10.7*  HGB 8.1* 7.2* 9.9*  HCT 25.9* 23.5* 29.7*  MCV 93.2 94.4 90.5  PLT 162 155 175   Basic Metabolic Panel: Recent Labs  Lab 07/25/24 0554 07/26/24 0249 07/27/24 0622  NA 138 136 136  K 4.5 4.6 5.1  CL 101 101 101  CO2 26 27 28   GLUCOSE 129* 109* 164*  BUN 39* 55* 50*  CREATININE 1.41* 1.51* 1.18*  CALCIUM 8.5* 8.5* 8.8*  MG 1.9  --   --    Liver Function Tests: No results for input(s): AST, ALT, ALKPHOS, BILITOT, PROT, ALBUMIN in the last 168 hours. CBG: Recent Labs  Lab 07/24/24 1120 07/24/24 1507 07/25/24 0937 07/30/24 0800 07/31/24 0810  GLUCAP 101* 126* 132* 93 97    Discharge time spent: greater than 30 minutes.  Signed: AIDA CHO, MD [facility] 07/31/2024

## 2024-07-31 NOTE — Plan of Care (Signed)

## 2024-07-31 NOTE — Plan of Care (Signed)
  Problem: Clinical Measurements: Goal: Respiratory complications will improve Outcome: Progressing   Problem: Clinical Measurements: Goal: Cardiovascular complication will be avoided Outcome: Progressing   Problem: Coping: Goal: Level of anxiety will decrease Outcome: Progressing   Problem: Safety: Goal: Ability to remain free from injury will improve Outcome: Progressing   Problem: Skin Integrity: Goal: Risk for impaired skin integrity will decrease Outcome: Progressing   

## 2024-07-31 NOTE — TOC Transition Note (Addendum)
 Transition of Care [facility]) - Discharge Note   Patient Details  Name: [name] Griffin MRN: [id number] Date of Birth: [birth year]  Transition of Care [facility]) CM/SW Contact:  Alvaro Louder, LCSW Phone Number: 07/31/2024, 11:25 AM   Clinical Narrative:    LCSWA received insurance approval for patient to admit to SNF [facility]. LCSWA confirmed with MD that patient is stable for discharge. LCSWA notified the patient and they are in agreement with discharge. LCSWA confirmed bed is available at [facility]. Transport arranged with [facility] for next available.  RM: E-5, Report number: [phone]   TOC Signing off   Final next level of care: Skilled Nursing Facility Barriers to Discharge: No Barriers Identified   Patient Goals and CMS Choice            Discharge Placement                Patient to be transferred to facility by: [facility] Name of family member notified: Delos Boning Patient and family notified of of transfer: 07/31/24  Discharge Plan and Services Additional resources added to the After Visit Summary for                                       Social Drivers of Health (SDOH) Interventions SDOH Screenings   Food Insecurity: No Food Insecurity (07/24/2024)  Housing: Low Risk  (07/24/2024)  Transportation Needs: Patient Unable To Answer (07/24/2024)  Utilities: Patient Unable To Answer (07/24/2024)  Social Connections: Unknown (07/24/2024)  Tobacco Use: Low Risk  (07/25/2024)     Readmission Risk Interventions     No data to display

## 2024-08-01 DIAGNOSIS — D62 Acute posthemorrhagic anemia: Secondary | ICD-10-CM | POA: Diagnosis not present

## 2024-08-01 DIAGNOSIS — M109 Gout, unspecified: Secondary | ICD-10-CM | POA: Diagnosis not present

## 2024-08-01 DIAGNOSIS — R5381 Other malaise: Secondary | ICD-10-CM | POA: Diagnosis not present

## 2024-08-01 DIAGNOSIS — R4189 Other symptoms and signs involving cognitive functions and awareness: Secondary | ICD-10-CM | POA: Diagnosis not present

## 2024-08-01 DIAGNOSIS — E1122 Type 2 diabetes mellitus with diabetic chronic kidney disease: Secondary | ICD-10-CM | POA: Diagnosis not present

## 2024-08-01 DIAGNOSIS — I700 Atherosclerosis of aorta: Secondary | ICD-10-CM | POA: Diagnosis not present

## 2024-08-01 DIAGNOSIS — I10 Essential (primary) hypertension: Secondary | ICD-10-CM | POA: Diagnosis not present

## 2024-08-01 DIAGNOSIS — E538 Deficiency of other specified B group vitamins: Secondary | ICD-10-CM | POA: Diagnosis not present

## 2024-08-01 DIAGNOSIS — N183 Chronic kidney disease, stage 3 unspecified: Secondary | ICD-10-CM | POA: Diagnosis not present

## 2024-08-17 DIAGNOSIS — M109 Gout, unspecified: Secondary | ICD-10-CM | POA: Diagnosis not present

## 2024-08-17 DIAGNOSIS — R5381 Other malaise: Secondary | ICD-10-CM | POA: Diagnosis not present

## 2024-08-17 DIAGNOSIS — E1122 Type 2 diabetes mellitus with diabetic chronic kidney disease: Secondary | ICD-10-CM | POA: Diagnosis not present

## 2024-08-17 DIAGNOSIS — D62 Acute posthemorrhagic anemia: Secondary | ICD-10-CM | POA: Diagnosis not present

## 2024-08-17 DIAGNOSIS — N183 Chronic kidney disease, stage 3 unspecified: Secondary | ICD-10-CM | POA: Diagnosis not present

## 2024-08-17 DIAGNOSIS — I700 Atherosclerosis of aorta: Secondary | ICD-10-CM | POA: Diagnosis not present

## 2024-08-17 DIAGNOSIS — I10 Essential (primary) hypertension: Secondary | ICD-10-CM | POA: Diagnosis not present

## 2024-08-17 DIAGNOSIS — S72142D Displaced intertrochanteric fracture of left femur, subsequent encounter for closed fracture with routine healing: Secondary | ICD-10-CM | POA: Diagnosis not present

## 2024-08-17 DIAGNOSIS — R4189 Other symptoms and signs involving cognitive functions and awareness: Secondary | ICD-10-CM | POA: Diagnosis not present

## 2024-08-21 ENCOUNTER — Telehealth: Payer: Self-pay | Admitting: Internal Medicine

## 2024-08-21 DIAGNOSIS — S72142D Displaced intertrochanteric fracture of left femur, subsequent encounter for closed fracture with routine healing: Secondary | ICD-10-CM | POA: Diagnosis not present

## 2024-08-21 DIAGNOSIS — E1122 Type 2 diabetes mellitus with diabetic chronic kidney disease: Secondary | ICD-10-CM | POA: Diagnosis not present

## 2024-08-21 DIAGNOSIS — D62 Acute posthemorrhagic anemia: Secondary | ICD-10-CM | POA: Diagnosis not present

## 2024-08-21 DIAGNOSIS — M1030 Gout due to renal impairment, unspecified site: Secondary | ICD-10-CM | POA: Diagnosis not present

## 2024-08-21 DIAGNOSIS — I700 Atherosclerosis of aorta: Secondary | ICD-10-CM | POA: Diagnosis not present

## 2024-08-21 DIAGNOSIS — E785 Hyperlipidemia, unspecified: Secondary | ICD-10-CM | POA: Diagnosis not present

## 2024-08-21 DIAGNOSIS — N1831 Chronic kidney disease, stage 3a: Secondary | ICD-10-CM | POA: Diagnosis not present

## 2024-08-21 DIAGNOSIS — I071 Rheumatic tricuspid insufficiency: Secondary | ICD-10-CM | POA: Diagnosis not present

## 2024-08-21 NOTE — Telephone Encounter (Signed)
 Patient's son called in stating that rehab only took out half of her staples. Needs the other half removed.   PT with [facility] is in the home now and said they can have a nurse come out and remove the staples if okay with NK.  Is this okay with you?   PT Soni with [facility] [phone]

## 2024-08-22 ENCOUNTER — Telehealth: Payer: Self-pay | Admitting: Internal Medicine

## 2024-08-22 DIAGNOSIS — I700 Atherosclerosis of aorta: Secondary | ICD-10-CM | POA: Diagnosis not present

## 2024-08-22 DIAGNOSIS — I10 Essential (primary) hypertension: Secondary | ICD-10-CM | POA: Diagnosis not present

## 2024-08-22 DIAGNOSIS — M1030 Gout due to renal impairment, unspecified site: Secondary | ICD-10-CM | POA: Diagnosis not present

## 2024-08-22 DIAGNOSIS — E1122 Type 2 diabetes mellitus with diabetic chronic kidney disease: Secondary | ICD-10-CM | POA: Diagnosis not present

## 2024-08-22 DIAGNOSIS — I071 Rheumatic tricuspid insufficiency: Secondary | ICD-10-CM | POA: Diagnosis not present

## 2024-08-22 DIAGNOSIS — E785 Hyperlipidemia, unspecified: Secondary | ICD-10-CM | POA: Diagnosis not present

## 2024-08-22 DIAGNOSIS — S72142D Displaced intertrochanteric fracture of left femur, subsequent encounter for closed fracture with routine healing: Secondary | ICD-10-CM | POA: Diagnosis not present

## 2024-08-22 DIAGNOSIS — N1831 Chronic kidney disease, stage 3a: Secondary | ICD-10-CM | POA: Diagnosis not present

## 2024-08-22 DIAGNOSIS — D62 Acute posthemorrhagic anemia: Secondary | ICD-10-CM | POA: Diagnosis not present

## 2024-08-22 NOTE — Telephone Encounter (Signed)
 Called requesting verbal orders for OT 1w1, skip a week, then 1w5.  Verbal orders given to [facility].

## 2024-08-25 ENCOUNTER — Telehealth: Payer: Self-pay | Admitting: Internal Medicine

## 2024-08-25 DIAGNOSIS — I10 Essential (primary) hypertension: Secondary | ICD-10-CM | POA: Diagnosis not present

## 2024-08-25 DIAGNOSIS — S72142D Displaced intertrochanteric fracture of left femur, subsequent encounter for closed fracture with routine healing: Secondary | ICD-10-CM | POA: Diagnosis not present

## 2024-08-25 DIAGNOSIS — N1831 Chronic kidney disease, stage 3a: Secondary | ICD-10-CM | POA: Diagnosis not present

## 2024-08-25 DIAGNOSIS — E785 Hyperlipidemia, unspecified: Secondary | ICD-10-CM | POA: Diagnosis not present

## 2024-08-25 DIAGNOSIS — M1030 Gout due to renal impairment, unspecified site: Secondary | ICD-10-CM | POA: Diagnosis not present

## 2024-08-25 DIAGNOSIS — D62 Acute posthemorrhagic anemia: Secondary | ICD-10-CM | POA: Diagnosis not present

## 2024-08-25 DIAGNOSIS — I700 Atherosclerosis of aorta: Secondary | ICD-10-CM | POA: Diagnosis not present

## 2024-08-25 DIAGNOSIS — E1122 Type 2 diabetes mellitus with diabetic chronic kidney disease: Secondary | ICD-10-CM | POA: Diagnosis not present

## 2024-08-25 DIAGNOSIS — I071 Rheumatic tricuspid insufficiency: Secondary | ICD-10-CM | POA: Diagnosis not present

## 2024-08-25 NOTE — Telephone Encounter (Signed)
 Called to report that today she removed 12 staples from left hip, states that it looks like it is healing well. Advised patient to keep the area dry and clean.

## 2024-08-28 ENCOUNTER — Encounter: Payer: Self-pay | Admitting: Internal Medicine

## 2024-08-28 ENCOUNTER — Ambulatory Visit (INDEPENDENT_AMBULATORY_CARE_PROVIDER_SITE_OTHER): Admitting: Internal Medicine

## 2024-08-28 VITALS — BP 116/60 | HR 68 | Ht 60.0 in | Wt 170.0 lb

## 2024-08-28 DIAGNOSIS — E1122 Type 2 diabetes mellitus with diabetic chronic kidney disease: Secondary | ICD-10-CM

## 2024-08-28 DIAGNOSIS — E782 Mixed hyperlipidemia: Secondary | ICD-10-CM

## 2024-08-28 DIAGNOSIS — N1831 Chronic kidney disease, stage 3a: Secondary | ICD-10-CM

## 2024-08-28 DIAGNOSIS — R413 Other amnesia: Secondary | ICD-10-CM | POA: Diagnosis not present

## 2024-08-28 DIAGNOSIS — Z9181 History of falling: Secondary | ICD-10-CM | POA: Insufficient documentation

## 2024-08-28 DIAGNOSIS — I129 Hypertensive chronic kidney disease with stage 1 through stage 4 chronic kidney disease, or unspecified chronic kidney disease: Secondary | ICD-10-CM | POA: Diagnosis not present

## 2024-08-28 DIAGNOSIS — E1169 Type 2 diabetes mellitus with other specified complication: Secondary | ICD-10-CM | POA: Diagnosis not present

## 2024-08-28 DIAGNOSIS — N183 Chronic kidney disease, stage 3 unspecified: Secondary | ICD-10-CM | POA: Diagnosis not present

## 2024-08-28 DIAGNOSIS — E119 Type 2 diabetes mellitus without complications: Secondary | ICD-10-CM | POA: Diagnosis not present

## 2024-08-28 NOTE — Progress Notes (Signed)
 Established Patient Office Visit  Subjective:  Patient ID: [name] Griffin, female    DOB: July 21, 1928  Age: 89 y.o. MRN: [id number]  Chief Complaint  Patient presents with   Follow-up    Follow up    Patient is here today accompanied with her son and daughter in law. She was recently hospitalized on 8/10 for left femur fx with surgical repair due to fall and was discharged home on 07/31/24. Son had reported some syncope like episodes that he described her as starring off prior too; he states that has all resolved since her femur repair and thinks it was related to her uncontrolled pain at the time. She is to FU with orhtopedic surgeon on 08/30/24. PT/OT started 1 week ago and is order for 8 weeks of in home therapy services. Since her fall her son states they have moved in with her to take care of her; she was previously living independent. She is in a wheelchair today as she is still having some difficulty with walking; but can get around her home as needed with a walker and grab bars as long as she has assistance.  Patient endorses they don't believe the Aricpet dose increase has made a huge change in patients memory loss. 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 Patient is happy and in good spirits today. She denies any complaints today and endorses that she feels great without any complaints.   Encouraged family to get her flu and covid vaccines for patient.    No other concerns at this time.   Past Medical History:  Diagnosis Date   CKD (chronic kidney disease)    Diabetes mellitus without complication (HCC)    Essential hypertension, benign    Mixed hyperlipidemia     Past Surgical History:  Procedure Laterality Date   ORIF HIP FRACTURE Left 07/24/2024   Procedure: OPEN REDUCTION INTERNAL FIXATION HIP;  Surgeon: Maryrose Cordella Charleston, MD;  Location: [facility] ORS;  Service: Orthopedics;  Laterality: Left;  Fracture table, C arm, Synthes TFN-A     Social History   Socioeconomic History   Marital status: Widowed    Spouse name: Not on file   Number of children: Not on file   Years of education: Not on file   Highest education level: Not on file  Occupational History   Not on file  Tobacco Use   Smoking status: Never   Smokeless tobacco: Never  Substance and Sexual Activity   Alcohol use: Not Currently   Drug use: Never   Sexual activity: Not on file  Other Topics Concern   Not on file  Social History Narrative   Not on file   Social Drivers of Health   Financial Resource Strain: Not on file  Food Insecurity: No Food Insecurity (07/24/2024)   Hunger Vital Sign    Worried About Running Out of Food in the Last Year: Never true    Ran Out of Food in the Last Year: Never true  Transportation Needs: Patient Unable To Answer (07/24/2024)   PRAPARE - Transportation    Lack of Transportation (Medical): Patient unable to answer    Lack of Transportation (Non-Medical): Patient unable to answer  Physical Activity: Not on file  Stress: Not on file  Social Connections: Unknown (07/24/2024)   Social Connection and Isolation Panel    Frequency of Communication with Friends and Family: Patient unable to answer    Frequency of Social Gatherings  with Friends and Family: Patient unable to answer    Attends Religious Services: Not on file    Active Member of Clubs or Organizations: Not on file    Attends Club or Organization Meetings: Not on file    Marital Status: Not on file  Intimate Partner Violence: Patient Unable To Answer (07/24/2024)   Humiliation, Afraid, Rape, and Kick questionnaire    Fear of Current or Ex-Partner: Patient unable to answer    Emotionally Abused: Patient unable to answer    Physically Abused: Patient unable to answer    Sexually Abused: Patient unable to answer    Family History  Problem Relation Age of Onset   Diabetes Mother     No Known Allergies  Outpatient Medications Prior to Visit   Medication Sig   acetaminophen  (TYLENOL ) 325 MG tablet Take 2 tablets (650 mg total) by mouth every 6 (six) hours as needed for mild pain (pain score 1-3) or fever.   allopurinol  (ZYLOPRIM ) 100 MG tablet TAKE 1 TABLET BY MOUTH EVERY DAY   aspirin  EC 325 MG tablet Take 1 tablet (325 mg total) by mouth daily.   atenolol  (TENORMIN ) 25 MG tablet TAKE 1 TABLET BY MOUTH EVERY DAY   cyanocobalamin  1000 MCG tablet Take 1 tablet (1,000 mcg total) by mouth daily.   donepezil  (ARICEPT ) 10 MG tablet Take 1 tablet (10 mg total) by mouth at bedtime.   furosemide  (LASIX ) 20 MG tablet TAKE 1 TABLET BY MOUTH EVERY DAY   lisinopril  (ZESTRIL ) 5 MG tablet TAKE 1 TABLET BY MOUTH EVERY DAY   senna-docusate (SENOKOT-S) 8.6-50 MG tablet Take 1 tablet by mouth at bedtime as needed for mild constipation.   No facility-administered medications prior to visit.    Review of Systems  Constitutional: Negative.  Negative for chills, fever and malaise/fatigue.  HENT:  Positive for hearing loss. Negative for congestion and sore throat.   Eyes: Negative.  Negative for blurred vision and pain.  Respiratory: Negative.  Negative for cough and shortness of breath.   Cardiovascular: Negative.  Negative for chest pain, palpitations and leg swelling.  Gastrointestinal: Negative.  Negative for abdominal pain, blood in stool, constipation, diarrhea, heartburn, melena, nausea and vomiting.  Genitourinary: Negative.  Negative for dysuria, flank pain, frequency and urgency.  Musculoskeletal: Negative.  Negative for joint pain and myalgias.  Skin: Negative.   Neurological: Negative.  Negative for dizziness, tingling, sensory change, weakness and headaches.  Endo/Heme/Allergies: Negative.   Psychiatric/Behavioral:  Positive for memory loss. Negative for depression and suicidal ideas. The patient is not nervous/anxious.        Objective:   BP 116/60   Pulse 68   Ht 5' (1.524 m)   Wt 170 lb (77.1 kg)   SpO2 94%   BMI 33.20  kg/m   Vitals:   08/28/24 1400  BP: 116/60  Pulse: 68  Height: 5' (1.524 m)  Weight: 170 lb (77.1 kg)  SpO2: 94%  BMI (Calculated): 33.2    Physical Exam Vitals and nursing note reviewed.  Constitutional:      Appearance: Normal appearance.  HENT:     Head: Normocephalic and atraumatic.     Nose: Nose normal.     Mouth/Throat:     Mouth: Mucous membranes are moist.     Pharynx: Oropharynx is clear.  Eyes:     Conjunctiva/sclera: Conjunctivae normal.     Pupils: Pupils are equal, round, and reactive to light.  Cardiovascular:     Rate and Rhythm: Normal  rate and regular rhythm.     Pulses: Normal pulses.     Heart sounds: Normal heart sounds. No murmur heard. Pulmonary:     Effort: Pulmonary effort is normal.     Breath sounds: Normal breath sounds. No wheezing.  Abdominal:     General: Bowel sounds are normal.     Palpations: Abdomen is soft.     Tenderness: There is no abdominal tenderness. There is no right CVA tenderness or left CVA tenderness.  Musculoskeletal:        General: Normal range of motion.     Cervical back: Normal range of motion.     Right lower leg: Edema present.     Left lower leg: Edema present.     Comments: Patient wearing compression stockings  Skin:    General: Skin is warm and dry.  Neurological:     General: No focal deficit present.     Mental Status: She is alert and oriented to person, place, and time.  Psychiatric:        Mood and Affect: Mood normal.        Behavior: Behavior normal.      No results found for any visits on 08/28/24.  Recent Results (from the past 2160 hours)  CBC with Differential     Status: Abnormal   Collection Time: 07/23/24 11:36 AM  Result Value Ref Range   WBC 11.9 (H) 4.0 - 10.5 K/uL   RBC 4.48 3.87 - 5.11 MIL/uL   Hemoglobin 13.0 12.0 - 15.0 g/dL   HCT 58.7 63.9 - 53.9 %   MCV 92.0 80.0 - 100.0 fL   MCH 29.0 26.0 - 34.0 pg   MCHC 31.6 30.0 - 36.0 g/dL   RDW 85.8 88.4 - 84.4 %   Platelets 221  150 - 400 K/uL   nRBC 0.0 0.0 - 0.2 %   Neutrophils Relative % 87 %   Neutro Abs 10.2 (H) 1.7 - 7.7 K/uL   Lymphocytes Relative 8 %   Lymphs Abs 0.9 0.7 - 4.0 K/uL   Monocytes Relative 5 %   Monocytes Absolute 0.6 0.1 - 1.0 K/uL   Eosinophils Relative 0 %   Eosinophils Absolute 0.0 0.0 - 0.5 K/uL   Basophils Relative 0 %   Basophils Absolute 0.0 0.0 - 0.1 K/uL   Immature Granulocytes 0 %   Abs Immature Granulocytes 0.04 0.00 - 0.07 K/uL    Comment: Performed at [facility], [address], [place], [place] [address]  Brain natriuretic peptide     Status: Abnormal   Collection Time: 07/23/24 11:36 AM  Result Value Ref Range   B Natriuretic Peptide 104.8 (H) 0.0 - 100.0 pg/mL    Comment: Performed at [facility], [address] [place], [place] [address]  CK     Status: Abnormal   Collection Time: 07/23/24 12:21 PM  Result Value Ref Range   Total CK 432 (H) 38 - 234 U/L    Comment: Performed at [facility], [address] [place], [place] [address]  Comprehensive metabolic panel with GFR     Status: Abnormal   Collection Time: 07/23/24 12:21 PM  Result Value Ref Range   Sodium 136 135 - 145 mmol/L   Potassium 4.5 3.5 - 5.1 mmol/L   Chloride 97 (L) 98 - 111 mmol/L   CO2 24 22 - 32 mmol/L   Glucose, Bld 165 (H) 70 - 99 mg/dL    Comment: Glucose reference range applies  only to samples taken after fasting for at least 8 hours.   BUN 25 (H) 8 - 23 mg/dL   Creatinine, Ser 8.79 (H) 0.44 - 1.00 mg/dL   Calcium 9.5 8.9 - 89.6 mg/dL   Total Protein 7.2 6.5 - 8.1 g/dL   Albumin 3.3 (L) 3.5 - 5.0 g/dL   AST 34 15 - 41 U/L   ALT 16 0 - 44 U/L   Alkaline Phosphatase 79 38 - 126 U/L   Total Bilirubin 0.8 0.0 - 1.2 mg/dL   GFR, Estimated 42 (L) >60 mL/min    Comment: (NOTE) Calculated using the CKD-EPI Creatinine Equation (2021)    Anion gap 15 5 - 15    Comment: Performed at [facility], [address] [place], [place] [address]   Protime-INR     Status: Abnormal   Collection Time: 07/23/24 12:21 PM  Result Value Ref Range   Prothrombin Time 15.5 (H) 11.4 - 15.2 seconds   INR 1.2 0.8 - 1.2    Comment: (NOTE) INR goal varies based on device and disease states. Performed at [facility], [address] [place], [place] [address]   Troponin I (High Sensitivity)     Status: None   Collection Time: 07/23/24 12:21 PM  Result Value Ref Range   Troponin I (High Sensitivity) 13 <18 ng/L    Comment: (NOTE) Elevated high sensitivity troponin I (hsTnI) values and significant  changes across serial measurements may suggest ACS but many other  chronic and acute conditions are known to elevate hsTnI results.  Refer to the Links section for chest pain algorithms and additional  guidance. Performed at [facility], [address] [place], [place] [address]   CBG monitoring, ED     Status: Abnormal   Collection Time: 07/23/24  6:45 PM  Result Value Ref Range   Glucose-Capillary 100 (H) 70 - 99 mg/dL    Comment: Glucose reference range applies only to samples taken after fasting for at least 8 hours.  Type and screen [facility]     Status: None   Collection Time: 07/23/24  8:31 PM  Result Value Ref Range   ABO/RH(D) O POS    Antibody Screen NEG    Sample Expiration 07/26/2024,2359    Unit Number [id number]    Blood Component Type RBC LR PHER2    Unit division 00    Status of Unit ISSUED,FINAL    Transfusion Status OK TO TRANSFUSE    Crossmatch Result      Compatible Performed at [facility], [address] [place], [place] [address]   APTT     Status: None   Collection Time: 07/23/24  8:31 PM  Result Value Ref Range   aPTT 29 24 - 36 seconds    Comment: Performed at [facility], [address] [place], [place] [address]  BPAM RBC     Status: None   Collection Time: 07/23/24  8:31 PM  Result Value Ref Range   ISSUE DATE / TIME  [id number]    Blood Product Unit Number [id number]    PRODUCT CODE [id number]    Unit Type and Rh 5100    Blood Product Expiration Date 797491697640   Resp panel by RT-PCR (RSV, Flu A&B, Covid) Anterior Nasal Swab     Status: None   Collection Time: 07/23/24  9:53 PM   Specimen: Anterior Nasal Swab  Result Value Ref Range   SARS Coronavirus 2 by RT  PCR NEGATIVE NEGATIVE    Comment: (NOTE) SARS-CoV-2 target nucleic acids are NOT DETECTED.  The SARS-CoV-2 RNA is generally detectable in upper respiratory specimens during the acute phase of infection. The lowest concentration of SARS-CoV-2 viral copies this assay can detect is 138 copies/mL. A negative result does not preclude SARS-Cov-2 infection and should not be used as the sole basis for treatment or other patient management decisions. A negative result may occur with  improper specimen collection/handling, submission of specimen other than nasopharyngeal swab, presence of viral mutation(s) within the areas targeted by this assay, and inadequate number of viral copies(<138 copies/mL). A negative result must be combined with clinical observations, patient history, and epidemiological information. The expected result is Negative.  Fact Sheet for Patients:  BloggerCourse.com  Fact Sheet for Healthcare Providers:  SeriousBroker.it  This test is no t yet approved or cleared by the [place]  FDA and  has been authorized for detection and/or diagnosis of SARS-CoV-2 by FDA under an Emergency Use Authorization (EUA). This EUA will remain  in effect (meaning this test can be used) for the duration of the COVID-19 declaration under Section 564(b)(1) of the Act, 21 U.S.C.section 360bbb-3(b)(1), unless the authorization is terminated  or revoked sooner.       Influenza A by PCR NEGATIVE NEGATIVE   Influenza B by PCR NEGATIVE NEGATIVE    Comment: (NOTE) The Xpert Xpress  SARS-CoV-2/FLU/RSV plus assay is intended as an aid in the diagnosis of influenza from Nasopharyngeal swab specimens and should not be used as a sole basis for treatment. Nasal washings and aspirates are unacceptable for Xpert Xpress SARS-CoV-2/FLU/RSV testing.  Fact Sheet for Patients: BloggerCourse.com  Fact Sheet for Healthcare Providers: SeriousBroker.it  This test is not yet approved or cleared by the [place]  FDA and has been authorized for detection and/or diagnosis of SARS-CoV-2 by FDA under an Emergency Use Authorization (EUA). This EUA will remain in effect (meaning this test can be used) for the duration of the COVID-19 declaration under Section 564(b)(1) of the Act, 21 U.S.C. section 360bbb-3(b)(1), unless the authorization is terminated or revoked.     Resp Syncytial Virus by PCR NEGATIVE NEGATIVE    Comment: (NOTE) Fact Sheet for Patients: BloggerCourse.com  Fact Sheet for Healthcare Providers: SeriousBroker.it  This test is not yet approved or cleared by the [place]  FDA and has been authorized for detection and/or diagnosis of SARS-CoV-2 by FDA under an Emergency Use Authorization (EUA). This EUA will remain in effect (meaning this test can be used) for the duration of the COVID-19 declaration under Section 564(b)(1) of the Act, 21 U.S.C. section 360bbb-3(b)(1), unless the authorization is terminated or revoked.  Performed at [facility], [address] [place], [place] [address]   Procalcitonin     Status: None   Collection Time: 07/23/24 11:12 PM  Result Value Ref Range   Procalcitonin <0.10 ng/mL    Comment:        Interpretation: PCT (Procalcitonin) <= 0.5 ng/mL: Systemic infection (sepsis) is not likely. Local bacterial infection is possible. (NOTE)       Sepsis PCT Algorithm           Lower Respiratory Tract                                       Infection PCT Algorithm    ----------------------------     ----------------------------  PCT < 0.25 ng/mL                PCT < 0.10 ng/mL          Strongly encourage             Strongly discourage   discontinuation of antibiotics    initiation of antibiotics    ----------------------------     -----------------------------       PCT 0.25 - 0.50 ng/mL            PCT 0.10 - 0.25 ng/mL               OR       >80% decrease in PCT            Discourage initiation of                                            antibiotics      Encourage discontinuation           of antibiotics    ----------------------------     -----------------------------         PCT >= 0.50 ng/mL              PCT 0.26 - 0.50 ng/mL               AND        <80% decrease in PCT             Encourage initiation of                                             antibiotics       Encourage continuation           of antibiotics    ----------------------------     -----------------------------        PCT >= 0.50 ng/mL                  PCT > 0.50 ng/mL               AND         increase in PCT                  Strongly encourage                                      initiation of antibiotics    Strongly encourage escalation           of antibiotics                                     -----------------------------                                           PCT <= 0.25 ng/mL  OR                                        > 80% decrease in PCT                                      Discontinue / Do not initiate                                             antibiotics  Performed at [facility] [address] [place], [place] [address]   Vitamin B12     Status: None   Collection Time: 07/23/24 11:12 PM  Result Value Ref Range   Vitamin B-12 219 180 - 914 pg/mL    Comment: (NOTE) This assay is not validated for testing neonatal or myeloproliferative  syndrome specimens for Vitamin B12 levels. Performed at [facility], [address], [place], [place] [address]   CBC     Status: Abnormal   Collection Time: 07/24/24  5:06 AM  Result Value Ref Range   WBC 8.1 4.0 - 10.5 K/uL   RBC 3.48 (L) 3.87 - 5.11 MIL/uL   Hemoglobin 10.2 (L) 12.0 - 15.0 g/dL   HCT 67.9 (L) 63.9 - 53.9 %   MCV 92.0 80.0 - 100.0 fL   MCH 29.3 26.0 - 34.0 pg   MCHC 31.9 30.0 - 36.0 g/dL   RDW 85.3 88.4 - 84.4 %   Platelets 169 150 - 400 K/uL   nRBC 0.0 0.0 - 0.2 %    Comment: Performed at [facility], [address], [place], [place] [address]  Basic metabolic panel     Status: Abnormal   Collection Time: 07/24/24  5:06 AM  Result Value Ref Range   Sodium 138 135 - 145 mmol/L   Potassium 4.5 3.5 - 5.1 mmol/L   Chloride 104 98 - 111 mmol/L   CO2 27 22 - 32 mmol/L   Glucose, Bld 119 (H) 70 - 99 mg/dL    Comment: Glucose reference range applies only to samples taken after fasting for at least 8 hours.   BUN 30 (H) 8 - 23 mg/dL   Creatinine, Ser 8.75 (H) 0.44 - 1.00 mg/dL   Calcium 8.7 (L) 8.9 - 10.3 mg/dL   GFR, Estimated 41 (L) >60 mL/min    Comment: (NOTE) Calculated using the CKD-EPI Creatinine Equation (2021)    Anion gap 7 5 - 15    Comment: Performed at [facility], [address] [place], [place] [address]  Glucose, capillary     Status: Abnormal   Collection Time: 07/24/24  7:38 AM  Result Value Ref Range   Glucose-Capillary 125 (H) 70 - 99 mg/dL    Comment: Glucose reference range applies only to samples taken after fasting for at least 8 hours.   Comment 1 Notify RN   Glucose, capillary     Status: Abnormal   Collection Time: 07/24/24 11:20 AM  Result Value Ref Range   Glucose-Capillary 101 (H) 70 - 99 mg/dL    Comment: Glucose reference range applies only to samples taken after fasting for at least 8 hours.  Glucose, capillary  Status: Abnormal   Collection Time: 07/24/24  3:07 PM  Result Value Ref Range    Glucose-Capillary 126 (H) 70 - 99 mg/dL    Comment: Glucose reference range applies only to samples taken after fasting for at least 8 hours.   Comment 1 Notify RN    Comment 2 Document in Chart   CBC     Status: Abnormal   Collection Time: 07/25/24  5:54 AM  Result Value Ref Range   WBC 11.3 (H) 4.0 - 10.5 K/uL   RBC 2.78 (L) 3.87 - 5.11 MIL/uL   Hemoglobin 8.1 (L) 12.0 - 15.0 g/dL   HCT 74.0 (L) 63.9 - 53.9 %   MCV 93.2 80.0 - 100.0 fL   MCH 29.1 26.0 - 34.0 pg   MCHC 31.3 30.0 - 36.0 g/dL   RDW 85.6 88.4 - 84.4 %   Platelets 162 150 - 400 K/uL   nRBC 0.0 0.0 - 0.2 %    Comment: Performed at [facility], [address] [place], [place] [address]  Basic metabolic panel with GFR     Status: Abnormal   Collection Time: 07/25/24  5:54 AM  Result Value Ref Range   Sodium 138 135 - 145 mmol/L   Potassium 4.5 3.5 - 5.1 mmol/L   Chloride 101 98 - 111 mmol/L   CO2 26 22 - 32 mmol/L   Glucose, Bld 129 (H) 70 - 99 mg/dL    Comment: Glucose reference range applies only to samples taken after fasting for at least 8 hours.   BUN 39 (H) 8 - 23 mg/dL   Creatinine, Ser 8.58 (H) 0.44 - 1.00 mg/dL   Calcium 8.5 (L) 8.9 - 10.3 mg/dL   GFR, Estimated 35 (L) >60 mL/min    Comment: (NOTE) Calculated using the CKD-EPI Creatinine Equation (2021)    Anion gap 11 5 - 15    Comment: Performed at [facility], [address], [place], [place] [address]  Magnesium     Status: None   Collection Time: 07/25/24  5:54 AM  Result Value Ref Range   Magnesium 1.9 1.7 - 2.4 mg/dL    Comment: Performed at [facility], [address] [place], [place] [address]  Ferritin     Status: None   Collection Time: 07/25/24  5:54 AM  Result Value Ref Range   Ferritin 144 11 - 307 ng/mL    Comment: Performed at [facility], [address] [place], [place] [address]  Glucose, capillary     Status: Abnormal   Collection Time: 07/25/24  9:37 AM  Result Value Ref Range    Glucose-Capillary 132 (H) 70 - 99 mg/dL    Comment: Glucose reference range applies only to samples taken after fasting for at least 8 hours.  ECHOCARDIOGRAM COMPLETE     Status: None   Collection Time: 07/25/24  5:37 PM  Result Value Ref Range   Weight 2,758.4 oz   Height 63 in   BP 102/42 mmHg   Ao pk vel 1.83 m/s   AR max vel 2.15 cm2   AV Peak grad 13.4 mmHg   S' Lateral 2.20 cm   Area-P 1/2 3.17 cm2   Est EF 60 - 65%   CBC     Status: Abnormal   Collection Time: 07/26/24  2:49 AM  Result Value Ref Range   WBC 11.2 (H) 4.0 - 10.5 K/uL   RBC 2.49 (L) 3.87 - 5.11 MIL/uL   Hemoglobin  7.2 (L) 12.0 - 15.0 g/dL   HCT 76.4 (L) 63.9 - 53.9 %   MCV 94.4 80.0 - 100.0 fL   MCH 28.9 26.0 - 34.0 pg   MCHC 30.6 30.0 - 36.0 g/dL   RDW 85.4 88.4 - 84.4 %   Platelets 155 150 - 400 K/uL   nRBC 0.0 0.0 - 0.2 %    Comment: Performed at [facility], [address] [place], [place] [address]  Basic metabolic panel with GFR     Status: Abnormal   Collection Time: 07/26/24  2:49 AM  Result Value Ref Range   Sodium 136 135 - 145 mmol/L   Potassium 4.6 3.5 - 5.1 mmol/L   Chloride 101 98 - 111 mmol/L   CO2 27 22 - 32 mmol/L   Glucose, Bld 109 (H) 70 - 99 mg/dL    Comment: Glucose reference range applies only to samples taken after fasting for at least 8 hours.   BUN 55 (H) 8 - 23 mg/dL   Creatinine, Ser 8.48 (H) 0.44 - 1.00 mg/dL   Calcium 8.5 (L) 8.9 - 10.3 mg/dL   GFR, Estimated 32 (L) >60 mL/min    Comment: (NOTE) Calculated using the CKD-EPI Creatinine Equation (2021)    Anion gap 8 5 - 15    Comment: Performed at [facility], [address] [place], [place] [address]  ABO/Rh     Status: None   Collection Time: 07/26/24  2:49 AM  Result Value Ref Range   ABO/RH(D)      O POS Performed at [facility], [address] [place], [place] [address]   Prepare RBC (crossmatch)     Status: None   Collection Time: 07/26/24  9:00 AM  Result Value Ref Range    Order Confirmation      ORDER PROCESSED BY BLOOD BANK Performed at [facility], [address] [place], [place] [address]   Prepare RBC (crossmatch)     Status: None   Collection Time: 07/26/24 11:05 AM  Result Value Ref Range   Order Confirmation      DUPLICATE REQUEST BB SAMPLE OR UNITS ALREADY AVAILABLE Performed at [facility], [address] [place], [place] [address]   CBC     Status: Abnormal   Collection Time: 07/27/24  6:22 AM  Result Value Ref Range   WBC 10.7 (H) 4.0 - 10.5 K/uL   RBC 3.28 (L) 3.87 - 5.11 MIL/uL   Hemoglobin 9.9 (L) 12.0 - 15.0 g/dL    Comment: REPEATED TO VERIFY   HCT 29.7 (L) 36.0 - 46.0 %   MCV 90.5 80.0 - 100.0 fL   MCH 30.2 26.0 - 34.0 pg   MCHC 33.3 30.0 - 36.0 g/dL   RDW 85.6 88.4 - 84.4 %   Platelets 175 150 - 400 K/uL   nRBC 0.0 0.0 - 0.2 %    Comment: Performed at [facility], [address] [place], [place] [address]  Basic metabolic panel     Status: Abnormal   Collection Time: 07/27/24  6:22 AM  Result Value Ref Range   Sodium 136 135 - 145 mmol/L   Potassium 5.1 3.5 - 5.1 mmol/L   Chloride 101 98 - 111 mmol/L   CO2 28 22 - 32 mmol/L   Glucose, Bld 164 (H) 70 - 99 mg/dL    Comment: Glucose reference range applies only to samples taken after fasting for at least 8 hours.   BUN  50 (H) 8 - 23 mg/dL   Creatinine, Ser 8.81 (H) 0.44 - 1.00 mg/dL   Calcium 8.8 (L) 8.9 - 10.3 mg/dL   GFR, Estimated 43 (L) >60 mL/min    Comment: (NOTE) Calculated using the CKD-EPI Creatinine Equation (2021)    Anion gap 7 5 - 15    Comment: Performed at [facility], [address] [place], [place] [address]  Glucose, capillary     Status: None   Collection Time: 07/30/24  8:00 AM  Result Value Ref Range   Glucose-Capillary 93 70 - 99 mg/dL    Comment: Glucose reference range applies only to samples taken after fasting for at least 8 hours.  Glucose, capillary     Status: None   Collection Time:  07/31/24  8:10 AM  Result Value Ref Range   Glucose-Capillary 97 70 - 99 mg/dL    Comment: Glucose reference range applies only to samples taken after fasting for at least 8 hours.      Assessment & Plan:  Continue taking medications as prescribed. Will check routine labs today. Encouraged family to continue PT/OT services and keep orthopedic FU as recommended.  Problem List Items Addressed This Visit     Type 2 diabetes mellitus without complication, without long-term current use of insulin (HCC) - Primary   Relevant Orders   Hemoglobin A1c   Memory impairment   Chronic kidney disease, stage 3a (HCC)   Relevant Orders   CBC with Diff   CMP14+EGFR   Combined hyperlipidemia associated with type 2 diabetes mellitus (HCC)   Relevant Orders   Lipid Panel w/o Chol/HDL Ratio   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)   Relevant Orders   CBC with Diff   CMP14+EGFR   History of recent fall    Return in about 6 months (around 02/25/2025).   Total time spent: 30 minutes  FERNAND FREDY RAMAN, MD  08/28/2024   This document may have been prepared by [facility] Voice Recognition software and as such may include unintentional dictation errors.

## 2024-08-29 ENCOUNTER — Ambulatory Visit: Payer: Self-pay | Admitting: Internal Medicine

## 2024-08-29 LAB — CMP14+EGFR
ALT: 9 IU/L (ref 0–32)
AST: 14 IU/L (ref 0–40)
Albumin: 3.2 g/dL — ABNORMAL LOW (ref 3.6–4.6)
Alkaline Phosphatase: 154 IU/L — ABNORMAL HIGH (ref 48–129)
BUN/Creatinine Ratio: 13 (ref 12–28)
BUN: 14 mg/dL (ref 10–36)
Bilirubin Total: 0.2 mg/dL (ref 0.0–1.2)
CO2: 23 mmol/L (ref 20–29)
Calcium: 9 mg/dL (ref 8.7–10.3)
Chloride: 101 mmol/L (ref 96–106)
Creatinine, Ser: 1.09 mg/dL — ABNORMAL HIGH (ref 0.57–1.00)
Globulin, Total: 2.5 g/dL (ref 1.5–4.5)
Glucose: 134 mg/dL — ABNORMAL HIGH (ref 70–99)
Potassium: 4 mmol/L (ref 3.5–5.2)
Sodium: 141 mmol/L (ref 134–144)
Total Protein: 5.7 g/dL — ABNORMAL LOW (ref 6.0–8.5)
eGFR: 47 mL/min/1.73 — ABNORMAL LOW (ref >59)

## 2024-08-29 LAB — CBC WITH DIFFERENTIAL/PLATELET
Basophils Absolute: 0 x10E3/uL (ref 0.0–0.2)
Basos: 0 %
EOS (ABSOLUTE): 0.2 x10E3/uL (ref 0.0–0.4)
Eos: 2 %
Hematocrit: 36.2 % (ref 34.0–46.6)
Hemoglobin: 11.3 g/dL (ref 11.1–15.9)
Immature Grans (Abs): 0 x10E3/uL (ref 0.0–0.1)
Immature Granulocytes: 0 %
Lymphocytes Absolute: 1.5 x10E3/uL (ref 0.7–3.1)
Lymphs: 20 %
MCH: 30.3 pg (ref 26.6–33.0)
MCHC: 31.2 g/dL — ABNORMAL LOW (ref 31.5–35.7)
MCV: 97 fL (ref 79–97)
Monocytes Absolute: 0.5 x10E3/uL (ref 0.1–0.9)
Monocytes: 7 %
Neutrophils Absolute: 5.3 x10E3/uL (ref 1.4–7.0)
Neutrophils: 71 %
Platelets: 248 x10E3/uL (ref 150–450)
RBC: 3.73 x10E6/uL — ABNORMAL LOW (ref 3.77–5.28)
RDW: 13.9 % (ref 11.7–15.4)
WBC: 7.6 x10E3/uL (ref 3.4–10.8)

## 2024-08-29 LAB — LIPID PANEL W/O CHOL/HDL RATIO
Cholesterol, Total: 152 mg/dL (ref 100–199)
HDL: 39 mg/dL — ABNORMAL LOW (ref >39)
LDL Chol Calc (NIH): 88 mg/dL (ref 0–99)
Triglycerides: 140 mg/dL (ref 0–149)
VLDL Cholesterol Cal: 25 mg/dL (ref 5–40)

## 2024-08-29 LAB — HEMOGLOBIN A1C
Est. average glucose Bld gHb Est-mCnc: 97 mg/dL
Hgb A1c MFr Bld: 5 % (ref 4.8–5.6)

## 2024-08-30 DIAGNOSIS — S72142D Displaced intertrochanteric fracture of left femur, subsequent encounter for closed fracture with routine healing: Secondary | ICD-10-CM | POA: Diagnosis not present

## 2024-09-04 DIAGNOSIS — M1030 Gout due to renal impairment, unspecified site: Secondary | ICD-10-CM | POA: Diagnosis not present

## 2024-09-04 DIAGNOSIS — I071 Rheumatic tricuspid insufficiency: Secondary | ICD-10-CM | POA: Diagnosis not present

## 2024-09-04 DIAGNOSIS — S72142D Displaced intertrochanteric fracture of left femur, subsequent encounter for closed fracture with routine healing: Secondary | ICD-10-CM | POA: Diagnosis not present

## 2024-09-04 DIAGNOSIS — I700 Atherosclerosis of aorta: Secondary | ICD-10-CM | POA: Diagnosis not present

## 2024-09-04 DIAGNOSIS — E1122 Type 2 diabetes mellitus with diabetic chronic kidney disease: Secondary | ICD-10-CM | POA: Diagnosis not present

## 2024-09-04 DIAGNOSIS — E785 Hyperlipidemia, unspecified: Secondary | ICD-10-CM | POA: Diagnosis not present

## 2024-09-04 DIAGNOSIS — I10 Essential (primary) hypertension: Secondary | ICD-10-CM | POA: Diagnosis not present

## 2024-09-04 DIAGNOSIS — N1831 Chronic kidney disease, stage 3a: Secondary | ICD-10-CM | POA: Diagnosis not present

## 2024-09-05 DIAGNOSIS — H7102 Cholesteatoma of attic, left ear: Secondary | ICD-10-CM | POA: Diagnosis not present

## 2024-09-05 DIAGNOSIS — H6123 Impacted cerumen, bilateral: Secondary | ICD-10-CM | POA: Diagnosis not present

## 2024-09-12 DIAGNOSIS — D62 Acute posthemorrhagic anemia: Secondary | ICD-10-CM | POA: Diagnosis not present

## 2024-09-12 DIAGNOSIS — S72142D Displaced intertrochanteric fracture of left femur, subsequent encounter for closed fracture with routine healing: Secondary | ICD-10-CM | POA: Diagnosis not present

## 2024-09-12 DIAGNOSIS — E785 Hyperlipidemia, unspecified: Secondary | ICD-10-CM | POA: Diagnosis not present

## 2024-09-12 DIAGNOSIS — E1122 Type 2 diabetes mellitus with diabetic chronic kidney disease: Secondary | ICD-10-CM | POA: Diagnosis not present

## 2024-09-12 DIAGNOSIS — I700 Atherosclerosis of aorta: Secondary | ICD-10-CM | POA: Diagnosis not present

## 2024-09-12 DIAGNOSIS — M1030 Gout due to renal impairment, unspecified site: Secondary | ICD-10-CM | POA: Diagnosis not present

## 2024-09-12 DIAGNOSIS — N1831 Chronic kidney disease, stage 3a: Secondary | ICD-10-CM | POA: Diagnosis not present

## 2024-09-12 DIAGNOSIS — I10 Essential (primary) hypertension: Secondary | ICD-10-CM | POA: Diagnosis not present

## 2024-09-19 ENCOUNTER — Inpatient Hospital Stay

## 2024-09-19 ENCOUNTER — Emergency Department

## 2024-09-19 ENCOUNTER — Other Ambulatory Visit: Payer: Self-pay

## 2024-09-19 ENCOUNTER — Inpatient Hospital Stay
Admission: EM | Admit: 2024-09-19 | Discharge: 2024-09-24 | DRG: 291 | Disposition: A | Discharge status: Home Health | Attending: Internal Medicine | Admitting: Internal Medicine

## 2024-09-19 DIAGNOSIS — R5383 Other fatigue: Secondary | ICD-10-CM | POA: Diagnosis not present

## 2024-09-19 DIAGNOSIS — I5033 Acute on chronic diastolic (congestive) heart failure: Secondary | ICD-10-CM | POA: Diagnosis present

## 2024-09-19 DIAGNOSIS — Z6830 Body mass index (BMI) 30.0-30.9, adult: Secondary | ICD-10-CM | POA: Diagnosis not present

## 2024-09-19 DIAGNOSIS — Z66 Do not resuscitate: Secondary | ICD-10-CM | POA: Diagnosis present

## 2024-09-19 DIAGNOSIS — J9811 Atelectasis: Secondary | ICD-10-CM | POA: Diagnosis not present

## 2024-09-19 DIAGNOSIS — E66811 Obesity, class 1: Secondary | ICD-10-CM | POA: Diagnosis present

## 2024-09-19 DIAGNOSIS — R918 Other nonspecific abnormal finding of lung field: Secondary | ICD-10-CM | POA: Diagnosis not present

## 2024-09-19 DIAGNOSIS — E1122 Type 2 diabetes mellitus with diabetic chronic kidney disease: Secondary | ICD-10-CM | POA: Diagnosis not present

## 2024-09-19 DIAGNOSIS — Z7982 Long term (current) use of aspirin: Secondary | ICD-10-CM

## 2024-09-19 DIAGNOSIS — I130 Hypertensive heart and chronic kidney disease with heart failure and stage 1 through stage 4 chronic kidney disease, or unspecified chronic kidney disease: Principal | ICD-10-CM | POA: Diagnosis present

## 2024-09-19 DIAGNOSIS — I2489 Other forms of acute ischemic heart disease: Secondary | ICD-10-CM | POA: Diagnosis not present

## 2024-09-19 DIAGNOSIS — N179 Acute kidney failure, unspecified: Secondary | ICD-10-CM | POA: Diagnosis present

## 2024-09-19 DIAGNOSIS — I5A Non-ischemic myocardial injury (non-traumatic): Secondary | ICD-10-CM | POA: Diagnosis present

## 2024-09-19 DIAGNOSIS — Z8781 Personal history of (healed) traumatic fracture: Secondary | ICD-10-CM

## 2024-09-19 DIAGNOSIS — F03B0 Unspecified dementia, moderate, without behavioral disturbance, psychotic disturbance, mood disturbance, and anxiety: Secondary | ICD-10-CM | POA: Diagnosis present

## 2024-09-19 DIAGNOSIS — Z833 Family history of diabetes mellitus: Secondary | ICD-10-CM | POA: Diagnosis not present

## 2024-09-19 DIAGNOSIS — I5032 Chronic diastolic (congestive) heart failure: Secondary | ICD-10-CM | POA: Diagnosis not present

## 2024-09-19 DIAGNOSIS — Z1152 Encounter for screening for COVID-19: Secondary | ICD-10-CM

## 2024-09-19 DIAGNOSIS — I10 Essential (primary) hypertension: Secondary | ICD-10-CM | POA: Diagnosis not present

## 2024-09-19 DIAGNOSIS — J90 Pleural effusion, not elsewhere classified: Secondary | ICD-10-CM | POA: Diagnosis not present

## 2024-09-19 DIAGNOSIS — M1000 Idiopathic gout, unspecified site: Secondary | ICD-10-CM | POA: Diagnosis present

## 2024-09-19 DIAGNOSIS — N1831 Chronic kidney disease, stage 3a: Secondary | ICD-10-CM | POA: Diagnosis present

## 2024-09-19 DIAGNOSIS — E876 Hypokalemia: Secondary | ICD-10-CM | POA: Diagnosis present

## 2024-09-19 DIAGNOSIS — H9190 Unspecified hearing loss, unspecified ear: Secondary | ICD-10-CM | POA: Diagnosis present

## 2024-09-19 DIAGNOSIS — R7989 Other specified abnormal findings of blood chemistry: Secondary | ICD-10-CM | POA: Diagnosis not present

## 2024-09-19 DIAGNOSIS — R413 Other amnesia: Secondary | ICD-10-CM | POA: Diagnosis present

## 2024-09-19 DIAGNOSIS — Z79899 Other long term (current) drug therapy: Secondary | ICD-10-CM

## 2024-09-19 DIAGNOSIS — T502X5A Adverse effect of carbonic-anhydrase inhibitors, benzothiadiazides and other diuretics, initial encounter: Secondary | ICD-10-CM | POA: Diagnosis present

## 2024-09-19 DIAGNOSIS — J9601 Acute respiratory failure with hypoxia: Principal | ICD-10-CM | POA: Diagnosis present

## 2024-09-19 DIAGNOSIS — J810 Acute pulmonary edema: Secondary | ICD-10-CM

## 2024-09-19 DIAGNOSIS — J189 Pneumonia, unspecified organism: Secondary | ICD-10-CM | POA: Diagnosis present

## 2024-09-19 DIAGNOSIS — R0902 Hypoxemia: Secondary | ICD-10-CM

## 2024-09-19 DIAGNOSIS — R0602 Shortness of breath: Secondary | ICD-10-CM | POA: Diagnosis not present

## 2024-09-19 DIAGNOSIS — R531 Weakness: Secondary | ICD-10-CM

## 2024-09-19 DIAGNOSIS — E669 Obesity, unspecified: Secondary | ICD-10-CM | POA: Diagnosis not present

## 2024-09-19 DIAGNOSIS — R600 Localized edema: Secondary | ICD-10-CM | POA: Diagnosis not present

## 2024-09-19 DIAGNOSIS — S42201A Unspecified fracture of upper end of right humerus, initial encounter for closed fracture: Secondary | ICD-10-CM | POA: Diagnosis not present

## 2024-09-19 LAB — TROPONIN I (HIGH SENSITIVITY)
Troponin I (High Sensitivity): 43 ng/L — ABNORMAL HIGH (ref <18)
Troponin I (High Sensitivity): 43 ng/L — ABNORMAL HIGH (ref <18)
Troponin I (High Sensitivity): 35 ng/L — ABNORMAL HIGH (ref <18)

## 2024-09-19 LAB — RESP PANEL BY RT-PCR (RSV, FLU A&B, COVID)  RVPGX2
Influenza A by PCR: NEGATIVE
Influenza B by PCR: NEGATIVE
Resp Syncytial Virus by PCR: NEGATIVE
SARS Coronavirus 2 by RT PCR: NEGATIVE

## 2024-09-19 LAB — BLOOD GAS, VENOUS: Patient temperature: 37

## 2024-09-19 LAB — D-DIMER, QUANTITATIVE: D-Dimer, Quant: 2.54 ug{FEU}/mL — ABNORMAL HIGH (ref 0.00–0.50)

## 2024-09-19 LAB — CBC
HCT: 38.1 % (ref 36.0–46.0)
Hemoglobin: 11.8 g/dL — ABNORMAL LOW (ref 12.0–15.0)
MCH: 29.9 pg (ref 26.0–34.0)
MCHC: 31 g/dL (ref 30.0–36.0)
MCV: 96.7 fL (ref 80.0–100.0)
Platelets: 235 K/uL (ref 150–400)
RBC: 3.94 MIL/uL (ref 3.87–5.11)
RDW: 14.6 % (ref 11.5–15.5)
WBC: 10.3 K/uL (ref 4.0–10.5)
nRBC: 0 % (ref 0.0–0.2)

## 2024-09-19 LAB — BRAIN NATRIURETIC PEPTIDE: B Natriuretic Peptide: 305.8 pg/mL — ABNORMAL HIGH (ref 0.0–100.0)

## 2024-09-19 LAB — LACTIC ACID, PLASMA: Lactic Acid, Venous: 1.2 mmol/L (ref 0.5–1.9)

## 2024-09-19 LAB — BASIC METABOLIC PANEL WITH GFR
Anion gap: 10 (ref 5–15)
BUN: 21 mg/dL (ref 8–23)
CO2: 28 mmol/L (ref 22–32)
Calcium: 8.9 mg/dL (ref 8.9–10.3)
Chloride: 104 mmol/L (ref 98–111)
Creatinine, Ser: 1.09 mg/dL — ABNORMAL HIGH (ref 0.44–1.00)
GFR, Estimated: 47 mL/min — ABNORMAL LOW (ref >60)
Glucose, Bld: 126 mg/dL — ABNORMAL HIGH (ref 70–99)
Potassium: 3.5 mmol/L (ref 3.5–5.1)
Sodium: 142 mmol/L (ref 135–145)

## 2024-09-19 MED ORDER — ATENOLOL 25 MG PO TABS
25.0000 mg | ORAL_TABLET | Freq: Every day | ORAL | Status: DC
  Administered 2024-09-20 – 2024-09-24 (×5): 25 mg via ORAL
  Filled 2024-09-19 (×5): qty 1

## 2024-09-19 MED ORDER — ACETAMINOPHEN 325 MG PO TABS: 650.0000 mg | ORAL_TABLET | Freq: Four times a day (QID) | ORAL | Status: DC | PRN

## 2024-09-19 MED ORDER — FUROSEMIDE 10 MG/ML IJ SOLN
20.0000 mg | Freq: Once | INTRAMUSCULAR | Status: AC
  Administered 2024-09-19: 20 mg via INTRAVENOUS
  Filled 2024-09-19: qty 4

## 2024-09-19 MED ORDER — ONDANSETRON HCL 4 MG/2ML IJ SOLN: 4.0000 mg | Freq: Three times a day (TID) | INTRAMUSCULAR | Status: DC | PRN

## 2024-09-19 MED ORDER — IOHEXOL 350 MG/ML SOLN
75.0000 mL | Freq: Once | INTRAVENOUS | Status: AC | PRN
  Administered 2024-09-19: 75 mL via INTRAVENOUS

## 2024-09-19 MED ORDER — DM-GUAIFENESIN ER 30-600 MG PO TB12: 1.0000 | ORAL_TABLET | Freq: Two times a day (BID) | ORAL | Status: DC | PRN

## 2024-09-19 MED ORDER — LISINOPRIL 5 MG PO TABS
5.0000 mg | ORAL_TABLET | Freq: Every day | ORAL | Status: DC
  Administered 2024-09-20 – 2024-09-24 (×5): 5 mg via ORAL
  Filled 2024-09-19 (×5): qty 1

## 2024-09-19 MED ORDER — ASPIRIN 325 MG PO TBEC
325.0000 mg | DELAYED_RELEASE_TABLET | Freq: Every day | ORAL | Status: DC
  Administered 2024-09-20 – 2024-09-24 (×5): 325 mg via ORAL
  Filled 2024-09-19 (×5): qty 1

## 2024-09-19 MED ORDER — ENOXAPARIN SODIUM 30 MG/0.3ML IJ SOSY
30.0000 mg | PREFILLED_SYRINGE | INTRAMUSCULAR | Status: DC
  Administered 2024-09-19 – 2024-09-20 (×2): 30 mg via SUBCUTANEOUS
  Filled 2024-09-19 (×2): qty 0.3

## 2024-09-19 MED ORDER — ENOXAPARIN SODIUM 40 MG/0.4ML IJ SOSY: 40.0000 mg | PREFILLED_SYRINGE | INTRAMUSCULAR | Status: DC

## 2024-09-19 MED ORDER — FUROSEMIDE 10 MG/ML IJ SOLN
20.0000 mg | Freq: Two times a day (BID) | INTRAMUSCULAR | Status: DC
  Administered 2024-09-19 – 2024-09-23 (×8): 20 mg via INTRAVENOUS
  Filled 2024-09-19 (×2): qty 2
  Filled 2024-09-19: qty 4
  Filled 2024-09-19 (×2): qty 2
  Filled 2024-09-19 (×3): qty 4

## 2024-09-19 MED ORDER — ALBUTEROL SULFATE (2.5 MG/3ML) 0.083% IN NEBU: 2.5000 mg | INHALATION_SOLUTION | RESPIRATORY_TRACT | Status: DC | PRN

## 2024-09-19 MED ORDER — DONEPEZIL HCL 5 MG PO TABS
10.0000 mg | ORAL_TABLET | Freq: Every day | ORAL | Status: DC
  Administered 2024-09-20 – 2024-09-23 (×4): 10 mg via ORAL
  Filled 2024-09-19 (×5): qty 2

## 2024-09-19 MED ORDER — SENNOSIDES-DOCUSATE SODIUM 8.6-50 MG PO TABS: 1.0000 | ORAL_TABLET | Freq: Every evening | ORAL | Status: DC | PRN

## 2024-09-19 MED ORDER — ALLOPURINOL 100 MG PO TABS
100.0000 mg | ORAL_TABLET | Freq: Every day | ORAL | Status: DC
  Administered 2024-09-20 – 2024-09-24 (×5): 100 mg via ORAL
  Filled 2024-09-19 (×5): qty 1

## 2024-09-19 MED ORDER — VITAMIN B-12 1000 MCG PO TABS
1000.0000 ug | ORAL_TABLET | Freq: Every day | ORAL | Status: DC
  Administered 2024-09-20 – 2024-09-24 (×5): 1000 ug via ORAL
  Filled 2024-09-19: qty 2
  Filled 2024-09-19: qty 1
  Filled 2024-09-19: qty 2
  Filled 2024-09-19 (×2): qty 1

## 2024-09-19 MED ORDER — HYDRALAZINE HCL 20 MG/ML IJ SOLN: 5.0000 mg | INTRAMUSCULAR | Status: DC | PRN

## 2024-09-19 MED ORDER — ALBUTEROL SULFATE HFA 108 (90 BASE) MCG/ACT IN AERS: 2.0000 | INHALATION_SPRAY | RESPIRATORY_TRACT | Status: DC | PRN

## 2024-09-19 NOTE — ED Notes (Signed)
Placed on 4L [place].

## 2024-09-19 NOTE — ED Provider Notes (Signed)
 [facility] Provider Note   Event Date/Time   First MD Initiated Contact with Patient 09/19/24 1528     (approximate) History  Weakness  HPI [name] Griffin is a 89 y.o. female with a past medical history of type 2 diabetes, hypertension, and peripheral vascular disease who presents complaining of of increasing shortness of breath.  Patient is demented and hard of hearing and therefore most history is obtained from patient's family at bedside stating that she has been increasingly lethargic, short of breath, and tachypneic over the last 3 days.  Family denies patient having any vomiting, fever, productive cough, recent travel, or sick contacts. ROS: Unable to assess   Physical Exam  Triage Vital Signs: ED Triage Vitals  Encounter Vitals Group     BP 09/19/24 1514 (!) 140/56     Girls Systolic BP Percentile --      Girls Diastolic BP Percentile --      Boys Systolic BP Percentile --      Boys Diastolic BP Percentile --      Pulse Rate 09/19/24 1514 69     Resp 09/19/24 1514 (!) 26     Temp 09/19/24 1518 98 F (36.7 C)     Temp Source 09/19/24 1518 Oral     SpO2 09/19/24 1514 (!) 83 %     Weight --      Height --      Head Circumference --      Peak Flow --      Pain Score --      Pain Loc --      Pain Education --      Exclude from Growth Chart --    Most recent vital signs: Vitals:   09/19/24 1518 09/19/24 1600  BP:  106/79  Pulse:  64  Resp:  20  Temp: 98 F (36.7 C)   SpO2:  100%   General: Awake, cooperative CV:  Good peripheral perfusion. Resp:  Increased effort.  Rales over bilateral lung bases Abd:  No distention. Other:  Elderly obese Caucasian female resting comfortably in mild respiratory distress ED Results / Procedures / Treatments  Labs (all labs ordered are listed, but only abnormal results are displayed) Labs Reviewed  BASIC METABOLIC PANEL WITH GFR - Abnormal; Notable for the following components:      Result Value    Glucose, Bld 126 (*)    Creatinine, Ser 1.09 (*)    GFR, Estimated 47 (*)    All other components within normal limits  CBC - Abnormal; Notable for the following components:   Hemoglobin 11.8 (*)    All other components within normal limits  BRAIN NATRIURETIC PEPTIDE - Abnormal; Notable for the following components:   B Natriuretic Peptide 305.8 (*)    All other components within normal limits  TROPONIN I (HIGH SENSITIVITY) - Abnormal; Notable for the following components:   Troponin I (High Sensitivity) 43 (*)    All other components within normal limits  RESP PANEL BY RT-PCR (RSV, FLU A&B, COVID)  RVPGX2  LACTIC ACID, PLASMA  BLOOD GAS, VENOUS  D-DIMER, QUANTITATIVE  BASIC METABOLIC PANEL WITH GFR  CBC  MAGNESIUM  TROPONIN I (HIGH SENSITIVITY)   EKG ED ECG REPORT I, Artist MARLA Kerns, the attending physician, personally viewed and interpreted this ECG. Date: 09/19/2024 EKG Time: 1528 Rate: 61 Rhythm: normal sinus rhythm QRS Axis: normal Intervals: normal ST/T Wave abnormalities: normal Narrative Interpretation: no evidence of acute ischemia RADIOLOGY  ED MD interpretation: 2 view chest x-ray independently interpreted shows cardiomegaly with diffuse bilateral interstitial opacity and small layering pleural effusions consistent with worsening edema - All radiology independently interpreted and agree with radiology assessment Official radiology report(s): DG Chest 2 View Result Date: 09/19/2024 CLINICAL DATA:  Shortness of breath, lethargy and weakness EXAM: CHEST - 2 VIEW COMPARISON:  07/23/2024 FINDINGS: Cardiomegaly. Diffuse bilateral interstitial opacity and small layering pleural effusions increased compared to prior examination. Dextroscoliosis thoracic spine. Chronic fracture deformity of the proximal right humerus. Severe left glenohumeral arthrosis. IMPRESSION: Cardiomegaly with diffuse bilateral interstitial opacity and small layering pleural effusions increased compared to  prior examination, consistent with worsened edema. No focal airspace opacity. Electronically Signed   By: Marolyn JONETTA Jaksch M.D.   On: 09/19/2024 16:03   PROCEDURES: Critical Care performed: Yes, see critical care procedure note(s) .1-3 Lead EKG Interpretation  Performed by: Jossie Artist POUR, MD Authorized by: Jossie Artist POUR, MD     Interpretation: normal     ECG rate:  71   ECG rate assessment: normal     Rhythm: sinus rhythm     Ectopy: none     Conduction: normal   CRITICAL CARE Performed by: Tamera Pingley K Jaze Rodino  Total critical care time: 33 minutes  Critical care time was exclusive of separately billable procedures and treating other patients.  Critical care was necessary to treat or prevent imminent or life-threatening deterioration.  Critical care was time spent personally by me on the following activities: development of treatment plan with patient and/or surrogate as well as nursing, discussions with consultants, evaluation of patient's response to treatment, examination of patient, obtaining history from patient or surrogate, ordering and performing treatments and interventions, ordering and review of laboratory studies, ordering and review of radiographic studies, pulse oximetry and re-evaluation of patient's condition.  MEDICATIONS ORDERED IN ED: Medications  dextromethorphan-guaiFENesin  (MUCINEX  DM) 30-600 MG per 12 hr tablet 1 tablet (has no administration in time range)  ondansetron  (ZOFRAN ) injection 4 mg (has no administration in time range)  hydrALAZINE  (APRESOLINE ) injection 5 mg (has no administration in time range)  acetaminophen  (TYLENOL ) tablet 650 mg (has no administration in time range)  albuterol  (PROVENTIL ) (2.5 MG/3ML) 0.083% nebulizer solution 2.5 mg (has no administration in time range)  enoxaparin (LOVENOX) injection 30 mg (has no administration in time range)  furosemide  (LASIX ) injection 20 mg (20 mg Intravenous Given 09/19/24 1822)   IMPRESSION / MDM /  ASSESSMENT AND PLAN / ED COURSE  I reviewed the triage vital signs and the nursing notes.                             The patient is on the cardiac monitor to evaluate for evidence of arrhythmia and/or significant heart rate changes. Patient's presentation is most consistent with acute presentation with potential threat to life or bodily function. Patient is 89 year old female with the above-stated past medical history presents for worsening respiratory distress as well as found to be hypoxic in triage and placed on 6 L nasal cannula. DDx: Pneumonia, pulmonary edema, pleural effusion, PE, DVT, viral pneumonia Plan: CBC, BMP, BNP, lactic acid, troponin, EKG, chest x-ray  Patient's chest x-ray showing likely scattered edema throughout the lungs with mildly elevated BNP to the 300s.  Given patient's new oxygen requirement, she will require admission in the internal medicine service for further evaluation and management.  20 mg IV Lasix  given prior to admission.  Spoke with Dr.  Niu on the hospitalist service who agrees to accept this patient for further evaluation and management.  Dispo: Admit to medicine   FINAL CLINICAL IMPRESSION(S) / ED DIAGNOSES   Final diagnoses:  Acute respiratory failure with hypoxia (HCC)  Acute pulmonary edema (HCC)   Rx / DC Orders   ED Discharge Orders     None      Note:  This document was prepared using Dragon voice recognition software and may include unintentional dictation errors.   Jossie Artist POUR, MD 09/19/24 [phone]

## 2024-09-19 NOTE — ED Triage Notes (Signed)
 Patient's son states patient has been lethargic and weak over the past 3 days; states her oxygen has been in the mid 70's

## 2024-09-19 NOTE — H&P (Addendum)
 History and Physical    [name] Griffin FMW:[id number] DOB: 1928-01-11 DOA: 09/19/2024  Referring MD/NP/PA:   PCP: Fernand Fredy RAMAN, MD   Patient coming from:  The patient is coming from home.     Chief Complaint: SOB  HPI: [name] Griffin is a 89 y.o. female with medical history significant of HTN, HLD, diet-controlled DM, gout, CKD-3A, memory loss, hard of hearing, recent left hip fracture, HOH, who presents with SOB.  Patient was recently hospitalized from 8/10 - 8/18 due to left hip fracture secondary to syncope.  Patient is s/p of ORIF.  She finished the rehab.  During that admission, patient had oxygen desaturation and 2D echo which showed EF of 60-85% with grade 1 diastolic dysfunction.   Per her son at the bedside, patient has SOB in the past 3 days which has been progressively worsening.  No active cough or chest pain.  No fever or chills.  Patient has bilateral lower leg edema.  No nausea, vomiting, diarrhea or abdominal pain.  No symptoms of UTI.   At her normal baseline, patient is not using oxygen, but was found to have oxygen desaturation to 70s, started on 6 L oxygen, improved to 100%.  Patient has moderate acute respiratory distress with difficulty speaking in full sentence.  Patient is mildly lethargic, but easily arousable, moves all extremities normally.  Data reviewed independently and ED Course: pt was found to have BNP 305.8, WBC 10.3, stable renal function, troponin 43, lactic acid 1.5, negative PCR for COVID, flu and RSV.  Temperature normal, blood pressure 106/79, heart rate 69, RR 26.  Chest x-ray showed cardiomegaly with pulmonary edema.  Patient is admitted to PCU as inpatient.   EKG: I have personally reviewed.  Sinus rhythm, QTc 427, low voltage.   Review of Systems:   General: no fevers, chills, no body weight gain, has fatigue HEENT: no blurry vision, sore throat. Has HOH Respiratory: has dyspnea, no coughing, wheezing CV: no chest pain, no  palpitations GI: no nausea, vomiting, abdominal pain, diarrhea, constipation GU: no dysuria, burning on urination, increased urinary frequency, hematuria  Ext: has leg edema Neuro: no unilateral weakness, numbness, or tingling, no vision change or hearing loss. Has lethargy. Skin: no rash, no skin tear. MSK: No muscle spasm, no deformity, no limitation of range of movement in spin Heme: No easy bruising.  Travel history: No recent long distant travel.   Allergy: No Known Allergies  Past Medical History:  Diagnosis Date   CKD (chronic kidney disease)    Diabetes mellitus without complication (HCC)    Essential hypertension, benign    Mixed hyperlipidemia     Past Surgical History:  Procedure Laterality Date   ORIF HIP FRACTURE Left 07/24/2024   Procedure: OPEN REDUCTION INTERNAL FIXATION HIP;  Surgeon: Maryrose Cordella Charleston, MD;  Location: [facility] ORS;  Service: Orthopedics;  Laterality: Left;  Fracture table, C arm, Synthes TFN-A    Social History:  reports that she has never smoked. She has never used smokeless tobacco. She reports that she does not currently use alcohol. She reports that she does not use drugs.  Family History:  Family History  Problem Relation Age of Onset   Diabetes Mother      Prior to Admission medications   Medication Sig Start Date End Date Taking? Authorizing Provider  acetaminophen  (TYLENOL ) 325 MG tablet Take 2 tablets (650 mg total) by mouth every 6 (six) hours as needed for mild pain (pain score 1-3) or  fever. 07/26/24   Charlene Debby BROCKS, PA-C  allopurinol  (ZYLOPRIM ) 100 MG tablet TAKE 1 TABLET BY MOUTH EVERY DAY 03/06/24   Fernand Fredy RAMAN, MD  aspirin  EC 325 MG tablet Take 1 tablet (325 mg total) by mouth daily. 07/27/24   Charlene Debby BROCKS, PA-C  atenolol  (TENORMIN ) 25 MG tablet TAKE 1 TABLET BY MOUTH EVERY DAY 12/16/23   Fernand Fredy RAMAN, MD  cyanocobalamin  1000 MCG tablet Take 1 tablet (1,000 mcg total) by mouth daily. 07/29/24   Jens Durand, MD   donepezil  (ARICEPT ) 10 MG tablet Take 1 tablet (10 mg total) by mouth at bedtime. 01/27/24 01/26/25  Fernand Fredy RAMAN, MD  furosemide  (LASIX ) 20 MG tablet TAKE 1 TABLET BY MOUTH EVERY DAY 06/02/24   Fernand Fredy RAMAN, MD  lisinopril  (ZESTRIL ) 5 MG tablet TAKE 1 TABLET BY MOUTH EVERY DAY 05/16/24   Fernand Fredy RAMAN, MD  senna-docusate (SENOKOT-S) 8.6-50 MG tablet Take 1 tablet by mouth at bedtime as needed for mild constipation. 07/28/24   Jens Durand, MD    Physical Exam: Vitals:   09/19/24 1516 09/19/24 1518 09/19/24 1600 09/19/24 1828  BP:   106/79   Pulse:   64   Resp:   20   Temp:  98 F (36.7 C)    TempSrc:  Oral    SpO2: 94%  100%   Weight:    76.4 kg   General: Has moderate acute respiratory distress. HEENT:       Eyes: PERRL, EOMI, no jaundice       ENT: No discharge from the ears and nose, no pharynx injection, no tonsillar enlargement.        Neck: Difficult to assess JVD due to obesity, no bruit, no mass felt. Heme: No neck lymph node enlargement. Cardiac: S1/S2, RRR, No murmurs, No gallops or rubs. Respiratory: Has fine crackles bilaterally. GI: Soft, nondistended, nontender, no rebound pain, no organomegaly, BS present. GU: No hematuria Ext: 2+ pitting leg edema bilaterally. 1+DP/PT pulse bilaterally. Musculoskeletal: No joint deformities, No joint redness or warmth, no limitation of ROM in spin. Skin: No rashes.  Neuro: Mildly lethargic, easily arousable, following commands, cranial nerves II-XII grossly intact, moves all extremities. Psych: Patient is not psychotic, no suicidal or hemocidal ideation.  Labs on Admission: I have personally reviewed following labs and imaging studies  CBC: Recent Labs  Lab 09/19/24 1519  WBC 10.3  HGB 11.8*  HCT 38.1  MCV 96.7  PLT 235   Basic Metabolic Panel: Recent Labs  Lab 09/19/24 1519  NA 142  K 3.5  CL 104  CO2 28  GLUCOSE 126*  BUN 21  CREATININE 1.09*  CALCIUM 8.9   GFR: Estimated Creatinine Clearance: 28.8  mL/min (A) (by C-G formula based on SCr of 1.09 mg/dL (H)). Liver Function Tests: No results for input(s): AST, ALT, ALKPHOS, BILITOT, PROT, ALBUMIN in the last 168 hours. No results for input(s): LIPASE, AMYLASE in the last 168 hours. No results for input(s): AMMONIA in the last 168 hours. Coagulation Profile: No results for input(s): INR, PROTIME in the last 168 hours. Cardiac Enzymes: No results for input(s): CKTOTAL, CKMB, CKMBINDEX, TROPONINI in the last 168 hours. BNP (last 3 results) No results for input(s): PROBNP in the last 8760 hours. HbA1C: No results for input(s): HGBA1C in the last 72 hours. CBG: No results for input(s): GLUCAP in the last 168 hours. Lipid Profile: No results for input(s): CHOL, HDL, LDLCALC, TRIG, CHOLHDL, LDLDIRECT in the last 72 hours. Thyroid Function  Tests: No results for input(s): TSH, T4TOTAL, FREET4, T3FREE, THYROIDAB in the last 72 hours. Anemia Panel: No results for input(s): VITAMINB12, FOLATE, FERRITIN, TIBC, IRON, RETICCTPCT in the last 72 hours. Urine analysis: No results found for: COLORURINE, APPEARANCEUR, LABSPEC, PHURINE, GLUCOSEU, HGBUR, BILIRUBINUR, KETONESUR, PROTEINUR, UROBILINOGEN, NITRITE, LEUKOCYTESUR Sepsis Labs: @LABRCNTIP (procalcitonin:4,lacticidven:4) ) Recent Results (from the past 240 hours)  Resp panel by RT-PCR (RSV, Flu A&B, Covid) Anterior Nasal Swab     Status: None   Collection Time: 09/19/24  4:05 PM   Specimen: Anterior Nasal Swab  Result Value Ref Range Status   SARS Coronavirus 2 by RT PCR NEGATIVE NEGATIVE Final    Comment: (NOTE) SARS-CoV-2 target nucleic acids are NOT DETECTED.  The SARS-CoV-2 RNA is generally detectable in upper respiratory specimens during the acute phase of infection. The lowest concentration of SARS-CoV-2 viral copies this assay can detect is 138 copies/mL. A negative result does not preclude  SARS-Cov-2 infection and should not be used as the sole basis for treatment or other patient management decisions. A negative result may occur with  improper specimen collection/handling, submission of specimen other than nasopharyngeal swab, presence of viral mutation(s) within the areas targeted by this assay, and inadequate number of viral copies(<138 copies/mL). A negative result must be combined with clinical observations, patient history, and epidemiological information. The expected result is Negative.  Fact Sheet for Patients:  BloggerCourse.com  Fact Sheet for Healthcare Providers:  SeriousBroker.it  This test is no t yet approved or cleared by the [place]  FDA and  has been authorized for detection and/or diagnosis of SARS-CoV-2 by FDA under an Emergency Use Authorization (EUA). This EUA will remain  in effect (meaning this test can be used) for the duration of the COVID-19 declaration under Section 564(b)(1) of the Act, 21 U.S.C.section 360bbb-3(b)(1), unless the authorization is terminated  or revoked sooner.       Influenza A by PCR NEGATIVE NEGATIVE Final   Influenza B by PCR NEGATIVE NEGATIVE Final    Comment: (NOTE) The Xpert Xpress SARS-CoV-2/FLU/RSV plus assay is intended as an aid in the diagnosis of influenza from Nasopharyngeal swab specimens and should not be used as a sole basis for treatment. Nasal washings and aspirates are unacceptable for Xpert Xpress SARS-CoV-2/FLU/RSV testing.  Fact Sheet for Patients: BloggerCourse.com  Fact Sheet for Healthcare Providers: SeriousBroker.it  This test is not yet approved or cleared by the [place]  FDA and has been authorized for detection and/or diagnosis of SARS-CoV-2 by FDA under an Emergency Use Authorization (EUA). This EUA will remain in effect (meaning this test can be used) for the duration of  the COVID-19 declaration under Section 564(b)(1) of the Act, 21 U.S.C. section 360bbb-3(b)(1), unless the authorization is terminated or revoked.     Resp Syncytial Virus by PCR NEGATIVE NEGATIVE Final    Comment: (NOTE) Fact Sheet for Patients: BloggerCourse.com  Fact Sheet for Healthcare Providers: SeriousBroker.it  This test is not yet approved or cleared by the [place]  FDA and has been authorized for detection and/or diagnosis of SARS-CoV-2 by FDA under an Emergency Use Authorization (EUA). This EUA will remain in effect (meaning this test can be used) for the duration of the COVID-19 declaration under Section 564(b)(1) of the Act, 21 U.S.C. section 360bbb-3(b)(1), unless the authorization is terminated or revoked.  Performed at [facility], [address], [place], [place] [address]      Radiological Exams on Admission:   Assessment/Plan Principal Problem:   Acute on chronic diastolic  CHF (congestive heart failure) (HCC) Active Problems:   Acute respiratory failure with hypoxia (HCC)   Myocardial injury   Essential hypertension, benign   Chronic kidney disease, stage 3a (HCC)   Idiopathic gout   Memory impairment   Obesity (BMI 30-39.9)   Assessment and Plan:  Acute respiratory failure with hypoxia due to acute on chronic diastolic CHF (congestive heart failure) (HCC): 2D echo on 07/25/2024 showed EF of 60 to 65% with grade 1 diastolic dysfunction.  Patient has SOB, 2+ leg edema, elevated BNP 305, pulm edema by chest x-ray, clinically consistent with CHF exacerbation.  Patient has new 6 L oxygen requirement now.  Due to recent left hip fracture, patient is at high risk of developing PE which needed to be ruled out  -Will admit to PCU as inpatient -Lasix  20 mg bid by IV (will two dose of 20 mg tonight) -Daily weights -strict I/O's -Low salt diet -Fluid restriction -As needed bronchodilators for  shortness of breath - check VBG - f/u D-dimer. If it is positive --> will do CTA to rule out PE.  Addendum: D-dimer is positive at 2.54 - CTA to rule out PE --> negative for PE - Lower extremity venous Doppler to rule out a DVT --> negative DVT   Myocardial injury: Troponin 43, no chest pain, likely demand ischemia. - Aspirin  - Trend troponin  Essential hypertension, benign -IV hydralazine  as needed - Atenolol , lisinopril   Chronic kidney disease, stage 3a (HCC): Renal function stable.  Recent creatinine 1.09 at 08/28/2024.  Her creatinine is 1.09, BUN 21, GFR 47 today. -Follow-up with BMP  Idiopathic gout -Allopurinol   Memory impairment -Donepezil   Obesity (BMI 30-39.9): Patient has Obesity Class I, with body weight 76.4 Kg and BMI 32.89 kg/m2.  - Encourage losing weight - Exercise and healthy diet        DVT ppx:  SQ Lovenox  Code Status: DNR per pt and her son  Family Communication: Yes, patient's son and daughter-in-law    at bed side.        Disposition Plan:  Anticipate discharge back to previous environment  Consults called: None  Admission status and Level of care: Progressive:  as inpt        Dispo: The patient is from: Home              Anticipated d/c is to: Home              Anticipated d/c date is: 2 days              Patient currently is not medically stable to d/c.    Severity of Illness:  The appropriate patient status for this patient is INPATIENT. Inpatient status is judged to be reasonable and necessary in order to provide the required intensity of service to ensure the patient's safety. The patient's presenting symptoms, physical exam findings, and initial radiographic and laboratory data in the context of their chronic comorbidities is felt to place them at high risk for further clinical deterioration. Furthermore, it is not anticipated that the patient will be medically stable for discharge from the hospital within 2 midnights of  admission.   * I certify that at the point of admission it is my clinical judgment that the patient will require inpatient hospital care spanning beyond 2 midnights from the point of admission due to high intensity of service, high risk for further deterioration and high frequency of surveillance required.*       Date of Service  09/19/2024    Izzabell Klasen [facility]   If 7PM-7AM, please contact night-coverage [email or web address] 09/19/2024, 6:54 PM

## 2024-09-19 NOTE — Progress Notes (Signed)
 PHARMACIST - PHYSICIAN COMMUNICATION  CONCERNING:  Enoxaparin (Lovenox) for DVT Prophylaxis    RECOMMENDATION: Patient was prescribed enoxaprin 40mg  q24 hours for VTE prophylaxis.   Filed Weights   09/19/24 1828  Weight: 76.4 kg (168 lb 6.4 oz)    Body mass index is 32.89 kg/m.  Estimated Creatinine Clearance: 28.8 mL/min (A) (by C-G formula based on SCr of 1.09 mg/dL (H)).   Patient is candidate for enoxaparin 30mg  every 24 hours based on CrCl <26ml/min   DESCRIPTION: Pharmacy has adjusted enoxaparin dose per [facility] policy.  Patient is now receiving enoxaparin 30 mg every 24 hours    Ransom Blanch PGY-1 Pharmacy Resident  [facility]  09/19/2024 6:31 PM

## 2024-09-19 NOTE — ED Notes (Signed)
Increased O2 to 6L NC

## 2024-09-20 ENCOUNTER — Telehealth: Payer: Self-pay | Admitting: Internal Medicine

## 2024-09-20 DIAGNOSIS — I5033 Acute on chronic diastolic (congestive) heart failure: Secondary | ICD-10-CM | POA: Diagnosis not present

## 2024-09-20 LAB — BLOOD GAS, VENOUS
Bicarbonate: 37.5 mmol/L — ABNORMAL HIGH (ref 20.0–28.0)
Patient temperature: 37 mmol/L — AB (ref 0.0–2.0)
pCO2, Ven: 68 mmHg — ABNORMAL HIGH (ref 44–60)
pH, Ven: 7.35 (ref 7.25–7.43)
pO2, Ven: 37.5 mmHg — AB (ref 32–45)

## 2024-09-20 LAB — TROPONIN I (HIGH SENSITIVITY): Troponin I (High Sensitivity): 39 ng/L — ABNORMAL HIGH (ref <18)

## 2024-09-20 LAB — CBC
HCT: 37.3 % (ref 36.0–46.0)
Hemoglobin: 11.4 g/dL — ABNORMAL LOW (ref 12.0–15.0)
MCH: 29.5 pg (ref 26.0–34.0)
MCHC: 30.6 g/dL (ref 30.0–36.0)
MCV: 96.4 fL (ref 80.0–100.0)
Platelets: 213 K/uL (ref 150–400)
RBC: 3.87 MIL/uL (ref 3.87–5.11)
RDW: 14.6 % (ref 11.5–15.5)
WBC: 10.8 K/uL — ABNORMAL HIGH (ref 4.0–10.5)
nRBC: 0 % (ref 0.0–0.2)

## 2024-09-20 LAB — BASIC METABOLIC PANEL WITH GFR
Anion gap: 10 (ref 5–15)
BUN: 17 mg/dL (ref 8–23)
CO2: 30 mmol/L (ref 22–32)
Calcium: 8.6 mg/dL — ABNORMAL LOW (ref 8.9–10.3)
Chloride: 102 mmol/L (ref 98–111)
Creatinine, Ser: 1.06 mg/dL — ABNORMAL HIGH (ref 0.44–1.00)
GFR, Estimated: 49 mL/min — ABNORMAL LOW (ref >60)
Glucose, Bld: 94 mg/dL (ref 70–99)
Potassium: 3.2 mmol/L — ABNORMAL LOW (ref 3.5–5.1)
Sodium: 142 mmol/L (ref 135–145)

## 2024-09-20 LAB — MAGNESIUM: Magnesium: 1.9 mg/dL (ref 1.7–2.4)

## 2024-09-20 MED ORDER — POTASSIUM CHLORIDE CRYS ER 20 MEQ PO TBCR
40.0000 meq | EXTENDED_RELEASE_TABLET | Freq: Once | ORAL | Status: AC
Start: 2024-09-20 — End: 2024-09-20
  Administered 2024-09-20: 40 meq via ORAL
  Filled 2024-09-20: qty 2

## 2024-09-20 NOTE — ED Notes (Signed)
 Dr. Lue removed pt from oxygen and advised as long as pt pulse ox remains at least 88% on RA he is okay with her being on RA.

## 2024-09-20 NOTE — Progress Notes (Addendum)
 PROGRESS NOTE    [name] Griffin  FMW:[id number] DOB: 1928-09-30 DOA: 09/19/2024 PCP: Fernand Fredy RAMAN, MD   Brief Narrative:  [name] Griffin is a 89 y.o. female with medical history significant of HTN, HLD, diet-controlled DM, gout, CKD-3A, memory loss, hard of hearing, recent left hip fracture, HOH, who presents with SOB/orthopnea, dyspnea exertion and profound lower extremity edema   Assessment & Plan:   Principal Problem:   Acute on chronic diastolic CHF (congestive heart failure) (HCC) Active Problems:   Acute respiratory failure with hypoxia (HCC)   Myocardial injury   Essential hypertension, benign   Chronic kidney disease, stage 3a (HCC)   Idiopathic gout   Memory impairment   Obesity (BMI 30-39.9)  Acute respiratory failure with hypoxia Secondary to acute on chronic diastolic heart failure -Echo on 07/25/2024 with EF 60 to 65% and grade 1 diastolic dysfunction  - Clinically volume overloaded at intake with improving volume status, lower extremity edema improving daily - BNP 305, patient notably obese so likely under estimated - Pulmonary edema noted on imaging bilaterally - Continue to wean oxygen as appropriate - Continue diuretics as tolerated - Low-salt low-fat fluid restricted diet  Hypokalemia, mild  - Repleted, follow repeat labs, likely secondary to diuretics in the setting of CKD  PE ruled out DVT ruled out -Elevated risk in setting of recent hip fracture with elevated D-dimer, imaging unremarkable -no indication for anticoagulation   Type II elevated troponin in the setting of hypoxia and heart failure exacerbation  -Consistent with demand ischemia, ACS unlikely -no longer following troponin given flat trend   Essential hypertension, benign -IV hydralazine  as needed - Atenolol , lisinopril    Chronic kidney disease, stage 3a :  - Baseline creatinine around 1.1 -stable, follow given diuretics as above -Low threshold to decrease diuretics should patient's  renal function decline  Idiopathic gout -Allopurinol  Memory impairment, nonspecific-Donepezil   DVT prophylaxis: enoxaparin (LOVENOX) injection 30 mg Start: 09/19/24 2200 Code Status:   Code Status: Limited: Do not attempt resuscitation (DNR) -DNR-LIMITED -Do Not Intubate/DNI  Family Communication: Children at bedside  Status is: Inpatient  Dispo: The patient is from: Home              Anticipated d/c is to: Home              Anticipated d/c date is: 24 to 48 hours              Patient currently not medically stable for discharge  Consultants:  None  Procedures:  None  Antimicrobials:  None indicated  Subjective: No acute issues or events overnight, respiratory status improving but not yet back to baseline.  Otherwise denies nausea vomiting diarrhea constipation headache fevers chills or chest pain.  Objective: Vitals:   09/20/24 0800 09/20/24 0939 09/20/24 0941 09/20/24 0945  BP: 126/85  126/85 (!) 125/53  Pulse:   80 75  Resp: 19   20  Temp:  98.1 F (36.7 C)    TempSrc:  Oral    SpO2: 93%   98%  Weight:       No intake or output data in the 24 hours ending 09/20/24 1110 Filed Weights   09/19/24 1828  Weight: 76.4 kg    Examination:  General:  Pleasantly resting in bed, No acute distress. HEENT:  Normocephalic atraumatic.  Sclerae nonicteric, noninjected.  Extraocular movements intact bilaterally. Neck:  Without mass or deformity.  Trachea is midline. Lungs:  Clear to auscultate bilaterally without rhonchi, wheeze,  or rales. Heart:  Regular rate and rhythm.  Without murmurs, rubs, or gallops. Abdomen:  Soft, nontender, nondistended.  Without guarding or rebound. Extremities: Without cyanosis, clubbing -1+ pitting edema bilateral lower extremities below the knee    Data Reviewed: I have personally reviewed following labs and imaging studies  CBC: Recent Labs  Lab 09/19/24 1519 09/20/24 0218  WBC 10.3 10.8*  HGB 11.8* 11.4*  HCT 38.1 37.3  MCV 96.7  96.4  PLT 235 213   Basic Metabolic Panel: Recent Labs  Lab 09/19/24 1519 09/20/24 0218  NA 142 142  K 3.5 3.2*  CL 104 102  CO2 28 30  GLUCOSE 126* 94  BUN 21 17  CREATININE 1.09* 1.06*  CALCIUM 8.9 8.6*  MG  --  1.9   GFR: Estimated Creatinine Clearance: 29.7 mL/min (A) (by C-G formula based on SCr of 1.06 mg/dL (H)).  Sepsis Labs: Recent Labs  Lab 09/19/24 1600  LATICACIDVEN 1.2    Recent Results (from the past 240 hours)  Resp panel by RT-PCR (RSV, Flu A&B, Covid) Anterior Nasal Swab     Status: None   Collection Time: 09/19/24  4:05 PM   Specimen: Anterior Nasal Swab  Result Value Ref Range Status   SARS Coronavirus 2 by RT PCR NEGATIVE NEGATIVE Final    Comment: (NOTE) SARS-CoV-2 target nucleic acids are NOT DETECTED.  The SARS-CoV-2 RNA is generally detectable in upper respiratory specimens during the acute phase of infection. The lowest concentration of SARS-CoV-2 viral copies this assay can detect is 138 copies/mL. A negative result does not preclude SARS-Cov-2 infection and should not be used as the sole basis for treatment or other patient management decisions. A negative result may occur with  improper specimen collection/handling, submission of specimen other than nasopharyngeal swab, presence of viral mutation(s) within the areas targeted by this assay, and inadequate number of viral copies(<138 copies/mL). A negative result must be combined with clinical observations, patient history, and epidemiological information. The expected result is Negative.  Fact Sheet for Patients:  BloggerCourse.com  Fact Sheet for Healthcare Providers:  SeriousBroker.it  This test is no t yet approved or cleared by the [place]  FDA and  has been authorized for detection and/or diagnosis of SARS-CoV-2 by FDA under an Emergency Use Authorization (EUA). This EUA will remain  in effect (meaning this test can be  used) for the duration of the COVID-19 declaration under Section 564(b)(1) of the Act, 21 U.S.C.section 360bbb-3(b)(1), unless the authorization is terminated  or revoked sooner.       Influenza A by PCR NEGATIVE NEGATIVE Final   Influenza B by PCR NEGATIVE NEGATIVE Final    Comment: (NOTE) The Xpert Xpress SARS-CoV-2/FLU/RSV plus assay is intended as an aid in the diagnosis of influenza from Nasopharyngeal swab specimens and should not be used as a sole basis for treatment. Nasal washings and aspirates are unacceptable for Xpert Xpress SARS-CoV-2/FLU/RSV testing.  Fact Sheet for Patients: BloggerCourse.com  Fact Sheet for Healthcare Providers: SeriousBroker.it  This test is not yet approved or cleared by the [place]  FDA and has been authorized for detection and/or diagnosis of SARS-CoV-2 by FDA under an Emergency Use Authorization (EUA). This EUA will remain in effect (meaning this test can be used) for the duration of the COVID-19 declaration under Section 564(b)(1) of the Act, 21 U.S.C. section 360bbb-3(b)(1), unless the authorization is terminated or revoked.     Resp Syncytial Virus by PCR NEGATIVE NEGATIVE Final    Comment: (NOTE) Fact  Sheet for Patients: BloggerCourse.com  Fact Sheet for Healthcare Providers: SeriousBroker.it  This test is not yet approved or cleared by the [place]  FDA and has been authorized for detection and/or diagnosis of SARS-CoV-2 by FDA under an Emergency Use Authorization (EUA). This EUA will remain in effect (meaning this test can be used) for the duration of the COVID-19 declaration under Section 564(b)(1) of the Act, 21 U.S.C. section 360bbb-3(b)(1), unless the authorization is terminated or revoked.  Performed at [facility], [address], [place], [place] [address]          Radiology Studies: CT Angio  Chest Pulmonary Embolism (PE) W or WO Contrast Result Date: 09/19/2024 CLINICAL DATA:  Pulmonary embolus suspected with high probability. Shortness of breath. Chronic diastolic congestive heart failure. EXAM: CT ANGIOGRAPHY CHEST WITH CONTRAST TECHNIQUE: Multidetector CT imaging of the chest was performed using the standard protocol during bolus administration of intravenous contrast. Multiplanar CT image reconstructions and MIPs were obtained to evaluate the vascular anatomy. RADIATION DOSE REDUCTION: This exam was performed according to the departmental dose-optimization program which includes automated exposure control, adjustment of the mA and/or kV according to patient size and/or use of iterative reconstruction technique. CONTRAST:  75mL OMNIPAQUE  IOHEXOL  350 MG/ML SOLN COMPARISON:  Chest radiograph 09/19/2024.  CT chest 07/23/2024 FINDINGS: Cardiovascular: Technically adequate study with good opacification of the central and segmental pulmonary arteries. No focal filling defects. No evidence of significant pulmonary embolus. Normal heart size. No pericardial effusions. Normal caliber thoracic aorta. Calcification of the aorta and coronary arteries. Great vessel origins are patent. Mediastinum/Nodes: Large substernal left thyroid gland nodule measuring 3.7 x 4.8 cm in diameter. No change since prior study. No imaging follow-up is indicated based on patient's age. Mediastinal and hilar lymphadenopathy with largest pretracheal lymph nodes measuring up to about 1.6 cm short axis dimension. Lymphadenopathy is unchanged since prior study. Etiology is nonspecific, possibly reactive or neoplastic. Esophagus is decompressed. Small esophageal hiatal hernia. Lungs/Pleura: Small right pleural effusion. Diffuse alveolar and interstitial infiltrates throughout both lungs demonstrating progression since prior study. This is likely multifocal pneumonia or aspiration. Some mucous plugging is demonstrated in the lung bases.  No pneumothorax. Upper Abdomen: 1.4 cm right adrenal gland nodule measuring 5 Hounsfield units density and unchanged since prior study. This is likely a benign fat containing adenoma and no additional imaging follow-up is indicated. Musculoskeletal: Degenerative changes in the spine and shoulders. Old fracture deformity of the proximal right humerus. No acute bony abnormalities. Review of the MIP images confirms the above findings. IMPRESSION: 1. No evidence of significant pulmonary embolus. 2. Diffuse alveolar and interstitial infiltrates throughout both lungs with progression since prior study, likely representing multifocal pneumonia or aspiration. 3. Small right pleural effusion with atelectasis in the right base. 4. Aortic atherosclerosis. Electronically Signed   By: Elsie Gravely M.D.   On: 09/19/2024 21:26   US  Venous Img Lower Bilateral (DVT) Result Date: 09/19/2024 CLINICAL DATA:  220372 Positive D dimer 220372, pain edema and injury. EXAM: BILATERAL LOWER EXTREMITY VENOUS DOPPLER ULTRASOUND TECHNIQUE: Gray-scale sonography with graded compression, as well as color Doppler and duplex ultrasound were performed to evaluate the lower extremity deep venous systems from the level of the common femoral vein and including the common femoral, femoral, profunda femoral, popliteal and calf veins including the posterior tibial, peroneal and gastrocnemius veins when visible. The superficial great saphenous vein was also interrogated. Spectral Doppler was utilized to evaluate flow at rest and with distal augmentation maneuvers in the common  femoral, femoral and popliteal veins. COMPARISON:  None Available. FINDINGS: RIGHT LOWER EXTREMITY Common Femoral Vein: No evidence of thrombus. Normal compressibility, respiratory phasicity and response to augmentation. Saphenofemoral Junction: No evidence of thrombus. Normal compressibility and flow on color Doppler imaging. Profunda Femoral Vein: No evidence of thrombus.  Normal compressibility and flow on color Doppler imaging. Femoral Vein: No evidence of thrombus. Normal compressibility, respiratory phasicity and response to augmentation. Popliteal Vein: No evidence of thrombus. Normal compressibility, respiratory phasicity and response to augmentation. Calf Veins: Limited evaluation of the peroneal vein. No posterior tibial vein thrombus. Superficial Great Saphenous Vein: No evidence of thrombus. Normal compressibility. Venous Reflux:  None. Other Findings:  None. LEFT LOWER EXTREMITY Common Femoral Vein: No evidence of thrombus. Normal compressibility, respiratory phasicity and response to augmentation. Saphenofemoral Junction: No evidence of thrombus. Normal compressibility and flow on color Doppler imaging. Profunda Femoral Vein: No evidence of thrombus. Normal compressibility and flow on color Doppler imaging. Femoral Vein: No evidence of thrombus. Normal compressibility, respiratory phasicity and response to augmentation. Popliteal Vein: No evidence of thrombus. Normal compressibility, respiratory phasicity and response to augmentation. Calf Veins: Limited evaluation. Superficial Great Saphenous Vein: No evidence of thrombus. Normal compressibility. Venous Reflux:  None. Other Findings:  None. IMPRESSION: No evidence of deep venous thrombosis in either lower extremity. Limited evaluation of the right perineal, left posterior tibial, left peroneal veins. Electronically Signed   By: Morgane  Naveau M.D.   On: 09/19/2024 20:45   DG Chest 2 View Result Date: 09/19/2024 CLINICAL DATA:  Shortness of breath, lethargy and weakness EXAM: CHEST - 2 VIEW COMPARISON:  07/23/2024 FINDINGS: Cardiomegaly. Diffuse bilateral interstitial opacity and small layering pleural effusions increased compared to prior examination. Dextroscoliosis thoracic spine. Chronic fracture deformity of the proximal right humerus. Severe left glenohumeral arthrosis. IMPRESSION: Cardiomegaly with diffuse  bilateral interstitial opacity and small layering pleural effusions increased compared to prior examination, consistent with worsened edema. No focal airspace opacity. Electronically Signed   By: Marolyn JONETTA Jaksch M.D.   On: 09/19/2024 16:03        Scheduled Meds:  allopurinol   100 mg Oral Daily   aspirin  EC  325 mg Oral Daily   atenolol   25 mg Oral Daily   cyanocobalamin   1,000 mcg Oral Daily   donepezil   10 mg Oral QHS   enoxaparin (LOVENOX) injection  30 mg Subcutaneous Q24H   furosemide   20 mg Intravenous Q12H   lisinopril   5 mg Oral Daily   Continuous Infusions:   LOS: 1 day   Time spent:  Elsie JAYSON Montclair, DO [facility]  If 7PM-7AM, please contact night-coverage [email or web address]  09/20/2024, 11:10 AM

## 2024-09-20 NOTE — Telephone Encounter (Signed)
 PT with [facility] left VM yesterday at 11:47 am stating patient's o2 level was running between 80-84 and they wanted a call back at [phone].    I was out of the office yesterday and when I returned this morning and checked my VM I found this VM as a saved VM on my phone. Luckily, patient has already been taken to ED and admitted.

## 2024-09-20 NOTE — TOC CM/SW Note (Signed)
 Transition of Care (TOC) CM/SW Note    .Transition of Care [facility]) - Inpatient Brief Assessment   Patient Details  Name: [name] Griffin MRN: [id number] Date of Birth: 12-May-1928  Transition of Care [facility]) CM/SW Contact:    Edsel DELENA Fischer, LCSW Phone Number: 09/20/2024, 12:11 PM   Clinical Narrative:  TOC to handoff to heart failure team  Transition of Care Asessment:

## 2024-09-20 NOTE — ED Notes (Signed)
 Patient had an episode of urinary incontinence at this time. Linens changed, new chux pad, and new brief placed on patient at this time. Bed low and locked. Fall bundle is in placed. Call bell in reach.

## 2024-09-20 NOTE — Evaluation (Signed)
 Physical Therapy Evaluation Patient Details Name: [name] Griffin MRN: [id number] DOB: [birth year] Today's Date: 09/20/2024  History of Present Illness  Pt is a 89 y/o F presenting to ED with SOB. Recent hospitalization 8/10-8/18 due to L hip fracture 2/2 syncope, was d/c to [facility]. Workup for acute respiratory failure with hypoxia due to acute on chronic diastolic CHF. PMH significant for HTN, HLD, diet-controlled DM, gout, CKD-3A, memory loss, recent L hip fx s/p ORIF.   Clinical Impression  Pt alert, pleasantly confused, agreeable to participate in PT evaluation. At most recent baseline, pt is a limited household amb with rollator, has required increased assistance from family for ADLs. Pt was met semi-supine in bed, minA for bed mobility with max VC for sequencing/bed rail assist. ModA required for STS and step-pivot transfers with mobility appearing to be very effortful for pt. Attempted O2 weaning with RN permission, pt began to desat to mid 78s on RA after transfer to sitting, on 2L for remainder of session with SpO2 in mid 80s after transfer to [facility], returned to 94% by end of session. Pt was left semi-supine in bed, all needs in reach, supportive family at bedside. Pt would benefit from skilled PT intervention to address listed deficits (see PT Problem List) and allow for safe return to PLOF.         If plan is discharge home, recommend the following: A lot of help with walking and/or transfers;A lot of help with bathing/dressing/bathroom;Assistance with cooking/housework;Direct supervision/assist for medications management;Assist for transportation;Help with stairs or ramp for entrance;Supervision due to cognitive status   Can travel by private vehicle        Equipment Recommendations None recommended by PT (pt has recommended DME)  Recommendations for Other Services       Functional Status Assessment Patient has had a recent decline in their functional status and demonstrates the  ability to make significant improvements in function in a reasonable and predictable amount of time.     Precautions / Restrictions Precautions Precautions: Fall Recall of Precautions/Restrictions: Intact Restrictions Weight Bearing Restrictions Per Provider Order: No      Mobility  Bed Mobility Overal bed mobility: Needs Assistance Bed Mobility: Supine to Sit, Sit to Supine     Supine to sit: Min assist, HOB elevated, Used rails Sit to supine: Min assist   General bed mobility comments: minA trunk/BLE assist with max multimodal cues for sequencing and bed rail assist    Transfers Overall transfer level: Needs assistance Equipment used: Rolling walker (2 wheels), None Transfers: Sit to/from Stand, Bed to chair/wheelchair/BSC Sit to Stand: Mod assist   Step pivot transfers: Mod assist       General transfer comment: max multimodal cues for sequencing, modA to assist with rising from EOB and to guide hips toward [facility]    Ambulation/Gait                  Stairs            Wheelchair Mobility     Tilt Bed    Modified Rankin (Stroke Patients Only)       Balance                                             Pertinent Vitals/Pain Pain Assessment Pain Assessment: No/denies pain    Home Living Family/patient expects to be discharged to::  Private residence Living Arrangements: Children Available Help at Discharge: Family;Available 24 hours/day Type of Home: Mobile home Home Access: Ramped entrance (+1 clearance into front door)       Home Layout: One level Home Equipment: Rollator (4 wheels);BSC/3in1;Grab bars - tub/shower;Wheelchair - manual      Prior Function Prior Level of Function : Needs assist             Mobility Comments: Per pt's family, she is able to amb short distances t/from bathroom with rollator, has recent hx of fall from previous admission ADLs Comments: Pt's family has been helping her with ADLs since  return from [facility]     Extremity/Trunk Assessment   Upper Extremity Assessment Upper Extremity Assessment: Generalized weakness    Lower Extremity Assessment Lower Extremity Assessment: Generalized weakness       Communication   Communication Communication: Impaired Factors Affecting Communication: Hearing impaired    Cognition Arousal: Alert Behavior During Therapy: WFL for tasks assessed/performed   PT - Cognitive impairments: History of cognitive impairments                       PT - Cognition Comments: pleasantly confused, oriented to self only Following commands: Impaired Following commands impaired: Follows one step commands with increased time     Cueing Cueing Techniques: Verbal cues, Tactile cues, Visual cues     General Comments      Exercises Other Exercises Other Exercises: SpO2 monitored during session: 97% at rest on 2L [place], removed supplemental O2- 93% on RA at rest, after transfer > sitting EOB, desat to mif to high 70s. Pt returned to 2L and SpO2 improved to 88%. After transfer > BSC SpO2 in low 80s. SpO2 94% at end of session on 2L [place]   Assessment/Plan    PT Assessment Patient needs continued PT services  PT Problem List Decreased strength;Decreased activity tolerance;Decreased balance;Decreased mobility;Cardiopulmonary status limiting activity       PT Treatment Interventions DME instruction;Gait training;Functional mobility training;Therapeutic activities;Therapeutic exercise;Balance training;Neuromuscular re-education;Cognitive remediation;Patient/family education    PT Goals (Current goals can be found in the Care Plan section)  Acute Rehab PT Goals Patient Stated Goal: to go home PT Goal Formulation: With patient/family Time For Goal Achievement: 10/04/24 Potential to Achieve Goals: Fair    Frequency Min 2X/week     Co-evaluation               AM-PAC PT 6 Clicks Mobility  Outcome Measure Help needed turning from your  back to your side while in a flat bed without using bedrails?: A Little Help needed moving from lying on your back to sitting on the side of a flat bed without using bedrails?: A Lot Help needed moving to and from a bed to a chair (including a wheelchair)?: A Lot Help needed standing up from a chair using your arms (e.g., wheelchair or bedside chair)?: A Lot Help needed to walk in hospital room?: A Lot Help needed climbing 3-5 steps with a railing? : Total 6 Click Score: 12    End of Session Equipment Utilized During Treatment: Gait belt;Oxygen Activity Tolerance: Patient limited by fatigue Patient left: in bed;with call bell/phone within reach;with bed alarm set;with family/visitor present Nurse Communication: Mobility status (SpO2 readings) PT Visit Diagnosis: Unsteadiness on feet (R26.81);Muscle weakness (generalized) (M62.81);History of falling (Z91.81);Difficulty in walking, not elsewhere classified (R26.2)    Time: 9049-8977 PT Time Calculation (min) (ACUTE ONLY): 32 min   Charges:   PT  Evaluation $PT Eval Moderate Complexity: 1 Mod PT Treatments $Therapeutic Activity: 8-22 mins PT General Charges $$ ACUTE PT VISIT: 1 Visit         Evanna Washinton, SPT

## 2024-09-20 NOTE — ED Notes (Signed)
 Pt not keeping oxygen in her nose.

## 2024-09-21 DIAGNOSIS — R531 Weakness: Secondary | ICD-10-CM

## 2024-09-21 DIAGNOSIS — J9601 Acute respiratory failure with hypoxia: Secondary | ICD-10-CM | POA: Diagnosis not present

## 2024-09-21 DIAGNOSIS — R7989 Other specified abnormal findings of blood chemistry: Secondary | ICD-10-CM

## 2024-09-21 LAB — BASIC METABOLIC PANEL WITH GFR
Anion gap: 9 (ref 5–15)
BUN: 17 mg/dL (ref 8–23)
CO2: 30 mmol/L (ref 22–32)
Calcium: 8.8 mg/dL — ABNORMAL LOW (ref 8.9–10.3)
Chloride: 100 mmol/L (ref 98–111)
Creatinine, Ser: 0.93 mg/dL (ref 0.44–1.00)
GFR, Estimated: 57 mL/min — ABNORMAL LOW (ref >60)
Glucose, Bld: 95 mg/dL (ref 70–99)
Potassium: 3.7 mmol/L (ref 3.5–5.1)
Sodium: 139 mmol/L (ref 135–145)

## 2024-09-21 MED ORDER — HYDRALAZINE HCL 20 MG/ML IJ SOLN: 5.0000 mg | Freq: Four times a day (QID) | INTRAMUSCULAR | Status: DC | PRN

## 2024-09-21 MED ORDER — SODIUM CHLORIDE 0.9 % IV BOLUS
500.0000 mL | Freq: Once | INTRAVENOUS | Status: AC
  Administered 2024-09-21: 500 mL via INTRAVENOUS

## 2024-09-21 MED ORDER — SODIUM CHLORIDE 0.9 % IV SOLN
500.0000 mg | INTRAVENOUS | Status: DC
  Administered 2024-09-21 – 2024-09-22 (×2): 500 mg via INTRAVENOUS
  Filled 2024-09-21 (×3): qty 5

## 2024-09-21 MED ORDER — ENOXAPARIN SODIUM 40 MG/0.4ML IJ SOSY
40.0000 mg | PREFILLED_SYRINGE | INTRAMUSCULAR | Status: DC
  Administered 2024-09-21 – 2024-09-23 (×3): 40 mg via SUBCUTANEOUS
  Filled 2024-09-21 (×3): qty 0.4

## 2024-09-21 MED ORDER — AMPICILLIN-SULBACTAM SODIUM 3 (2-1) G IJ SOLR: 3.0000 g | Freq: Once | INTRAMUSCULAR | Status: DC

## 2024-09-21 MED ORDER — SODIUM CHLORIDE 0.9 % IV SOLN
3.0000 g | Freq: Four times a day (QID) | INTRAVENOUS | Status: DC
  Administered 2024-09-21 – 2024-09-22 (×4): 3 g via INTRAVENOUS
  Filled 2024-09-21 (×6): qty 8

## 2024-09-21 NOTE — Assessment & Plan Note (Signed)
 PT, OT were consulted on 09/19/2024

## 2024-09-21 NOTE — Assessment & Plan Note (Signed)
 At baseline

## 2024-09-21 NOTE — ED Notes (Signed)
 Cox, MD, made aware of BP 95/39 (57)

## 2024-09-21 NOTE — Progress Notes (Signed)
 PHARMACIST - PHYSICIAN COMMUNICATION  CONCERNING:  Enoxaparin (Lovenox) for DVT Prophylaxis   ASSESSMENT: Patient was prescribed enoxaparin 30 mg subcutaneously every 24 hours for VTE prophylaxis.   Body mass index is 32.89 kg/m.  Estimated Creatinine Clearance: 33.8 mL/min (by C-G formula based on SCr of 0.93 mg/dL).  Based on [facility] policy, patient qualifies for enoxaparin dosing of 0.5 mg per kilogram of total body weight every 24 hours because their body mass index is >30 kg/m2.  PLAN: Pharmacy has adjusted enoxaparin dose per [facility] policy.  Description: Patient is now receiving enoxaparin 40 mg subcutaneously every 24 hours.  Will M. Lenon, PharmD, BCPS Clinical Pharmacist 09/21/2024 11:14 AM

## 2024-09-21 NOTE — Assessment & Plan Note (Signed)
 Suspect multifactorial in setting of multifocal pneumonia and heart failure exacerbation Continue with strict I's and O's and IV furosemide  20 mg twice daily Initiate antibiotic with Unasyn per pharmacy and azithromycin  500 mg IV daily to complete a 5-day course Continuous pulse oximetry

## 2024-09-21 NOTE — Progress Notes (Signed)
 PROGRESS NOTE    [name] Griffin  FMW:[id number] DOB: 1928/05/14 DOA: 09/19/2024 PCP: Fernand Fredy RAMAN, MD   No notes on file  Brief Narrative:  Ms. [name] Griffin is a 89 y.o. female with medical history significant of HTN, HLD, diet-controlled DM, gout, CKD-3A, memory loss, hard of hearing, recent left hip fracture, HOH, who presents with SOB.   Patient was admitted for heart failure exacerbation.  Assessment & Plan:   Principal Problem:   Acute respiratory failure with hypoxia (HCC) Active Problems:   Acute on chronic diastolic CHF (congestive heart failure) (HCC)   Idiopathic gout   Chronic kidney disease, stage 3a (HCC)   Elevated troponin   Essential hypertension, benign   Memory impairment   Obesity (BMI 30-39.9)   Assessment and Plan:  * Acute respiratory failure with hypoxia (HCC) Suspect multifactorial in setting of multifocal pneumonia and heart failure exacerbation Continue with strict I's and O's and IV furosemide  20 mg twice daily Initiate antibiotic with Unasyn per pharmacy and azithromycin  500 mg IV daily to complete a 5-day course Continuous pulse oximetry  Acute on chronic diastolic CHF (congestive heart failure) (HCC) Strict I's and O's Continue with furosemide  20 mg IV twice daily  Elevated troponin Secondary to demand ischemia in setting of acute hypoxic respiratory failure requiring O2 supplementation Treat multifocal pneumonia and heart failure exacerbation per above Complete echo on 07/25/2024: Estimated ejection fraction 60 to 65%, grade 1 diastolic dysfunction  Chronic kidney disease, stage 3a (HCC) At baseline  Obesity (BMI 30-39.9) This complicates overall care and prognosis.   Memory impairment At baseline, patient would not know the current day, month, year, or current month. She sometimes will know her name. She would know her son. She sometimes call [name] incorrectly as [name] (who is [name]/Beamon's brother).  Essential hypertension,  benign Home atenolol  25 mg daily, lisinopril  5 mg daily Hydralazine  5 mg IV every 6 hours as needed for SBP greater than 175, 5 days ordered  DVT prophylaxis: Enoxaparin Code Status: DNR/DNI Family Communication: Updated son, Steffan Plaza over the phone at [phone] Disposition Plan:  Level of care: Progressive  Consultants:  Pharmacy  Procedures:  None at this time  Antimicrobials: Unasyn, azithromycin   Subjective:  At bedside, she states that 'I'm alright' and 'I'm fine' to my question: what is your name, how old are you, where are you?  Objective: Vitals:   09/21/24 0645 09/21/24 1230 09/21/24 1245 09/21/24 1529  BP: 124/61   (!) 81/44  Pulse: 66   (!) 55  Resp: 16   18  Temp:  98 F (36.7 C) 97.7 F (36.5 C) 97.8 F (36.6 C)  TempSrc:  Oral Oral Oral  SpO2: 91%   95%  Weight:       No intake or output data in the 24 hours ending 09/21/24 1539 Filed Weights   09/19/24 1828  Weight: 76.4 kg   Examination:  General exam: Appears calm and comfortable.  Pleasantly confused Respiratory system: Clear to auscultation. Respiratory effort normal. Cardiovascular system: S1 & S2 heard, RRR. No JVD, murmurs, rubs, gallops or clicks. No pedal edema. Gastrointestinal system: Abdomen is nondistended, soft and nontender. No organomegaly or masses felt. Normal bowel sounds heard. Central nervous system: Alert and oriented. No focal neurological deficits. Extremities: Symmetric power Skin: No rashes, lesions or ulcers Psychiatry: Lacks judgement and insight appear normal. Mood & affect appropriate.   Data Reviewed: I have personally reviewed following labs and imaging studies  CBC: Recent  Labs  Lab 09/19/24 1519 09/20/24 0218  WBC 10.3 10.8*  HGB 11.8* 11.4*  HCT 38.1 37.3  MCV 96.7 96.4  PLT 235 213   Basic Metabolic Panel: Recent Labs  Lab 09/19/24 1519 09/20/24 0218 09/21/24 0420  NA 142 142 139  K 3.5 3.2* 3.7  CL 104 102 100  CO2 28 30 30    GLUCOSE 126* 94 95  BUN 21 17 17   CREATININE 1.09* 1.06* 0.93  CALCIUM 8.9 8.6* 8.8*  MG  --  1.9  --    GFR: Estimated Creatinine Clearance: 33.8 mL/min (by C-G formula based on SCr of 0.93 mg/dL).  Sepsis Labs: Recent Labs  Lab 09/19/24 1600  LATICACIDVEN 1.2   Recent Results (from the past 240 hours)  Resp panel by RT-PCR (RSV, Flu A&B, Covid) Anterior Nasal Swab     Status: None   Collection Time: 09/19/24  4:05 PM   Specimen: Anterior Nasal Swab  Result Value Ref Range Status   SARS Coronavirus 2 by RT PCR NEGATIVE NEGATIVE Final    Comment: (NOTE) SARS-CoV-2 target nucleic acids are NOT DETECTED.  The SARS-CoV-2 RNA is generally detectable in upper respiratory specimens during the acute phase of infection. The lowest concentration of SARS-CoV-2 viral copies this assay can detect is 138 copies/mL. A negative result does not preclude SARS-Cov-2 infection and should not be used as the sole basis for treatment or other patient management decisions. A negative result may occur with  improper specimen collection/handling, submission of specimen other than nasopharyngeal swab, presence of viral mutation(s) within the areas targeted by this assay, and inadequate number of viral copies(<138 copies/mL). A negative result must be combined with clinical observations, patient history, and epidemiological information. The expected result is Negative.  Fact Sheet for Patients:  BloggerCourse.com  Fact Sheet for Healthcare Providers:  SeriousBroker.it  This test is no t yet approved or cleared by the [place]  FDA and  has been authorized for detection and/or diagnosis of SARS-CoV-2 by FDA under an Emergency Use Authorization (EUA). This EUA will remain  in effect (meaning this test can be used) for the duration of the COVID-19 declaration under Section 564(b)(1) of the Act, 21 U.S.C.section 360bbb-3(b)(1), unless the  authorization is terminated  or revoked sooner.       Influenza A by PCR NEGATIVE NEGATIVE Final   Influenza B by PCR NEGATIVE NEGATIVE Final    Comment: (NOTE) The Xpert Xpress SARS-CoV-2/FLU/RSV plus assay is intended as an aid in the diagnosis of influenza from Nasopharyngeal swab specimens and should not be used as a sole basis for treatment. Nasal washings and aspirates are unacceptable for Xpert Xpress SARS-CoV-2/FLU/RSV testing.  Fact Sheet for Patients: BloggerCourse.com  Fact Sheet for Healthcare Providers: SeriousBroker.it  This test is not yet approved or cleared by the [place]  FDA and has been authorized for detection and/or diagnosis of SARS-CoV-2 by FDA under an Emergency Use Authorization (EUA). This EUA will remain in effect (meaning this test can be used) for the duration of the COVID-19 declaration under Section 564(b)(1) of the Act, 21 U.S.C. section 360bbb-3(b)(1), unless the authorization is terminated or revoked.     Resp Syncytial Virus by PCR NEGATIVE NEGATIVE Final    Comment: (NOTE) Fact Sheet for Patients: BloggerCourse.com  Fact Sheet for Healthcare Providers: SeriousBroker.it  This test is not yet approved or cleared by the [place]  FDA and has been authorized for detection and/or diagnosis of SARS-CoV-2 by FDA under an Emergency Use Authorization (EUA). This  EUA will remain in effect (meaning this test can be used) for the duration of the COVID-19 declaration under Section 564(b)(1) of the Act, 21 U.S.C. section 360bbb-3(b)(1), unless the authorization is terminated or revoked.  Performed at [facility], [address] [place], [place] [address]     Radiology Studies: CT Angio Chest Pulmonary Embolism (PE) W or WO Contrast Result Date: 09/19/2024 CLINICAL DATA:  Pulmonary embolus suspected with high probability.  Shortness of breath. Chronic diastolic congestive heart failure. EXAM: CT ANGIOGRAPHY CHEST WITH CONTRAST TECHNIQUE: Multidetector CT imaging of the chest was performed using the standard protocol during bolus administration of intravenous contrast. Multiplanar CT image reconstructions and MIPs were obtained to evaluate the vascular anatomy. RADIATION DOSE REDUCTION: This exam was performed according to the departmental dose-optimization program which includes automated exposure control, adjustment of the mA and/or kV according to patient size and/or use of iterative reconstruction technique. CONTRAST:  75mL OMNIPAQUE  IOHEXOL  350 MG/ML SOLN COMPARISON:  Chest radiograph 09/19/2024.  CT chest 07/23/2024 FINDINGS: Cardiovascular: Technically adequate study with good opacification of the central and segmental pulmonary arteries. No focal filling defects. No evidence of significant pulmonary embolus. Normal heart size. No pericardial effusions. Normal caliber thoracic aorta. Calcification of the aorta and coronary arteries. Great vessel origins are patent. Mediastinum/Nodes: Large substernal left thyroid gland nodule measuring 3.7 x 4.8 cm in diameter. No change since prior study. No imaging follow-up is indicated based on patient's age. Mediastinal and hilar lymphadenopathy with largest pretracheal lymph nodes measuring up to about 1.6 cm short axis dimension. Lymphadenopathy is unchanged since prior study. Etiology is nonspecific, possibly reactive or neoplastic. Esophagus is decompressed. Small esophageal hiatal hernia. Lungs/Pleura: Small right pleural effusion. Diffuse alveolar and interstitial infiltrates throughout both lungs demonstrating progression since prior study. This is likely multifocal pneumonia or aspiration. Some mucous plugging is demonstrated in the lung bases. No pneumothorax. Upper Abdomen: 1.4 cm right adrenal gland nodule measuring 5 Hounsfield units density and unchanged since prior study. This  is likely a benign fat containing adenoma and no additional imaging follow-up is indicated. Musculoskeletal: Degenerative changes in the spine and shoulders. Old fracture deformity of the proximal right humerus. No acute bony abnormalities. Review of the MIP images confirms the above findings. IMPRESSION: 1. No evidence of significant pulmonary embolus. 2. Diffuse alveolar and interstitial infiltrates throughout both lungs with progression since prior study, likely representing multifocal pneumonia or aspiration. 3. Small right pleural effusion with atelectasis in the right base. 4. Aortic atherosclerosis. Electronically Signed   By: Elsie Gravely M.D.   On: 09/19/2024 21:26   US  Venous Img Lower Bilateral (DVT) Result Date: 09/19/2024 CLINICAL DATA:  220372 Positive D dimer 220372, pain edema and injury. EXAM: BILATERAL LOWER EXTREMITY VENOUS DOPPLER ULTRASOUND TECHNIQUE: Gray-scale sonography with graded compression, as well as color Doppler and duplex ultrasound were performed to evaluate the lower extremity deep venous systems from the level of the common femoral vein and including the common femoral, femoral, profunda femoral, popliteal and calf veins including the posterior tibial, peroneal and gastrocnemius veins when visible. The superficial great saphenous vein was also interrogated. Spectral Doppler was utilized to evaluate flow at rest and with distal augmentation maneuvers in the common femoral, femoral and popliteal veins. COMPARISON:  None Available. FINDINGS: RIGHT LOWER EXTREMITY Common Femoral Vein: No evidence of thrombus. Normal compressibility, respiratory phasicity and response to augmentation. Saphenofemoral Junction: No evidence of thrombus. Normal compressibility and flow on color Doppler imaging. Profunda Femoral Vein: No evidence of thrombus.  Normal compressibility and flow on color Doppler imaging. Femoral Vein: No evidence of thrombus. Normal compressibility, respiratory phasicity  and response to augmentation. Popliteal Vein: No evidence of thrombus. Normal compressibility, respiratory phasicity and response to augmentation. Calf Veins: Limited evaluation of the peroneal vein. No posterior tibial vein thrombus. Superficial Great Saphenous Vein: No evidence of thrombus. Normal compressibility. Venous Reflux:  None. Other Findings:  None. LEFT LOWER EXTREMITY Common Femoral Vein: No evidence of thrombus. Normal compressibility, respiratory phasicity and response to augmentation. Saphenofemoral Junction: No evidence of thrombus. Normal compressibility and flow on color Doppler imaging. Profunda Femoral Vein: No evidence of thrombus. Normal compressibility and flow on color Doppler imaging. Femoral Vein: No evidence of thrombus. Normal compressibility, respiratory phasicity and response to augmentation. Popliteal Vein: No evidence of thrombus. Normal compressibility, respiratory phasicity and response to augmentation. Calf Veins: Limited evaluation. Superficial Great Saphenous Vein: No evidence of thrombus. Normal compressibility. Venous Reflux:  None. Other Findings:  None. IMPRESSION: No evidence of deep venous thrombosis in either lower extremity. Limited evaluation of the right perineal, left posterior tibial, left peroneal veins. Electronically Signed   By: Morgane  Naveau M.D.   On: 09/19/2024 20:45   DG Chest 2 View Result Date: 09/19/2024 CLINICAL DATA:  Shortness of breath, lethargy and weakness EXAM: CHEST - 2 VIEW COMPARISON:  07/23/2024 FINDINGS: Cardiomegaly. Diffuse bilateral interstitial opacity and small layering pleural effusions increased compared to prior examination. Dextroscoliosis thoracic spine. Chronic fracture deformity of the proximal right humerus. Severe left glenohumeral arthrosis. IMPRESSION: Cardiomegaly with diffuse bilateral interstitial opacity and small layering pleural effusions increased compared to prior examination, consistent with worsened edema. No  focal airspace opacity. Electronically Signed   By: Marolyn JONETTA Jaksch M.D.   On: 09/19/2024 16:03   Scheduled Meds:  allopurinol   100 mg Oral Daily   aspirin  EC  325 mg Oral Daily   atenolol   25 mg Oral Daily   cyanocobalamin   1,000 mcg Oral Daily   donepezil   10 mg Oral QHS   enoxaparin (LOVENOX) injection  40 mg Subcutaneous Q24H   furosemide   20 mg Intravenous Q12H   lisinopril   5 mg Oral Daily   Continuous Infusions:  ampicillin-sulbactam (UNASYN) IV Stopped (09/21/24 1315)   azithromycin  Stopped (09/21/24 1315)    LOS: 2 days   Time spent: 37  Dr. Sherre [facility]   If 7PM-7AM, please contact night-coverage  09/21/2024, 3:39 PM

## 2024-09-21 NOTE — Assessment & Plan Note (Signed)
 At baseline, patient would not know the current day, month, year, or current month. She sometimes will know her name. She would know her son. She sometimes call [name] incorrectly as [name] (who is [name]/Beamon's brother).

## 2024-09-21 NOTE — Assessment & Plan Note (Addendum)
 Home atenolol  25 mg daily, lisinopril  5 mg daily Hydralazine  5 mg IV every 6 hours as needed for SBP greater than 175, 5 days ordered

## 2024-09-21 NOTE — Assessment & Plan Note (Signed)
 Secondary to demand ischemia in setting of acute hypoxic respiratory failure requiring O2 supplementation Treat multifocal pneumonia and heart failure exacerbation per above Complete echo on 07/25/2024: Estimated ejection fraction 60 to 65%, grade 1 diastolic dysfunction

## 2024-09-21 NOTE — ED Notes (Addendum)
 This tech and nursing student assisted pt to get on the bedpan. Pt urinated and had a bowel movement at this time. Soiled brief removed and clean brief put on.

## 2024-09-21 NOTE — Assessment & Plan Note (Signed)
 Strict I's and O's Continue with furosemide  20 mg IV twice daily

## 2024-09-21 NOTE — TOC Initial Note (Signed)
 Transition of Care [facility]) - Initial/Assessment Note    Patient Details  Name: [name] Griffin MRN: [id number] Date of Birth: [birth year]  Transition of [facility]) CM/SW Contact:    Lauraine JAYSON Carpen, LCSW Phone Number: 09/21/2024, 12:30 PM  Clinical Narrative:  Patient is active with [facility] for PT and OT. CSW will continue to follow patient for support and facilitate return home once stable.                Expected Discharge Plan: Home w Home Health Services     Patient Goals and CMS Choice            Expected Discharge Plan and Services       Living arrangements for the past 2 months: Single Family Home                           HH Arranged: PT, OT HH Agency: [facility] Date [facility] Agency Contacted: 09/21/24   Representative spoke with at [facility] Agency: Larraine  Prior Living Arrangements/Services Living arrangements for the past 2 months: Single Family Home   Patient language and need for interpreter reviewed:: Yes        Need for Family Participation in Patient Care: Yes (Comment)   Current home services: Home OT, Home PT Criminal Activity/Legal Involvement Pertinent to Current Situation/Hospitalization: No - Comment as needed  Activities of Daily Living      Permission Sought/Granted                  Emotional Assessment       Orientation: : Oriented to Self Alcohol / Substance Use: Not Applicable Psych Involvement: No (comment)  Admission diagnosis:  Acute on chronic diastolic CHF (congestive heart failure) (HCC) [I50.33] Patient Active Problem List   Diagnosis Date Noted   Elevated troponin 09/21/2024   Acute on chronic diastolic CHF (congestive heart failure) (HCC) 09/19/2024   Acute respiratory failure with hypoxia (HCC) 09/19/2024   Combined hyperlipidemia associated with type 2 diabetes mellitus (HCC) 08/28/2024   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) 08/28/2024   History of  recent fall 08/28/2024   Acute blood loss anemia 07/25/2024   Closed left hip fracture (HCC) 07/23/2024   Fall at home, initial encounter 07/23/2024   Type II diabetes mellitus with renal manifestations (HCC) 07/23/2024   Chronic kidney disease, stage 3a (HCC) 07/23/2024   Oxygen desaturation 07/23/2024   Obesity (BMI 30-39.9) 07/23/2024   Elevated CK 07/23/2024   Syncope 07/23/2024   Memory impairment 01/27/2024   Type 2 diabetes mellitus without complication, without long-term current use of insulin (HCC) 07/27/2023   Idiopathic gout 07/27/2023   Essential hypertension, benign 07/27/2023   Stage 2 chronic kidney disease 07/27/2023   Presbycusis of both ears 07/27/2023   Mixed hyperlipidemia 07/27/2023   Closed fracture of proximal end of right humerus 04/20/2017   PCP:  Fernand Fredy RAMAN, MD Pharmacy:   [facility] [phone] - [place], [place] - [address] [place] [address] Phone: [phone] Fax: [phone]     Social Drivers of Health (SDOH) Social History: SDOH Screenings   Food Insecurity: No Food Insecurity (07/24/2024)  Housing: Unknown (08/30/2024)   Received from [facility]  Transportation Needs: Patient Unable To Answer (07/24/2024)  Utilities: Patient Unable To Answer (07/24/2024)  Social Connections: Unknown (07/24/2024)  Tobacco Use: Low Risk  (  09/19/2024)  Recent Concern: Tobacco Use - Medium Risk (08/30/2024)   Received from [facility]   SDOH Interventions:     Readmission Risk Interventions     No data to display

## 2024-09-21 NOTE — Assessment & Plan Note (Signed)
 -  This complicates overall care and prognosis.

## 2024-09-22 DIAGNOSIS — J9601 Acute respiratory failure with hypoxia: Secondary | ICD-10-CM | POA: Diagnosis not present

## 2024-09-22 DIAGNOSIS — E876 Hypokalemia: Secondary | ICD-10-CM | POA: Insufficient documentation

## 2024-09-22 LAB — CBC
HCT: 36.2 % (ref 36.0–46.0)
Hemoglobin: 10.9 g/dL — ABNORMAL LOW (ref 12.0–15.0)
MCH: 29.2 pg (ref 26.0–34.0)
MCHC: 30.1 g/dL (ref 30.0–36.0)
MCV: 97.1 fL (ref 80.0–100.0)
Platelets: 212 K/uL (ref 150–400)
RBC: 3.73 MIL/uL — ABNORMAL LOW (ref 3.87–5.11)
RDW: 14.2 % (ref 11.5–15.5)
WBC: 9.5 K/uL (ref 4.0–10.5)
nRBC: 0 % (ref 0.0–0.2)

## 2024-09-22 LAB — BASIC METABOLIC PANEL WITH GFR
Anion gap: 8 (ref 5–15)
BUN: 23 mg/dL (ref 8–23)
CO2: 32 mmol/L (ref 22–32)
Calcium: 8.7 mg/dL — ABNORMAL LOW (ref 8.9–10.3)
Chloride: 100 mmol/L (ref 98–111)
Creatinine, Ser: 0.94 mg/dL (ref 0.44–1.00)
GFR, Estimated: 56 mL/min — ABNORMAL LOW (ref >60)
Glucose, Bld: 81 mg/dL (ref 70–99)
Potassium: 3.3 mmol/L — ABNORMAL LOW (ref 3.5–5.1)
Sodium: 140 mmol/L (ref 135–145)

## 2024-09-22 MED ORDER — POTASSIUM CHLORIDE ER 10 MEQ PO TBCR
20.0000 meq | EXTENDED_RELEASE_TABLET | Freq: Two times a day (BID) | ORAL | Status: DC
  Administered 2024-09-22: 20 meq via ORAL
  Filled 2024-09-22: qty 2

## 2024-09-22 MED ORDER — SODIUM CHLORIDE 0.9 % IV SOLN
3.0000 g | Freq: Four times a day (QID) | INTRAVENOUS | Status: DC
  Administered 2024-09-22 – 2024-09-24 (×7): 3 g via INTRAVENOUS
  Filled 2024-09-22 (×9): qty 8

## 2024-09-22 MED ORDER — POTASSIUM CHLORIDE CRYS ER 20 MEQ PO TBCR
20.0000 meq | EXTENDED_RELEASE_TABLET | Freq: Two times a day (BID) | ORAL | Status: AC
  Administered 2024-09-22 – 2024-09-23 (×2): 20 meq via ORAL
  Filled 2024-09-22 (×2): qty 1

## 2024-09-22 NOTE — Progress Notes (Signed)
 Heart Failure Navigator Progress Note  Assessed for Heart & Vascular TOC clinic readiness. Patient EF 60-65% with  Grade 1 DD.  Chest x-ray:09/19/2024 Cardiomegaly with diffuse bilateral interstitial opacity and small layering pleural effusions consistent with worsened edema.  Patient has dementia. Patient hearing imparied. Son and duaghter in law at the bedside and live with patient.  Provided them with CHF education.  Due to patient age and dementia status they prefer to continue to see PCP for patient care needs after discharge.  No CHF TOC scheduled at this time.  Navigator available for reassessment of patient and will sign off at this time.  Charmaine Pines, RN, BSN [facility] Heart Failure Navigator Secure Chat Only

## 2024-09-22 NOTE — Progress Notes (Addendum)
   09/22/24 1345  Spiritual Encounters  Type of Visit Initial  Care provided to: Family  Referral source Other (comment) (Witnesses for an AD)  Reason for visit Advance directives  OnCall Visit No   Chaplain assisted colleague who needed 2 witnesses for another patient.  Patient's family was willing to be the 2 witnesses needed for the other patient.  Chaplain engaged the family as they waited for the notary's arrival.  These witnesses spoke about their mother (mother-in-law) as they sharing fond stories and concerns about her health.    Rev. Rana M. Nicholaus, M.Div. Chaplain Resident [facility]

## 2024-09-22 NOTE — Care Management Important Message (Signed)
 Important Message  Patient Details  Name: [name] Griffin MRN: [id number] Date of Birth: [birth year]   Important Message Given:  Yes - Medicare IM     [name] Griffin 09/22/2024, 4:08 PM

## 2024-09-22 NOTE — Plan of Care (Signed)
  Problem: Elimination: Goal: Will not experience complications related to urinary retention Outcome: Progressing   Problem: Pain Managment: Goal: General experience of comfort will improve and/or be controlled Outcome: Progressing   Problem: Education: Goal: Knowledge of General Education information will improve Description: Including pain rating scale, medication(s)/side effects and non-pharmacologic comfort measures Outcome: Not Progressing   Problem: Clinical Measurements: Goal: Cardiovascular complication will be avoided Outcome: Not Progressing   Problem: Activity: Goal: Risk for activity intolerance will decrease Outcome: Not Progressing

## 2024-09-22 NOTE — Progress Notes (Signed)
 PROGRESS NOTE    [name] Griffin  FMW:[id number] DOB: Feb 11, 1928 DOA: 09/19/2024 PCP: [name] Fredy RAMAN, MD   No notes on file  Brief Narrative:   Ms. [name] Griffin. [name] Griffin is a 89 year old female with history of hypertension, hyperlipidemia, diet-controlled diabetes mellitus,, CKD 3A, memory loss, moderate to severe dementia, hard of hearing, history of left hip fracture status post repair, who presents to emergency department on 09/19/2024 for chief concerns of shortness of breath.  Patient was admitted for heart failure exacerbation.  CT a for PE done on 09/19/2024 was read as no evidence of PE.  Diffuse alveolar and interstitial infiltrates throughout both lungs with progression since prior study, likely representing multifocal pneumonia or aspiration.  Aspiration and atypical pneumonia antibiotic coverage was initiated on 09/21/2024.  09/21/24: abx initiated. Pt, ot, toc were consulted.  09/22/24: day 2 of abx.   Assessment & Plan:   Principal Problem:   Acute respiratory failure with hypoxia (HCC) Active Problems:   Acute on chronic diastolic CHF (congestive heart failure) (HCC)   Idiopathic gout   Chronic kidney disease, stage 3a (HCC)   Elevated troponin   Essential hypertension, benign   Memory impairment   Obesity (BMI 30-39.9)   Weakness   Hypokalemia   Assessment and Plan:  * Acute respiratory failure with hypoxia (HCC) Suspect multifactorial in setting of multifocal pneumonia and heart failure exacerbation Continue with strict I's and O's and IV furosemide  20 mg twice daily Initiate antibiotic with Unasyn per pharmacy and azithromycin  500 mg IV daily to complete a 5-day course Continuous pulse oximetry  Acute on chronic diastolic CHF (congestive heart failure) (HCC) Strict I's and O's Continue with furosemide  20 mg IV twice daily  Elevated troponin Secondary to demand ischemia in setting of acute hypoxic respiratory failure requiring O2 supplementation Treat  multifocal pneumonia and heart failure exacerbation per above Complete echo on 07/25/2024: Estimated ejection fraction 60 to 65%, grade 1 diastolic dysfunction  Chronic kidney disease, stage 3a (HCC) At baseline  Obesity (BMI 30-39.9) This complicates overall care and prognosis.   Memory impairment At baseline, patient would not know the current day, month, year, or current month. She sometimes will know her name. She would know her son. She sometimes call [name] Griffin incorrectly as [name] Griffin (who is [name] Griffin/[name] Griffin's brother).  Essential hypertension, benign Home atenolol  25 mg daily, lisinopril  5 mg daily Hydralazine  5 mg IV every 6 hours as needed for SBP greater than 175, 5 days ordered  Hypokalemia Potassium chloride supplementation 20 mEq p.o. twice daily, 3 total doses ordered Recheck BMP in the a.m.  Weakness PT, OT were consulted on 09/19/2024  DVT prophylaxis: Enoxaparin Code Status: DNR/DNI Family Communication: No Disposition Plan:  Level of care: Progressive  Consultants:  Pharmacy, PT, OT, TOC  Procedures:  Not indicated  Antimicrobials: Unasyn, azithromycin    Subjective:  At bedside, patient was able to tell me that her name is [name] Griffin.  She was not able to tell me the current calendar year or the current location.  When asked about where she is, she states ' I am fine'.  She is resting peacefully after that and does not appear to be in acute distress.  Objective: Vitals:   09/22/24 0500 09/22/24 0727 09/22/24 0954 09/22/24 1151  BP:  (!) 103/46 (!) 145/77 (!) 111/53  Pulse:  61 75 70  Resp:      Temp:  97.9 F (36.6 C)  98.5 F (36.9 C)  TempSrc:  Oral  Oral  SpO2:  99%  96%  Weight: 74 kg       Intake/Output Summary (Last 24 hours) at 09/22/2024 1202 Last data filed at 09/22/2024 0730 Gross per 24 hour  Intake --  Output 300 ml  Net -300 ml   Filed Weights   09/19/24 1828 09/22/24 0500  Weight: 76.4 kg 74 kg    Examination:  General exam: Appears calm  and comfortable.  Pleasantly confused Respiratory system: Clear to auscultation. Respiratory effort normal. Cardiovascular system: S1 & S2 heard, RRR. No JVD, murmurs, rubs, gallops or clicks. No pedal edema. Gastrointestinal system: Abdomen is nondistended, soft and nontender. No organomegaly or masses felt. Normal bowel sounds heard. Central nervous system: Alert and oriented. No focal neurological deficits. Extremities: Generalized weakness consistent with age. Skin: No rashes, lesions or ulcers Psychiatry: Lacks judgement and insight appear consistent with dementia. Mood & affect appropriate.   Data Reviewed: I have personally reviewed following labs and imaging studies  CBC: Recent Labs  Lab 09/19/24 1519 09/20/24 0218 09/22/24 0521  WBC 10.3 10.8* 9.5  HGB 11.8* 11.4* 10.9*  HCT 38.1 37.3 36.2  MCV 96.7 96.4 97.1  PLT 235 213 212   Basic Metabolic Panel: Recent Labs  Lab 09/19/24 1519 09/20/24 0218 09/21/24 0420 09/22/24 0521  NA 142 142 139 140  K 3.5 3.2* 3.7 3.3*  CL 104 102 100 100  CO2 28 30 30  32  GLUCOSE 126* 94 95 81  BUN 21 17 17 23   CREATININE 1.09* 1.06* 0.93 0.94  CALCIUM 8.9 8.6* 8.8* 8.7*  MG  --  1.9  --   --    GFR: Estimated Creatinine Clearance: 32.9 mL/min (by C-G formula based on SCr of 0.94 mg/dL).  Recent Labs  Lab 09/19/24 1600  LATICACIDVEN 1.2    Recent Results (from the past 240 hours)  Resp panel by RT-PCR (RSV, Flu A&B, Covid) Anterior Nasal Swab     Status: None   Collection Time: 09/19/24  4:05 PM   Specimen: Anterior Nasal Swab  Result Value Ref Range Status   SARS Coronavirus 2 by RT PCR NEGATIVE NEGATIVE Final    Comment: (NOTE) SARS-CoV-2 target nucleic acids are NOT DETECTED.  The SARS-CoV-2 RNA is generally detectable in upper respiratory specimens during the acute phase of infection. The lowest concentration of SARS-CoV-2 viral copies this assay can detect is 138 copies/mL. A negative result does not preclude  SARS-Cov-2 infection and should not be used as the sole basis for treatment or other patient management decisions. A negative result may occur with  improper specimen collection/handling, submission of specimen other than nasopharyngeal swab, presence of viral mutation(s) within the areas targeted by this assay, and inadequate number of viral copies(<138 copies/mL). A negative result must be combined with clinical observations, patient history, and epidemiological information. The expected result is Negative.  Fact Sheet for Patients:  BloggerCourse.com  Fact Sheet for Healthcare Providers:  SeriousBroker.it  This test is no t yet approved or cleared by the [place]  FDA and  has been authorized for detection and/or diagnosis of SARS-CoV-2 by FDA under an Emergency Use Authorization (EUA). This EUA will remain  in effect (meaning this test can be used) for the duration of the COVID-19 declaration under Section 564(b)(1) of the Act, 21 U.S.C.section 360bbb-3(b)(1), unless the authorization is terminated  or revoked sooner.       Influenza A by PCR NEGATIVE NEGATIVE Final   Influenza B by PCR NEGATIVE NEGATIVE Final    Comment: (NOTE) The  Xpert Xpress SARS-CoV-2/FLU/RSV plus assay is intended as an aid in the diagnosis of influenza from Nasopharyngeal swab specimens and should not be used as a sole basis for treatment. Nasal washings and aspirates are unacceptable for Xpert Xpress SARS-CoV-2/FLU/RSV testing.  Fact Sheet for Patients: BloggerCourse.com  Fact Sheet for Healthcare Providers: SeriousBroker.it  This test is not yet approved or cleared by the [place]  FDA and has been authorized for detection and/or diagnosis of SARS-CoV-2 by FDA under an Emergency Use Authorization (EUA). This EUA will remain in effect (meaning this test can be used) for the duration of  the COVID-19 declaration under Section 564(b)(1) of the Act, 21 U.S.C. section 360bbb-3(b)(1), unless the authorization is terminated or revoked.     Resp Syncytial Virus by PCR NEGATIVE NEGATIVE Final    Comment: (NOTE) Fact Sheet for Patients: BloggerCourse.com  Fact Sheet for Healthcare Providers: SeriousBroker.it  This test is not yet approved or cleared by the [place]  FDA and has been authorized for detection and/or diagnosis of SARS-CoV-2 by FDA under an Emergency Use Authorization (EUA). This EUA will remain in effect (meaning this test can be used) for the duration of the COVID-19 declaration under Section 564(b)(1) of the Act, 21 U.S.C. section 360bbb-3(b)(1), unless the authorization is terminated or revoked.  Performed at [facility], [address], [place], [place] [address]     Radiology Studies: No results found.  Scheduled Meds:  allopurinol   100 mg Oral Daily   aspirin  EC  325 mg Oral Daily   atenolol   25 mg Oral Daily   cyanocobalamin   1,000 mcg Oral Daily   donepezil   10 mg Oral QHS   enoxaparin (LOVENOX) injection  40 mg Subcutaneous Q24H   furosemide   20 mg Intravenous Q12H   lisinopril   5 mg Oral Daily   potassium chloride SA  20 mEq Oral BID   Continuous Infusions:  ampicillin-sulbactam (UNASYN) IV 3 g (09/22/24 0524)   azithromycin  500 mg (09/22/24 1004)    LOS: 3 days   Time spent: 62  Dr. Sherre [facility]  If 7PM-7AM, please contact night-coverage  09/22/2024, 12:02 PM

## 2024-09-22 NOTE — Plan of Care (Signed)

## 2024-09-22 NOTE — Assessment & Plan Note (Addendum)
 Potassium chloride supplementation 20 mEq p.o. twice daily, 3 total doses ordered Recheck BMP in the a.m. Resolved

## 2024-09-23 DIAGNOSIS — N179 Acute kidney failure, unspecified: Secondary | ICD-10-CM

## 2024-09-23 DIAGNOSIS — J9601 Acute respiratory failure with hypoxia: Secondary | ICD-10-CM | POA: Diagnosis not present

## 2024-09-23 LAB — CBC
HCT: 36.1 % (ref 36.0–46.0)
Hemoglobin: 10.9 g/dL — ABNORMAL LOW (ref 12.0–15.0)
MCH: 29.3 pg (ref 26.0–34.0)
MCHC: 30.2 g/dL (ref 30.0–36.0)
MCV: 97 fL (ref 80.0–100.0)
Platelets: 206 K/uL (ref 150–400)
RBC: 3.72 MIL/uL — ABNORMAL LOW (ref 3.87–5.11)
RDW: 14.1 % (ref 11.5–15.5)
WBC: 10.3 K/uL (ref 4.0–10.5)
nRBC: 0 % (ref 0.0–0.2)

## 2024-09-23 LAB — BASIC METABOLIC PANEL WITH GFR
Anion gap: 14 (ref 5–15)
BUN: 22 mg/dL (ref 8–23)
CO2: 29 mmol/L (ref 22–32)
Calcium: 8.6 mg/dL — ABNORMAL LOW (ref 8.9–10.3)
Chloride: 100 mmol/L (ref 98–111)
Creatinine, Ser: 1.21 mg/dL — ABNORMAL HIGH (ref 0.44–1.00)
GFR, Estimated: 42 mL/min — ABNORMAL LOW (ref >60)
Glucose, Bld: 87 mg/dL (ref 70–99)
Potassium: 3.7 mmol/L (ref 3.5–5.1)
Sodium: 143 mmol/L (ref 135–145)

## 2024-09-23 MED ORDER — SODIUM CHLORIDE 0.9 % IV SOLN: INTRAVENOUS | Status: DC

## 2024-09-23 MED ORDER — AZITHROMYCIN 250 MG PO TABS
500.0000 mg | ORAL_TABLET | Freq: Every day | ORAL | Status: DC
  Administered 2024-09-23 – 2024-09-24 (×2): 500 mg via ORAL
  Filled 2024-09-23 (×2): qty 2

## 2024-09-23 NOTE — Hospital Course (Addendum)
 Ms. 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  09/19/2024: Patient was admitted for heart failure exacerbation.  A CTA PE done on the same was read as no evidence of PE.  Diffuse alveolar and interstitial infiltrates throughout both lungs with progression since prior study, likely representing multifocal pneumonia or aspiration.  09/21/2024: Aspiration and atypical pneumonia antibiotic coverage were initiated with Unasyn and azithromycin  IV.  PT, OT, TOC were consulted  09/22/2024: Day 2 of antibiotic.  Appropriate progression.  Patient remains pleasantly confused though patient was able to tell me her name is [name].  09/23/2024: Day 3 of antibiotic therapy.  Appropriate progression.  Mild elevation in serum creatinine, possibly meeting criteria for acute kidney injury.  Sodium chloride  100 mL/h, 1 day was initiated on admission.  Recheck BMP in AM.  Discontinue IV Lasix .  Unasyn will be transitioned to p.o. Augmentin to complete a 5-day course on 09/24/2024.  Continue strict I's and O's.  09/24/2024: Day 4 of antibiotic therapy.  Renal function improved to baseline.  Hypokalemia has been resolved.  Discharged today.  Discussed with son and he is in agreement.

## 2024-09-23 NOTE — Plan of Care (Signed)
  Problem: Clinical Measurements: Goal: Ability to maintain clinical measurements within normal limits will improve Outcome: Progressing Goal: Cardiovascular complication will be avoided Outcome: Progressing   Problem: Elimination: Goal: Will not experience complications related to urinary retention Outcome: Progressing   Problem: Pain Managment: Goal: General experience of comfort will improve and/or be controlled Outcome: Progressing   Problem: Skin Integrity: Goal: Risk for impaired skin integrity will decrease Outcome: Progressing   Problem: Activity: Goal: Risk for activity intolerance will decrease Outcome: Not Progressing   Problem: Education: Goal: Ability to demonstrate management of disease process will improve Outcome: Not Progressing

## 2024-09-23 NOTE — Progress Notes (Signed)
 PROGRESS NOTE    [name] Griffin  FMW:[id number] DOB: 1928-02-18 DOA: 09/19/2024 PCP: Fernand Fredy RAMAN, MD   Ms. 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  09/19/2024: Patient was admitted for heart failure exacerbation.  A CTA PE done on the same was read as no evidence of PE.  Diffuse alveolar and interstitial infiltrates throughout both lungs with progression since prior study, likely representing multifocal pneumonia or aspiration.  09/21/2024: Aspiration and atypical pneumonia antibiotic coverage were initiated with Unasyn and azithromycin  IV.  PT, OT, TOC were consulted  09/22/2024: Day 2 of antibiotic.  Appropriate progression.  Patient remains pleasantly confused though patient was able to tell me her name is [name] Griffin.  09/23/2024: Day 2 of antibiotic therapy.  Appropriate progression.  Mild elevation in serum creatinine, possibly meeting criteria for acute kidney injury.  Sodium chloride  100 mL/h, 1 day was initiated on admission.  Recheck BMP in AM.  Discontinue IV Lasix .  Unasyn will be transitioned to p.o. Augmentin to complete a 5-day course on 09/24/2024.  Continue strict I's and O's.  Assessment & Plan:   Principal Problem:   Acute respiratory failure with hypoxia (HCC) Active Problems:   Acute on chronic diastolic CHF (congestive heart failure) (HCC)   AKI (acute kidney injury)   Idiopathic gout   Chronic kidney disease, stage 3a (HCC)   Elevated troponin   Essential hypertension, benign   Memory impairment   Obesity (BMI 30-39.9)   Weakness   Hypokalemia   Assessment and Plan:  * Acute respiratory failure with hypoxia (HCC) Suspect multifactorial in setting of multifocal pneumonia and heart failure exacerbation Continue  with strict I's and O's and IV furosemide  20 mg twice daily Continue: Unasyn per pharmacy and azithromycin  500 mg IV daily to complete a 5-day course, currently day 2 of ABX Continuous pulse oximetry  AKI (acute kidney injury) Versus increased serum creatinine, versus mild acute kidney injury Suspect secondary to poor p.o. intake Sodium chloride  infusion 100 mL/h, 1 day ordered Recheck BMP in the a.m.  Acute on chronic diastolic CHF (congestive heart failure) (HCC) Strict I's and O's Continue with furosemide  20 mg IV twice daily  Elevated troponin Secondary to demand ischemia in setting of acute hypoxic respiratory failure requiring O2 supplementation Treat multifocal pneumonia and heart failure exacerbation per above Complete echo on 07/25/2024: Estimated ejection fraction 60 to 65%, grade 1 diastolic dysfunction  Chronic kidney disease, stage 3a (HCC) At baseline  Obesity (BMI 30-39.9) This complicates overall care and prognosis.   Memory impairment At baseline, patient would not know the current day, month, year, or current month. She sometimes will know her name. She would know her son. She sometimes call [name] incorrectly as [name] (who is [name]/Beamon's brother).  Essential hypertension, benign Home atenolol  25 mg daily, lisinopril  5 mg daily Hydralazine  5 mg IV every 6 hours as needed for SBP greater than 175, 5 days ordered  Hypokalemia Potassium chloride supplementation 20 mEq p.o. twice daily, 3 total doses ordered Recheck BMP in the a.m.  Weakness PT, OT were consulted on 09/19/2024  DVT prophylaxis: Enoxaparin Code Status: DNR/DNI Family Communication: Updated son at bedside Disposition Plan: Pending clinical course anticipate discharge on 09/24/2024 for or Monday Level of care: Progressive  Consultants:  Pharmacy  Procedures:  No  Antimicrobials: Azithromycin , Unasyn   Subjective:  At bedside, patient was able to tell me her name is [name] Griffin.  She smiles,  and does not appear to be in acute distress.  She appears pleasantly confused consistent with history of dementia.  Objective: Vitals:   09/23/24 0002 09/23/24 0501 09/23/24 0840 09/23/24 1214  BP: (!) 105/52 (!) 114/46 133/65 (!) 112/56  Pulse: 71 70 69 65  Resp: (!) 22 20  18   Temp: 98.5 F (36.9 C) 98.2 F (36.8 C) 98.2 F (36.8 C) 97.7 F (36.5 C)  TempSrc:   Oral Oral  SpO2: 97% 97% 94% 96%  Weight:  67.2 kg      Intake/Output Summary (Last 24 hours) at 09/23/2024 1511 Last data filed at 09/23/2024 1300 Gross per 24 hour  Intake 440 ml  Output 1300 ml  Net -860 ml   Filed Weights   09/19/24 1828 09/22/24 0500 09/23/24 0501  Weight: 76.4 kg 74 kg 67.2 kg   Examination:  General exam: Appears calm and comfortable  Respiratory system: Clear to auscultation. Respiratory effort normal. Cardiovascular system: S1 & S2 heard, RRR. No JVD, murmurs, rubs, gallops or clicks. No pedal edema. Gastrointestinal system: Abdomen is nondistended, soft and nontender. No organomegaly or masses felt. Normal bowel sounds heard. Central nervous system: Alert and oriented. No focal neurological deficits. Extremities: Symmetric 5 x 5 power. Skin: No rashes, lesions or ulcers Psychiatry: Lacks judgement and insight consistent with dementia l. Mood & affect appropriate.   Data Reviewed: I have personally reviewed following labs and imaging studies  CBC: Recent Labs  Lab 09/19/24 1519 09/20/24 0218 09/22/24 0521 09/23/24 0343  WBC 10.3 10.8* 9.5 10.3  HGB 11.8* 11.4* 10.9* 10.9*  HCT 38.1 37.3 36.2 36.1  MCV 96.7 96.4 97.1 97.0  PLT 235 213 212 206   Basic Metabolic Panel: Recent Labs  Lab 09/19/24 1519 09/20/24 0218 09/21/24 0420 09/22/24 0521 09/23/24 0343  NA 142 142 139 140 143  K 3.5 3.2* 3.7 3.3* 3.7  CL 104 102 100 100 100  CO2 28 30 30  32 29  GLUCOSE 126* 94 95 81 87  BUN 21 17 17 23 22   CREATININE 1.09* 1.06* 0.93 0.94 1.21*  CALCIUM 8.9 8.6* 8.8* 8.7* 8.6*   MG  --  1.9  --   --   --    GFR: Estimated Creatinine Clearance: 24.3 mL/min (A) (by C-G formula based on SCr of 1.21 mg/dL (H)).  Sepsis Labs: Recent Labs  Lab 09/19/24 1600  LATICACIDVEN 1.2   Recent Results (from the past 240 hours)  Resp panel by RT-PCR (RSV, Flu A&B, Covid) Anterior Nasal Swab     Status: None   Collection Time: 09/19/24  4:05 PM   Specimen: Anterior Nasal Swab  Result Value Ref Range Status   SARS Coronavirus 2 by RT PCR NEGATIVE NEGATIVE Final    Comment: (NOTE) SARS-CoV-2 target nucleic acids are NOT DETECTED.  The SARS-CoV-2 RNA is generally detectable in upper respiratory specimens during the acute phase of infection. The lowest concentration of SARS-CoV-2 viral copies this assay can detect is 138 copies/mL. A negative result does not preclude SARS-Cov-2 infection and should not be used as the sole basis for treatment or other patient management decisions. A negative result may occur with  improper specimen collection/handling, submission of specimen other than nasopharyngeal swab, presence of viral mutation(s) within the areas targeted by this assay, and inadequate number of viral copies(<138 copies/mL). A negative result must be combined  with clinical observations, patient history, and epidemiological information. The expected result is Negative.  Fact Sheet for Patients:  BloggerCourse.com  Fact Sheet for Healthcare Providers:  SeriousBroker.it  This test is no t yet approved or cleared by the [place]  FDA and  has been authorized for detection and/or diagnosis of SARS-CoV-2 by FDA under an Emergency Use Authorization (EUA). This EUA will remain  in effect (meaning this test can be used) for the duration of the COVID-19 declaration under Section 564(b)(1) of the Act, 21 U.S.C.section 360bbb-3(b)(1), unless the authorization is terminated  or revoked sooner.       Influenza A by PCR  NEGATIVE NEGATIVE Final   Influenza B by PCR NEGATIVE NEGATIVE Final    Comment: (NOTE) The Xpert Xpress SARS-CoV-2/FLU/RSV plus assay is intended as an aid in the diagnosis of influenza from Nasopharyngeal swab specimens and should not be used as a sole basis for treatment. Nasal washings and aspirates are unacceptable for Xpert Xpress SARS-CoV-2/FLU/RSV testing.  Fact Sheet for Patients: BloggerCourse.com  Fact Sheet for Healthcare Providers: SeriousBroker.it  This test is not yet approved or cleared by the [place]  FDA and has been authorized for detection and/or diagnosis of SARS-CoV-2 by FDA under an Emergency Use Authorization (EUA). This EUA will remain in effect (meaning this test can be used) for the duration of the COVID-19 declaration under Section 564(b)(1) of the Act, 21 U.S.C. section 360bbb-3(b)(1), unless the authorization is terminated or revoked.     Resp Syncytial Virus by PCR NEGATIVE NEGATIVE Final    Comment: (NOTE) Fact Sheet for Patients: BloggerCourse.com  Fact Sheet for Healthcare Providers: SeriousBroker.it  This test is not yet approved or cleared by the [place]  FDA and has been authorized for detection and/or diagnosis of SARS-CoV-2 by FDA under an Emergency Use Authorization (EUA). This EUA will remain in effect (meaning this test can be used) for the duration of the COVID-19 declaration under Section 564(b)(1) of the Act, 21 U.S.C. section 360bbb-3(b)(1), unless the authorization is terminated or revoked.  Performed at [facility], [address] [place], [place] [address]     Radiology Studies: No results found.  Scheduled Meds:  allopurinol   100 mg Oral Daily   aspirin  EC  325 mg Oral Daily   atenolol   25 mg Oral Daily   azithromycin   500 mg Oral Daily   cyanocobalamin   1,000 mcg Oral Daily   donepezil   10 mg  Oral QHS   enoxaparin (LOVENOX) injection  40 mg Subcutaneous Q24H   lisinopril   5 mg Oral Daily   Continuous Infusions:  sodium chloride  100 mL/hr at 09/23/24 1054   ampicillin-sulbactam (UNASYN) IV 3 g (09/23/24 1306)    LOS: 4 days   Time spent: 35 minutes  Dr. Sherre [facility] If 7PM-7AM, please contact night-coverage  09/23/2024, 3:11 PM

## 2024-09-23 NOTE — Assessment & Plan Note (Signed)
 Versus increased serum creatinine, versus mild acute kidney injury Suspect secondary to poor p.o. intake Sodium chloride  infusion 100 mL/h, 1 day ordered Recheck BMP in the a.m. 09/24/2024-resolved

## 2024-09-23 NOTE — Progress Notes (Signed)
 Physical Therapy Treatment Patient Details Name: [name] Griffin MRN: [id number] DOB: 1928-07-08 Today's Date: 09/23/2024   History of Present Illness Pt is a 89 y/o F presenting to ED with SOB. Recent hospitalization 8/10-8/18 due to L hip fracture 2/2 syncope, was d/c to [facility]. Workup for acute respiratory failure with hypoxia due to acute on chronic diastolic CHF. PMH significant for HTN, HLD, diet-controlled DM, gout, CKD-3A, memory loss, recent L hip fx s/p ORIF.    PT Comments  Progressed mobility training: pt performed multiple sit<>stands with RW (Min A) and 3 bouts of gait with RW (Min A).  Monitored SPO2 through PT session. Initial resting SPO2 87% on 2 L, 76BPM, standing with 2 L SPO2 83% HR 84bpm, increased to 4L SPO2 94%, decreased to 3 L during gait SPO2 83% HR 74bpm, returned pt back to 2 L after session. Mobiltiy continues to be limited 2/2 fatigue and general weakness.  Continued PT will assist pt towards greater dynamic standing balance, LE strengthening, and activity tolerance to increase safety and  decrease burden of care with functional mobility.     If plan is discharge home, recommend the following: A lot of help with walking and/or transfers;A lot of help with bathing/dressing/bathroom;Assistance with cooking/housework;Direct supervision/assist for medications management;Assist for transportation;Help with stairs or ramp for entrance;Supervision due to cognitive status   Can travel by private vehicle        Equipment Recommendations  None recommended by PT    Recommendations for Other Services       Precautions / Restrictions Precautions Precautions: Fall Recall of Precautions/Restrictions: Intact Restrictions Weight Bearing Restrictions Per Provider Order: No     Mobility  Bed Mobility               General bed mobility comments: NT- pt up in recliner pre/post session.    Transfers Overall transfer level: Needs assistance Equipment used: Rolling  walker (2 wheels) Transfers: Sit to/from Stand Sit to Stand: Min assist           General transfer comment: multiple sit<>stands with recliner right  behind pt.  Pt has trouble weight shifting to step  backwards.    Ambulation/Gait Ambulation/Gait assistance: Min assist Gait Distance (Feet): 5 Feet (3) Assistive device: Rolling walker (2 wheels) Gait Pattern/deviations: Step-to pattern, Trunk flexed, Shuffle, Decreased stride length       General Gait Details: pt able to ambulate forward but had difficulty coming into an upright posture and required increased time to weight shift to step.   Stairs             Wheelchair Mobility     Tilt Bed    Modified Rankin (Stroke Patients Only)       Balance Overall balance assessment: Needs assistance Sitting-balance support: No upper extremity supported Sitting balance-Leahy Scale: Fair Sitting balance - Comments: poor activity tolerance; wanting to rest back into recliner2/2 fatigue.   Standing balance support: Bilateral upper extremity supported, During functional activity, Reliant on assistive device for balance Standing balance-Leahy Scale: Fair Standing balance comment: static standing- heavy UE support, fatigues quickly.                            Communication Communication Communication: Impaired Factors Affecting Communication: Hearing impaired  Cognition Arousal: Alert Behavior During Therapy: WFL for tasks assessed/performed   PT - Cognitive impairments: History of cognitive impairments  PT - Cognition Comments: pleasantly confused, oriented to self only Following commands: Impaired Following commands impaired: Follows one step commands with increased time    Cueing Cueing Techniques: Verbal cues, Tactile cues, Visual cues  Exercises      General Comments General comments (skin integrity, edema, etc.): Monitored SPO2 through PT session.  Initial resting  SPO2 87% on 2 L, 76BPM, standing with 2 L SPO2 83% HR 84bpm,  increased to 4L SPO2 94%, decreased to 3 L during gait SPO2 83% HR 74bpm,  returned pt back to 2 L after session.      Pertinent Vitals/Pain Pain Assessment Pain Assessment: No/denies pain    Home Living                          Prior Function            PT Goals (current goals can now be found in the care plan section) Acute Rehab PT Goals Patient Stated Goal: to go home PT Goal Formulation: With patient/family Time For Goal Achievement: 10/04/24 Potential to Achieve Goals: Fair Progress towards PT goals: Progressing toward goals    Frequency    Min 2X/week      PT Plan      Co-evaluation              AM-PAC PT 6 Clicks Mobility   Outcome Measure  Help needed turning from your back to your side while in a flat bed without using bedrails?: A Little Help needed moving from lying on your back to sitting on the side of a flat bed without using bedrails?: A Lot Help needed moving to and from a bed to a chair (including a wheelchair)?: A Lot Help needed standing up from a chair using your arms (e.g., wheelchair or bedside chair)?: A Lot Help needed to walk in hospital room?: A Lot Help needed climbing 3-5 steps with a railing? : Total 6 Click Score: 12    End of Session Equipment Utilized During Treatment: Gait belt;Oxygen Activity Tolerance: Patient limited by fatigue Patient left: in chair;with chair alarm set;with family/visitor present Nurse Communication: Mobility status PT Visit Diagnosis: Unsteadiness on feet (R26.81);Muscle weakness (generalized) (M62.81);History of falling (Z91.81);Difficulty in walking, not elsewhere classified (R26.2)     Time: 9155-9091 PT Time Calculation (min) (ACUTE ONLY): 24 min  Charges:    $Therapeutic Activity: 23-37 mins PT General Charges $$ ACUTE PT VISIT: 1 Visit                     Harland Irving, PTA  09/23/24, 9:31 AM

## 2024-09-23 NOTE — Plan of Care (Signed)

## 2024-09-24 ENCOUNTER — Other Ambulatory Visit: Payer: Self-pay

## 2024-09-24 DIAGNOSIS — J9601 Acute respiratory failure with hypoxia: Secondary | ICD-10-CM | POA: Diagnosis not present

## 2024-09-24 LAB — CBC
HCT: 33.8 % — ABNORMAL LOW (ref 36.0–46.0)
Hemoglobin: 10.3 g/dL — ABNORMAL LOW (ref 12.0–15.0)
MCH: 29.6 pg (ref 26.0–34.0)
MCHC: 30.5 g/dL (ref 30.0–36.0)
MCV: 97.1 fL (ref 80.0–100.0)
Platelets: 173 K/uL (ref 150–400)
RBC: 3.48 MIL/uL — ABNORMAL LOW (ref 3.87–5.11)
RDW: 14.1 % (ref 11.5–15.5)
WBC: 8.4 K/uL (ref 4.0–10.5)
nRBC: 0 % (ref 0.0–0.2)

## 2024-09-24 LAB — BASIC METABOLIC PANEL WITH GFR
Anion gap: 6 (ref 5–15)
BUN: 15 mg/dL (ref 8–23)
CO2: 31 mmol/L (ref 22–32)
Calcium: 8.3 mg/dL — ABNORMAL LOW (ref 8.9–10.3)
Chloride: 105 mmol/L (ref 98–111)
Creatinine, Ser: 0.89 mg/dL (ref 0.44–1.00)
GFR, Estimated: >60 mL/min (ref >60)
Glucose, Bld: 80 mg/dL (ref 70–99)
Potassium: 3.7 mmol/L (ref 3.5–5.1)
Sodium: 142 mmol/L (ref 135–145)

## 2024-09-24 MED ORDER — FUROSEMIDE 20 MG PO TABS
20.0000 mg | ORAL_TABLET | Freq: Every day | ORAL | 3 refills | Status: DC | PRN
  Filled 2024-09-24: qty 90, 90d supply, fill #0

## 2024-09-24 MED ORDER — ALBUTEROL SULFATE (2.5 MG/3ML) 0.083% IN NEBU
2.5000 mg | INHALATION_SOLUTION | RESPIRATORY_TRACT | 0 refills | Status: DC | PRN
  Filled 2024-09-24: qty 75, 5d supply, fill #0

## 2024-09-24 MED ORDER — DM-GUAIFENESIN ER 30-600 MG PO TB12
1.0000 | ORAL_TABLET | Freq: Two times a day (BID) | ORAL | 0 refills | Status: DC | PRN
  Filled 2024-09-24: qty 20, 10d supply, fill #0

## 2024-09-24 MED ORDER — AMOXICILLIN-POT CLAVULANATE 875-125 MG PO TABS
1.0000 | ORAL_TABLET | Freq: Two times a day (BID) | ORAL | 0 refills | Status: DC
  Filled 2024-09-24: qty 3, 2d supply, fill #0

## 2024-09-24 MED ORDER — AMOXICILLIN-POT CLAVULANATE 875-125 MG PO TABS
1.0000 | ORAL_TABLET | Freq: Two times a day (BID) | ORAL | Status: DC
  Administered 2024-09-24: 1 via ORAL
  Filled 2024-09-24: qty 1

## 2024-09-24 NOTE — TOC Transition Note (Signed)
 Transition of Care [facility]) - Discharge Note   Patient Details  Name: [name] Griffin MRN: [id number] Date of Birth: 12-05-1928  [facility] of [facility] CM/SW Contact:  Victory Jackquline RAMAN, RN Phone Number: 09/24/2024, 2:33 PM   Clinical Narrative:  RNCM, spoke with the patient and son at the bedside. I introduced myself, my role, and explained that discharge planning recommendations would be discussed. PT recommended Home with Home Health/PT/RN/RT. MD made aware of need for Oxygen Rx. Primary RN doing SAT Walk Test.   Spoke with Ada with [employer] and she will have oxygen delivered to the bedside this afternoon. Spoke to Sarah with [facility] and she is restarting patient's services.  Patient has discharge orders for today. No further concerns. RNCM Signing off.    Final next level of care: Home w Home Health Services Barriers to Discharge: Barriers Resolved   Patient Goals and CMS Choice            Discharge Placement                Patient to be transferred to facility by: Son Name of family member notified: GOLDIE (Son) at Bedside Patient and family notified of of transfer: 09/24/24  Discharge Plan and Services Additional resources added to the After Visit Summary for                  DME Arranged: Oxygen DME Agency: [facility] Date DME Agency Contacted: 09/24/24   Representative spoke with at DME Agency: [facility] Arranged: PT, OT HH Agency: [facility] Date [facility] Agency Contacted: 09/21/24   Representative spoke with at [facility] Agency: [place]  Social Drivers of Health (SDOH) Interventions SDOH Screenings   Food Insecurity: No Food Insecurity (09/22/2024)  Housing: Low Risk  (09/22/2024)  Transportation Needs: Patient Declined (09/22/2024)  Utilities: Patient Declined (09/22/2024)  Social Connections: Unknown (09/22/2024)  Tobacco Use: Low Risk  (09/19/2024)  Recent Concern: Tobacco Use - Medium Risk (08/30/2024)   Received from [facility]      Readmission Risk Interventions     No data to display

## 2024-09-24 NOTE — Discharge Summary (Addendum)
 Physician Discharge Summary   Patient: [name] Griffin MRN: [id number] DOB: 01/25/1928  Admit date:     09/19/2024  Discharge date: 09/24/24  Discharge Physician: Greig SAILOR [name]   PCP: Fernand Fredy RAMAN, MD   Recommendations at discharge:   Complete Augmentin 875-125 mg, 3 more doses ordered on discharge to complete a total 5-day course of antibiotic for aspiration pneumonia.  Atypical coverage has been completed, 3 days total with azithromycin  while inpatient. Had acute kidney injury while in the hospital, home furosemide  has been changed to as needed for swelling and weight gain. Home health PT, RT, RN and aide have been ordered family can use as needed Oxygen supplementation as needed with ambulation and exertion, 3 L nasal cannula to maintain SpO2 greater than 88%.  DME oxygen ordered on discharge  Discharge Diagnoses: Principal Problem:   Acute respiratory failure with hypoxia (HCC) Active Problems:   Acute on chronic diastolic CHF (congestive heart failure) (HCC)   AKI (acute kidney injury)   Idiopathic gout   Chronic kidney disease, stage 3a (HCC)   Elevated troponin   Essential hypertension, benign   Memory impairment   Obesity (BMI 30-39.9)   Weakness   Hypokalemia  Resolved Problems:   Myocardial injury  Hospital Course: [name] Griffin is a 89 year old female with history of hypertension, hyperlipidemia, diet-controlled diabetes mellitus, CKD stage IIIa, memory loss, moderate to severe dementia, hard of hearing, history of left hip fracture status post repair, who presented for chief concerns of shortness of breath.  09/19/2024: Patient was admitted for heart failure exacerbation.  A CTA PE done on the same was read as no evidence of PE.  Diffuse alveolar and interstitial infiltrates throughout both lungs with progression since prior study, likely representing multifocal pneumonia or aspiration.  09/21/2024: Aspiration and atypical pneumonia antibiotic coverage were initiated  with Unasyn and azithromycin  IV.  PT, OT, TOC were consulted  09/22/2024: Day 2 of antibiotic.  Appropriate progression.  Patient remains pleasantly confused though patient was able to tell me her name is [name] Griffin.  09/23/2024: Day 3 of antibiotic therapy.  Appropriate progression.  Mild elevation in serum creatinine, possibly meeting criteria for acute kidney injury.  Sodium chloride  100 mL/h, 1 day was initiated on admission.  Recheck BMP in AM.  Discontinue IV Lasix .  Unasyn will be transitioned to p.o. Augmentin to complete a 5-day course on 09/24/2024.  Continue strict I's and O's.  09/24/2024: Day 4 of antibiotic therapy.  Renal function improved to baseline.  Hypokalemia has been resolved.  Discharged today.  Discussed with son and he is in agreement.  Assessment and Plan:  * Acute respiratory failure with hypoxia (HCC) Suspect multifactorial in setting of multifocal pneumonia and heart failure exacerbation Continue with strict I's and O's and IV furosemide  20 mg twice daily Continue: Unasyn per pharmacy and azithromycin  500 mg IV daily to complete a 5-day course, currently day 2 of ABX Continuous pulse oximetry  AKI (acute kidney injury) Versus increased serum creatinine, versus mild acute kidney injury Suspect secondary to poor p.o. intake Sodium chloride  infusion 100 mL/h, 1 day ordered Recheck BMP in the a.m. 09/24/2024-resolved  Acute on chronic diastolic CHF (congestive heart failure) (HCC) Strict I's and O's Continue with furosemide  20 mg IV twice daily  Elevated troponin Secondary to demand ischemia in setting of acute hypoxic respiratory failure requiring O2 supplementation Treat multifocal pneumonia and heart failure exacerbation per above Complete echo on 07/25/2024: Estimated ejection fraction 60 to 65%, grade 1  diastolic dysfunction  Chronic kidney disease, stage 3a (HCC) At baseline  Obesity (BMI 30-39.9) This complicates overall care and prognosis.   Memory  impairment At baseline, patient would not know the current day, month, year, or current month. She sometimes will know her name. She would know her son. She sometimes call [name] incorrectly as [name] (who is [name]/Beamon's brother).  Essential hypertension, benign Home atenolol  25 mg daily, lisinopril  5 mg daily Hydralazine  5 mg IV every 6 hours as needed for SBP greater than 175, 5 days ordered  Hypokalemia Potassium chloride supplementation 20 mEq p.o. twice daily, 3 total doses ordered Recheck BMP in the a.m. Resolved  Weakness PT, OT were consulted on 09/19/2024  Pain control - [place]  Controlled Substance Reporting System database was reviewed. and patient was instructed, not to drive, operate heavy machinery, perform activities at heights, swimming or participation in water activities or provide baby-sitting services while on Pain, Sleep and Anxiety Medications; until their outpatient Physician has advised to do so again. Also recommended to not to take more than prescribed Pain, Sleep and Anxiety Medications.  Consultants: PT, OT, TOC Procedures performed: None indicated Disposition: Relative's home Diet recommendation:  Cardiac diet DISCHARGE MEDICATION: Allergies as of 09/24/2024   No Known Allergies      Medication List     TAKE these medications    acetaminophen  325 MG tablet Commonly known as: TYLENOL  Take 2 tablets (650 mg total) by mouth every 6 (six) hours as needed for mild pain (pain score 1-3) or fever.   albuterol  (2.5 MG/3ML) 0.083% nebulizer solution Commonly known as: PROVENTIL  Take 3 mLs (2.5 mg total) by nebulization every 4 (four) hours as needed for wheezing or shortness of breath.   allopurinol  100 MG tablet Commonly known as: ZYLOPRIM  TAKE 1 TABLET BY MOUTH EVERY DAY   amoxicillin-clavulanate 875-125 MG tablet Commonly known as: AUGMENTIN Take 1 tablet by mouth every 12 (twelve) hours.   aspirin  EC 325 MG tablet Take 1 tablet (325 mg total)  by mouth daily.   atenolol  25 MG tablet Commonly known as: TENORMIN  TAKE 1 TABLET BY MOUTH EVERY DAY   cyanocobalamin  1000 MCG tablet Take 1 tablet (1,000 mcg total) by mouth daily.   donepezil  10 MG tablet Commonly known as: Aricept  Take 1 tablet (10 mg total) by mouth at bedtime.   FT Mucus Relief DM 30-600 MG Tb12 Take 1 tablet by mouth 2 (two) times daily as needed for cough.   furosemide  20 MG tablet Commonly known as: LASIX  Take 1 tablet (20 mg total) by mouth daily as needed for edema or fluid. What changed:  when to take this reasons to take this   lisinopril  5 MG tablet Commonly known as: ZESTRIL  TAKE 1 TABLET BY MOUTH EVERY DAY   senna-docusate 8.6-50 MG tablet Commonly known as: Senokot-S Take 1 tablet by mouth at bedtime as needed for mild constipation.               Durable Medical Equipment  (From admission, onward)           Start     Ordered   09/24/24 1428  For home use only DME oxygen  Once       Question Answer Comment  Length of Need 6 Months   Mode or (Route) Nasal cannula   Liters per Minute 3   Frequency Continuous (stationary and portable oxygen unit needed)   Oxygen delivery system Gas      09/24/24 1428  09/24/24 0000  For home use only DME Nebulizer machine       Question Answer Comment  Patient needs a nebulizer to treat with the following condition Shortness of breath   Length of Need 6 Months   Additional equipment included Administration kit   Additional equipment included Filter      09/24/24 1211           Discharge Exam: Filed Weights   09/22/24 0500 09/23/24 0501 09/24/24 0500  Weight: 74 kg 67.2 kg 70.9 kg   Physical Exam Constitutional:      General: She is not in acute distress.    Appearance: Normal appearance. She is not ill-appearing, toxic-appearing or diaphoretic.  HENT:     Head: Normocephalic and atraumatic.     Nose: Nose normal.     Mouth/Throat:     Mouth: Mucous membranes are moist.   Eyes:     Conjunctiva/sclera: Conjunctivae normal.     Pupils: Pupils are equal, round, and reactive to light.  Cardiovascular:     Rate and Rhythm: Normal rate and regular rhythm.     Pulses: Normal pulses.  Pulmonary:     Effort: Pulmonary effort is normal.     Breath sounds: Normal breath sounds.  Abdominal:     General: Bowel sounds are normal.     Palpations: Abdomen is soft.  Musculoskeletal:        General: Normal range of motion.     Cervical back: Normal range of motion and neck supple.  Skin:    General: Skin is warm and dry.     Capillary Refill: Capillary refill takes less than 2 seconds.  Neurological:     Mental Status: She is alert. Mental status is at baseline.  Psychiatric:        Mood and Affect: Mood normal.    Condition at discharge: good  The results of significant diagnostics from this hospitalization (including imaging, microbiology, ancillary and laboratory) are listed below for reference.   Imaging Studies: CT Angio Chest Pulmonary Embolism (PE) W or WO Contrast Result Date: 09/19/2024 CLINICAL DATA:  Pulmonary embolus suspected with high probability. Shortness of breath. Chronic diastolic congestive heart failure. EXAM: CT ANGIOGRAPHY CHEST WITH CONTRAST TECHNIQUE: Multidetector CT imaging of the chest was performed using the standard protocol during bolus administration of intravenous contrast. Multiplanar CT image reconstructions and MIPs were obtained to evaluate the vascular anatomy. RADIATION DOSE REDUCTION: This exam was performed according to the departmental dose-optimization program which includes automated exposure control, adjustment of the mA and/or kV according to patient size and/or use of iterative reconstruction technique. CONTRAST:  75mL OMNIPAQUE  IOHEXOL  350 MG/ML SOLN COMPARISON:  Chest radiograph 09/19/2024.  CT chest 07/23/2024 FINDINGS: Cardiovascular: Technically adequate study with good opacification of the central and segmental  pulmonary arteries. No focal filling defects. No evidence of significant pulmonary embolus. Normal heart size. No pericardial effusions. Normal caliber thoracic aorta. Calcification of the aorta and coronary arteries. Great vessel origins are patent. Mediastinum/Nodes: Large substernal left thyroid gland nodule measuring 3.7 x 4.8 cm in diameter. No change since prior study. No imaging follow-up is indicated based on patient's age. Mediastinal and hilar lymphadenopathy with largest pretracheal lymph nodes measuring up to about 1.6 cm short axis dimension. Lymphadenopathy is unchanged since prior study. Etiology is nonspecific, possibly reactive or neoplastic. Esophagus is decompressed. Small esophageal hiatal hernia. Lungs/Pleura: Small right pleural effusion. Diffuse alveolar and interstitial infiltrates throughout both lungs demonstrating progression since prior study. This is likely  multifocal pneumonia or aspiration. Some mucous plugging is demonstrated in the lung bases. No pneumothorax. Upper Abdomen: 1.4 cm right adrenal gland nodule measuring 5 Hounsfield units density and unchanged since prior study. This is likely a benign fat containing adenoma and no additional imaging follow-up is indicated. Musculoskeletal: Degenerative changes in the spine and shoulders. Old fracture deformity of the proximal right humerus. No acute bony abnormalities. Review of the MIP images confirms the above findings. IMPRESSION: 1. No evidence of significant pulmonary embolus. 2. Diffuse alveolar and interstitial infiltrates throughout both lungs with progression since prior study, likely representing multifocal pneumonia or aspiration. 3. Small right pleural effusion with atelectasis in the right base. 4. Aortic atherosclerosis. Electronically Signed   By: Elsie Gravely M.D.   On: 09/19/2024 21:26   US  Venous Img Lower Bilateral (DVT) Result Date: 09/19/2024 CLINICAL DATA:  220372 Positive D dimer 220372, pain edema and  injury. EXAM: BILATERAL LOWER EXTREMITY VENOUS DOPPLER ULTRASOUND TECHNIQUE: Gray-scale sonography with graded compression, as well as color Doppler and duplex ultrasound were performed to evaluate the lower extremity deep venous systems from the level of the common femoral vein and including the common femoral, femoral, profunda femoral, popliteal and calf veins including the posterior tibial, peroneal and gastrocnemius veins when visible. The superficial great saphenous vein was also interrogated. Spectral Doppler was utilized to evaluate flow at rest and with distal augmentation maneuvers in the common femoral, femoral and popliteal veins. COMPARISON:  None Available. FINDINGS: RIGHT LOWER EXTREMITY Common Femoral Vein: No evidence of thrombus. Normal compressibility, respiratory phasicity and response to augmentation. Saphenofemoral Junction: No evidence of thrombus. Normal compressibility and flow on color Doppler imaging. Profunda Femoral Vein: No evidence of thrombus. Normal compressibility and flow on color Doppler imaging. Femoral Vein: No evidence of thrombus. Normal compressibility, respiratory phasicity and response to augmentation. Popliteal Vein: No evidence of thrombus. Normal compressibility, respiratory phasicity and response to augmentation. Calf Veins: Limited evaluation of the peroneal vein. No posterior tibial vein thrombus. Superficial Great Saphenous Vein: No evidence of thrombus. Normal compressibility. Venous Reflux:  None. Other Findings:  None. LEFT LOWER EXTREMITY Common Femoral Vein: No evidence of thrombus. Normal compressibility, respiratory phasicity and response to augmentation. Saphenofemoral Junction: No evidence of thrombus. Normal compressibility and flow on color Doppler imaging. Profunda Femoral Vein: No evidence of thrombus. Normal compressibility and flow on color Doppler imaging. Femoral Vein: No evidence of thrombus. Normal compressibility, respiratory phasicity and  response to augmentation. Popliteal Vein: No evidence of thrombus. Normal compressibility, respiratory phasicity and response to augmentation. Calf Veins: Limited evaluation. Superficial Great Saphenous Vein: No evidence of thrombus. Normal compressibility. Venous Reflux:  None. Other Findings:  None. IMPRESSION: No evidence of deep venous thrombosis in either lower extremity. Limited evaluation of the right perineal, left posterior tibial, left peroneal veins. Electronically Signed   By: Morgane  Naveau M.D.   On: 09/19/2024 20:45   DG Chest 2 View Result Date: 09/19/2024 CLINICAL DATA:  Shortness of breath, lethargy and weakness EXAM: CHEST - 2 VIEW COMPARISON:  07/23/2024 FINDINGS: Cardiomegaly. Diffuse bilateral interstitial opacity and small layering pleural effusions increased compared to prior examination. Dextroscoliosis thoracic spine. Chronic fracture deformity of the proximal right humerus. Severe left glenohumeral arthrosis. IMPRESSION: Cardiomegaly with diffuse bilateral interstitial opacity and small layering pleural effusions increased compared to prior examination, consistent with worsened edema. No focal airspace opacity. Electronically Signed   By: Marolyn JONETTA Jaksch M.D.   On: 09/19/2024 16:03   Microbiology: Results for orders placed or performed during  the hospital encounter of 09/19/24  Resp panel by RT-PCR (RSV, Flu A&B, Covid) Anterior Nasal Swab     Status: None   Collection Time: 09/19/24  4:05 PM   Specimen: Anterior Nasal Swab  Result Value Ref Range Status   SARS Coronavirus 2 by RT PCR NEGATIVE NEGATIVE Final    Comment: (NOTE) SARS-CoV-2 target nucleic acids are NOT DETECTED.  The SARS-CoV-2 RNA is generally detectable in upper respiratory specimens during the acute phase of infection. The lowest concentration of SARS-CoV-2 viral copies this assay can detect is 138 copies/mL. A negative result does not preclude SARS-Cov-2 infection and should not be used as the sole basis  for treatment or other patient management decisions. A negative result may occur with  improper specimen collection/handling, submission of specimen other than nasopharyngeal swab, presence of viral mutation(s) within the areas targeted by this assay, and inadequate number of viral copies(<138 copies/mL). A negative result must be combined with clinical observations, patient history, and epidemiological information. The expected result is Negative.  Fact Sheet for Patients:  BloggerCourse.com  Fact Sheet for Healthcare Providers:  SeriousBroker.it  This test is no t yet approved or cleared by the [place]  FDA and  has been authorized for detection and/or diagnosis of SARS-CoV-2 by FDA under an Emergency Use Authorization (EUA). This EUA will remain  in effect (meaning this test can be used) for the duration of the COVID-19 declaration under Section 564(b)(1) of the Act, 21 U.S.C.section 360bbb-3(b)(1), unless the authorization is terminated  or revoked sooner.       Influenza A by PCR NEGATIVE NEGATIVE Final   Influenza B by PCR NEGATIVE NEGATIVE Final    Comment: (NOTE) The Xpert Xpress SARS-CoV-2/FLU/RSV plus assay is intended as an aid in the diagnosis of influenza from Nasopharyngeal swab specimens and should not be used as a sole basis for treatment. Nasal washings and aspirates are unacceptable for Xpert Xpress SARS-CoV-2/FLU/RSV testing.  Fact Sheet for Patients: BloggerCourse.com  Fact Sheet for Healthcare Providers: SeriousBroker.it  This test is not yet approved or cleared by the [place]  FDA and has been authorized for detection and/or diagnosis of SARS-CoV-2 by FDA under an Emergency Use Authorization (EUA). This EUA will remain in effect (meaning this test can be used) for the duration of the COVID-19 declaration under Section 564(b)(1) of the Act, 21  U.S.C. section 360bbb-3(b)(1), unless the authorization is terminated or revoked.     Resp Syncytial Virus by PCR NEGATIVE NEGATIVE Final    Comment: (NOTE) Fact Sheet for Patients: BloggerCourse.com  Fact Sheet for Healthcare Providers: SeriousBroker.it  This test is not yet approved or cleared by the [place]  FDA and has been authorized for detection and/or diagnosis of SARS-CoV-2 by FDA under an Emergency Use Authorization (EUA). This EUA will remain in effect (meaning this test can be used) for the duration of the COVID-19 declaration under Section 564(b)(1) of the Act, 21 U.S.C. section 360bbb-3(b)(1), unless the authorization is terminated or revoked.  Performed at [facility], [address] [place], [place] [address]    Labs: CBC: Recent Labs  Lab 09/19/24 1519 09/20/24 0218 09/22/24 0521 09/23/24 0343 09/24/24 0321  WBC 10.3 10.8* 9.5 10.3 8.4  HGB 11.8* 11.4* 10.9* 10.9* 10.3*  HCT 38.1 37.3 36.2 36.1 33.8*  MCV 96.7 96.4 97.1 97.0 97.1  PLT 235 213 212 206 173   Basic Metabolic Panel: Recent Labs  Lab 09/20/24 0218 09/21/24 0420 09/22/24 0521 09/23/24 0343 09/24/24 0321  NA 142  139 140 143 142  K 3.2* 3.7 3.3* 3.7 3.7  CL 102 100 100 100 105  CO2 30 30 32 29 31  GLUCOSE 94 95 81 87 80  BUN 17 17 23 22 15   CREATININE 1.06* 0.93 0.94 1.21* 0.89  CALCIUM 8.6* 8.8* 8.7* 8.6* 8.3*  MG 1.9  --   --   --   --    Discharge time spent: less than 30 minutes.  Signed: Dr. Sherre [facility] 09/24/2024

## 2024-09-24 NOTE — Plan of Care (Signed)

## 2024-09-24 NOTE — Progress Notes (Signed)
 On RA at rest sats are 88% On RA while pt ambulating sats are 77% And on 3L  of oxygen pt sats at 98% while ambulating

## 2024-09-25 ENCOUNTER — Telehealth: Payer: Self-pay | Admitting: *Deleted

## 2024-09-25 NOTE — Transitions of Care (Post Inpatient/ED Visit) (Signed)
 09/25/2024  Name: [name] Griffin MRN: [id number] DOB: 08-Oct-1928  Today's TOC FU Call Status: Today's TOC FU Call Status:: Successful TOC FU Call Completed TOC FU Call Complete Date: 09/25/24 Patient's Name and Date of Birth confirmed.  Transition Care Management Follow-up Telephone Call Date of Discharge: 09/24/24 Discharge Facility: [facility]) Type of Discharge: Inpatient Admission Primary Inpatient Discharge Diagnosis:: Acute respiratory failure with hypoxia How have you been since you were released from the hospital?: Better Any questions or concerns?: Yes Patient Questions/Concerns:: The Dr changes the lasix  to as needed. How can I tell when she needs it. Patient Questions/Concerns Addressed: Other: (RN discussed where to look for the edema. The best way would be to get a scale and weigh her. If she has gained 2 pounds in 1 day give her the lasix .)  Items Reviewed: Did you receive and understand the discharge instructions provided?: Yes Medications obtained,verified, and reconciled?: Yes (Medications Reviewed) Any new allergies since your discharge?: No Dietary orders reviewed?: No Do you have support at home?: Yes People in Home [RPT]: child(ren), adult Name of Support/Comfort Primary Source: [place] son  Medications Reviewed Today: Medications Reviewed Today     Reviewed by Kennieth Cathlean DEL, RN (Case Manager) on 09/25/24 at 1449  Med List Status: <None>   Medication Order Taking? Sig Documenting Provider Last Dose Status Informant  acetaminophen  (TYLENOL ) 325 MG tablet [id number] Yes Take 2 tablets (650 mg total) by mouth every 6 (six) hours as needed for mild pain (pain score 1-3) or fever. Charlene Debby BROCKS, PA-C  Active Child  albuterol  (PROVENTIL ) (2.5 MG/3ML) 0.083% nebulizer solution [id number] Yes Take 3 mLs (2.5 mg total) by nebulization every 4 (four) hours as needed for wheezing or shortness of breath. Cox, Amy N, DO  Active    allopurinol  (ZYLOPRIM ) 100 MG tablet [id number] Yes TAKE 1 TABLET BY MOUTH EVERY DAY Fernand Fredy RAMAN, MD  Active Child  amoxicillin-clavulanate (AUGMENTIN) 875-125 MG tablet [id number] Yes Take 1 tablet by mouth every 12 (twelve) hours. Cox, Amy N, DO  Active   aspirin  EC 325 MG tablet [id number] Yes Take 1 tablet (325 mg total) by mouth daily. Charlene Debby BROCKS, PA-C  Active Child  atenolol  (TENORMIN ) 25 MG tablet [id number] Yes TAKE 1 TABLET BY MOUTH EVERY DAY Fernand Fredy RAMAN, MD  Active Child  cyanocobalamin  1000 MCG tablet [id number] Yes Take 1 tablet (1,000 mcg total) by mouth daily. Jens Durand, MD  Active Child  dextromethorphan-guaiFENesin  (MUCINEX  DM) 30-600 MG 12hr tablet [id number] Yes Take 1 tablet by mouth 2 (two) times daily as needed for cough. Cox, Amy N, DO  Active   donepezil  (ARICEPT ) 10 MG tablet [id number] Yes Take 1 tablet (10 mg total) by mouth at bedtime. Fernand Fredy RAMAN, MD  Active Child  furosemide  (LASIX ) 20 MG tablet [id number] Yes Take 1 tablet (20 mg total) by mouth daily as needed for edema or fluid. Cox, Amy N, DO  Active   lisinopril  (ZESTRIL ) 5 MG tablet [id number] Yes TAKE 1 TABLET BY MOUTH EVERY DAY Fernand Fredy RAMAN, MD  Active Child  senna-docusate (SENOKOT-S) 8.6-50 MG tablet [id number] Yes Take 1 tablet by mouth at bedtime as needed for mild constipation. Jens Durand, MD  Active Child            Home Care and Equipment/Supplies: Were Home Health Services Ordered?: Yes Name of Home Health Agency:: [facility] Has Agency set up a time to come to your home?: No EMR reviewed for  Home Health Orders: Orders present/patient has not received call (refer to CM for follow-up) Any new equipment or medical supplies ordered?: Yes Name of Medical supply agency?: [facility] Were you able to get the equipment/medical supplies?: Yes Do you have any questions related to the use of the equipment/supplies?: No  Functional Questionnaire: Do you need assistance with bathing/showering or  dressing?: Yes Do you need assistance with meal preparation?: Yes Do you need assistance with eating?: No Do you have difficulty maintaining continence: Yes Do you need assistance with getting out of bed/getting out of a chair/moving?: Yes Do you have difficulty managing or taking your medications?: Yes  Follow up appointments reviewed: PCP Follow-up appointment confirmed?: No MD Provider Line Number:[phone] Given: Yes (Son will call and schedule appt) Specialist Hospital Follow-up appointment confirmed?: NA Do you need transportation to your follow-up appointment?: No Do you understand care options if your condition(s) worsen?: Yes-patient verbalized understanding  SDOH Interventions Today    Flowsheet Row Most Recent Value  SDOH Interventions   Food Insecurity Interventions Intervention Not Indicated  Housing Interventions Intervention Not Indicated  Transportation Interventions Patient Resources (Friends/Family), Intervention Not Indicated  Utilities Interventions Intervention Not Indicated    Goals Addressed             This Visit's Progress    VBCI Transitions of Care (TOC) Care Plan       Problems:  Recent Hospitalization for treatment of Acute respiratory failure with hypoxia Knowledge Deficit Related to Acute respiratory failure with hypoxia/ pneumonia  Goal:  Over the next 30 days, the patient will not experience hospital readmission  Interventions:  Transitions of Care: Doctor Visits  - discussed the importance of doctor visits Referral to Longitudinal Nurse Case Manager for Ongoing follow-up  Patient Self Care Activities:  Attend all scheduled provider appointments Call pharmacy for medication refills 3-7 days in advance of running out of medications Call provider office for new concerns or questions  Notify RN Care Manager of TOC call rescheduling needs Participate in Transition of Care Program/Attend TOC scheduled calls Take medications as prescribed    Monitor daily weights Monitor for edema Monitor oxygen saturations  Plan:  An initial telephone outreach has been scheduled for: 10/03/2024 Next PCP appointment scheduled for: Patient son will call for appointment Telephone follow up appointment with care management team member scheduled for:  Medford Balboa 10/03/2024 2:00       Discussed and offered 30 day TOC program.  Patient  caregiver accepted.  The patient has been provided with contact information for the care management team and has been advised to call with any health -related questions or concerns.  The patient verbalized understanding with current plan of care.  The patient is directed to their insurance card regarding availability of benefits coverage.  Cathlean Headland BSN RN [facility] Care Management Coordinator [email or web address] Direct Dial: [phone]  Fax: [phone] Website: [email or web address]

## 2024-09-26 DIAGNOSIS — M1030 Gout due to renal impairment, unspecified site: Secondary | ICD-10-CM | POA: Diagnosis not present

## 2024-09-26 DIAGNOSIS — E1122 Type 2 diabetes mellitus with diabetic chronic kidney disease: Secondary | ICD-10-CM | POA: Diagnosis not present

## 2024-09-26 DIAGNOSIS — N1831 Chronic kidney disease, stage 3a: Secondary | ICD-10-CM | POA: Diagnosis not present

## 2024-09-26 DIAGNOSIS — D62 Acute posthemorrhagic anemia: Secondary | ICD-10-CM | POA: Diagnosis not present

## 2024-09-26 DIAGNOSIS — I071 Rheumatic tricuspid insufficiency: Secondary | ICD-10-CM | POA: Diagnosis not present

## 2024-09-26 DIAGNOSIS — E785 Hyperlipidemia, unspecified: Secondary | ICD-10-CM | POA: Diagnosis not present

## 2024-09-26 DIAGNOSIS — I10 Essential (primary) hypertension: Secondary | ICD-10-CM | POA: Diagnosis not present

## 2024-09-26 DIAGNOSIS — S72142D Displaced intertrochanteric fracture of left femur, subsequent encounter for closed fracture with routine healing: Secondary | ICD-10-CM | POA: Diagnosis not present

## 2024-09-26 DIAGNOSIS — I700 Atherosclerosis of aorta: Secondary | ICD-10-CM | POA: Diagnosis not present

## 2024-09-30 ENCOUNTER — Other Ambulatory Visit: Payer: Self-pay | Admitting: Internal Medicine

## 2024-09-30 DIAGNOSIS — R413 Other amnesia: Secondary | ICD-10-CM

## 2024-10-03 ENCOUNTER — Other Ambulatory Visit: Payer: Self-pay

## 2024-10-03 DIAGNOSIS — I10 Essential (primary) hypertension: Secondary | ICD-10-CM | POA: Diagnosis not present

## 2024-10-03 DIAGNOSIS — I700 Atherosclerosis of aorta: Secondary | ICD-10-CM | POA: Diagnosis not present

## 2024-10-03 DIAGNOSIS — M1030 Gout due to renal impairment, unspecified site: Secondary | ICD-10-CM | POA: Diagnosis not present

## 2024-10-03 DIAGNOSIS — I071 Rheumatic tricuspid insufficiency: Secondary | ICD-10-CM | POA: Diagnosis not present

## 2024-10-03 DIAGNOSIS — N1831 Chronic kidney disease, stage 3a: Secondary | ICD-10-CM | POA: Diagnosis not present

## 2024-10-03 DIAGNOSIS — E785 Hyperlipidemia, unspecified: Secondary | ICD-10-CM | POA: Diagnosis not present

## 2024-10-03 DIAGNOSIS — S72142D Displaced intertrochanteric fracture of left femur, subsequent encounter for closed fracture with routine healing: Secondary | ICD-10-CM | POA: Diagnosis not present

## 2024-10-03 DIAGNOSIS — E1122 Type 2 diabetes mellitus with diabetic chronic kidney disease: Secondary | ICD-10-CM | POA: Diagnosis not present

## 2024-10-03 DIAGNOSIS — D62 Acute posthemorrhagic anemia: Secondary | ICD-10-CM | POA: Diagnosis not present

## 2024-10-03 NOTE — Patient Instructions (Signed)
 Visit Information  Thank you for taking time to visit with me today. Please don't hesitate to contact me if I can be of assistance to you before our next scheduled telephone appointment.  Our next appointment is by telephone on Tuesday October 28th at 2:00pm  Following is a copy of your care plan:   Goals Addressed             This Visit's Progress    VBCI Transitions of Care (TOC) Care Plan       Problems: (reviewed 10/03/24) Recent Hospitalization for treatment of Acute respiratory failure with hypoxia Knowledge Deficit Related to Acute respiratory failure with hypoxia/ pneumonia  Goal: (reviewed 10/03/24) Over the next 30 days, the patient will not experience hospital readmission  Interventions: (reviewed 10/03/24) Transitions of Care: Doctor Visits  - discussed the importance of doctor visits Referral to Longitudinal Nurse Case Manager for Ongoing follow-up Keep legs elevated when not sitting for complaints of BLE edema Lasix  as needed for increased swelling Limit sodium intake  Patient Self Care Activities: (reviewed 10/03/24) Attend all scheduled provider appointments Call pharmacy for medication refills 3-7 days in advance of running out of medications Call provider office for new concerns or questions  Notify RN Care Manager of TOC call rescheduling needs Participate in Transition of Care Program/Attend TOC scheduled calls Take medications as prescribed   Monitor daily weights Monitor for edema Monitor oxygen saturations Oxygen 3l/min continuous Participate in HHPT and OT as able  Plan:  Next PCP appointment scheduled for: Patient son will call for appointment - scheduled for 10/06/24 Telephone follow up appointment with care management team member scheduled for:  Medford Balboa 09/13/2024 2:00        Patient verbalizes understanding of instructions and care plan provided today and agrees to view in MyChart. Active MyChart status and patient understanding of  how to access instructions and care plan via MyChart confirmed with patient.     The patient has been provided with contact information for the care management team and has been advised to call with any health related questions or concerns.   Please call the care guide team at [phone] if you need to cancel or reschedule your appointment.   Please call the Suicide and Crisis Lifeline: 988 call the [place]  National Suicide Prevention Lifeline: 3392206552 or TTY: 919-707-6571 TTY [phone]) to talk to a trained counselor if you are experiencing a Mental Health or Behavioral Health Crisis or need someone to talk to.  Medford Balboa, BSN, RN [place]  VBCI - [employer] [facility] RN Care Manager [phone]

## 2024-10-03 NOTE — Transitions of Care (Post Inpatient/ED Visit) (Signed)
 Transition of Care week 2  Visit Note  10/03/2024  Name: [name] Griffin MRN: [id number]          DOB: 12-22-1927  Situation: Patient enrolled in [facility] 30-day program. Visit completed with Goldie Plaza by telephone.   Background:   Initial Transition Care Management Follow-up Telephone Call Discharge Date and Diagnosis: 09/24/24, Acute respiratory failure with hypoxia   Past Medical History:  Diagnosis Date   CKD (chronic kidney disease)    Diabetes mellitus without complication (HCC)    Essential hypertension, benign    Mixed hyperlipidemia     Assessment: Patient Reported Symptoms: Cognitive Cognitive Status: Able to follow simple commands, Confused or disoriented, Difficulties with attention and concentration, Poor judgment in daily scenarios, Struggling with memory recall, Requires Assistance Decision Making      Neurological Neurological Review of Symptoms: No symptoms reported    HEENT HEENT Symptoms Reported: No symptoms reported      Cardiovascular Cardiovascular Symptoms Reported: Swelling in legs or feet Does patient have uncontrolled Hypertension?: No Cardiovascular Management Strategies: Medication therapy, Routine screening, Adequate rest Cardiovascular Comment: The patient's son states he gives her the Lasix  on most days  Respiratory Respiratory Symptoms Reported: No symptoms reported Additional Respiratory Details: The patient is wearing oxygen continuous and her O2 sats drop to the 80's with ambulation. HHPT will not walk with her yet or do exercises until her sats improved. She spends most of her time in bed. She does use incentive spirometer. The family is trying to pursuade her to do a little more activity to improve her strength Respiratory Management Strategies: Oxygen therapy, Medication therapy, Routine screening, Activity  Endocrine      Gastrointestinal Gastrointestinal Symptoms Reported: No symptoms reported      Genitourinary Genitourinary  Symptoms Reported: Incontinence Genitourinary Management Strategies: Incontinence garment/pad, Medication therapy, Fluid modification, Activity  Integumentary Integumentary Symptoms Reported: No symptoms reported    Musculoskeletal Musculoskelatal Symptoms Reviewed: Unsteady gait, Limited mobility, Weakness Additional Musculoskeletal Details: Does not walk much Musculoskeletal Management Strategies: Medication therapy, Medical device, Adequate rest Musculoskeletal Comment: HHPT is on hold until her Oxygen saturation rates improve when she is out of bed      Psychosocial Psychosocial Symptoms Reported: Not assessed Additional Psychological Details: The patient has dementia and is HOH         There were no vitals filed for this visit.  Medications Reviewed Today     Reviewed by Moises Reusing, RN (Case Manager) on 10/03/24 at 1354  Med List Status: <None>   Medication Order Taking? Sig Documenting Provider Last Dose Status Informant  acetaminophen  (TYLENOL ) 325 MG tablet [id number]  Take 2 tablets (650 mg total) by mouth every 6 (six) hours as needed for mild pain (pain score 1-3) or fever. Charlene Debby BROCKS, PA-C  Active Child  albuterol  (PROVENTIL ) (2.5 MG/3ML) 0.083% nebulizer solution [id number]  Take 3 mLs (2.5 mg total) by nebulization every 4 (four) hours as needed for wheezing or shortness of breath. Cox, Amy N, DO  Active   allopurinol  (ZYLOPRIM ) 100 MG tablet [id number]  TAKE 1 TABLET BY MOUTH EVERY DAY Fernand Fredy RAMAN, MD  Active Child  amoxicillin-clavulanate (AUGMENTIN) 875-125 MG tablet [id number]  Take 1 tablet by mouth every 12 (twelve) hours. Cox, Amy N, DO  Active   aspirin  EC 325 MG tablet [id number]  Take 1 tablet (325 mg total) by mouth daily. Charlene Debby BROCKS, PA-C  Active Child  atenolol  (TENORMIN ) 25 MG tablet [id number]  TAKE 1 TABLET  BY MOUTH EVERY DAY Fernand Fredy RAMAN, MD  Active Child  cyanocobalamin  1000 MCG tablet [id number]  Take 1 tablet (1,000 mcg total) by mouth  daily. Jens Durand, MD  Active Child  dextromethorphan-guaiFENesin  (MUCINEX  DM) 30-600 MG 12hr tablet [id number]  Take 1 tablet by mouth 2 (two) times daily as needed for cough. Cox, Amy N, DO  Active   donepezil  (ARICEPT ) 10 MG tablet [id number]  TAKE 1 TABLET BY MOUTH EVERYDAY AT BEDTIME Fernand Fredy RAMAN, MD  Active   furosemide  (LASIX ) 20 MG tablet [id number]  Take 1 tablet (20 mg total) by mouth daily as needed for edema or fluid. Cox, Amy N, DO  Active   lisinopril  (ZESTRIL ) 5 MG tablet [id number]  TAKE 1 TABLET BY MOUTH EVERY DAY Fernand Fredy RAMAN, MD  Active Child  OXYGEN [id number] Yes Inhale 3 L/min into the lungs continuous. [provider]  Active   senna-docusate (SENOKOT-S) 8.6-50 MG tablet [id number]  Take 1 tablet by mouth at bedtime as needed for mild constipation. Jens Durand, MD  Active Child            Recommendation:   Continue Current Plan of Care  Follow Up Plan:   Telephone follow-up in 1 week  Medford Balboa, BSN, RN [place]  VBCI - [facility] RN Care Manager [phone]

## 2024-10-05 ENCOUNTER — Emergency Department

## 2024-10-05 ENCOUNTER — Inpatient Hospital Stay
Admission: EM | Admit: 2024-10-05 | Discharge: 2024-10-14 | DRG: 193 | Disposition: E | Attending: Obstetrics and Gynecology | Admitting: Obstetrics and Gynecology

## 2024-10-05 DIAGNOSIS — Z9981 Dependence on supplemental oxygen: Secondary | ICD-10-CM

## 2024-10-05 DIAGNOSIS — Z79899 Other long term (current) drug therapy: Secondary | ICD-10-CM

## 2024-10-05 DIAGNOSIS — F0390 Unspecified dementia without behavioral disturbance: Secondary | ICD-10-CM | POA: Diagnosis not present

## 2024-10-05 DIAGNOSIS — I130 Hypertensive heart and chronic kidney disease with heart failure and stage 1 through stage 4 chronic kidney disease, or unspecified chronic kidney disease: Secondary | ICD-10-CM | POA: Diagnosis present

## 2024-10-05 DIAGNOSIS — I5033 Acute on chronic diastolic (congestive) heart failure: Secondary | ICD-10-CM | POA: Diagnosis present

## 2024-10-05 DIAGNOSIS — Z66 Do not resuscitate: Secondary | ICD-10-CM | POA: Diagnosis present

## 2024-10-05 DIAGNOSIS — Y95 Nosocomial condition: Secondary | ICD-10-CM | POA: Diagnosis present

## 2024-10-05 DIAGNOSIS — J188 Other pneumonia, unspecified organism: Principal | ICD-10-CM

## 2024-10-05 DIAGNOSIS — E669 Obesity, unspecified: Secondary | ICD-10-CM | POA: Diagnosis present

## 2024-10-05 DIAGNOSIS — E782 Mixed hyperlipidemia: Secondary | ICD-10-CM | POA: Diagnosis present

## 2024-10-05 DIAGNOSIS — J90 Pleural effusion, not elsewhere classified: Secondary | ICD-10-CM | POA: Diagnosis not present

## 2024-10-05 DIAGNOSIS — M109 Gout, unspecified: Secondary | ICD-10-CM | POA: Diagnosis present

## 2024-10-05 DIAGNOSIS — Z7982 Long term (current) use of aspirin: Secondary | ICD-10-CM | POA: Diagnosis not present

## 2024-10-05 DIAGNOSIS — E1122 Type 2 diabetes mellitus with diabetic chronic kidney disease: Secondary | ICD-10-CM | POA: Diagnosis present

## 2024-10-05 DIAGNOSIS — I509 Heart failure, unspecified: Secondary | ICD-10-CM | POA: Diagnosis not present

## 2024-10-05 DIAGNOSIS — H9190 Unspecified hearing loss, unspecified ear: Secondary | ICD-10-CM | POA: Diagnosis present

## 2024-10-05 DIAGNOSIS — Z833 Family history of diabetes mellitus: Secondary | ICD-10-CM | POA: Diagnosis not present

## 2024-10-05 DIAGNOSIS — Z1152 Encounter for screening for COVID-19: Secondary | ICD-10-CM | POA: Diagnosis not present

## 2024-10-05 DIAGNOSIS — G9340 Encephalopathy, unspecified: Secondary | ICD-10-CM | POA: Diagnosis not present

## 2024-10-05 DIAGNOSIS — R7989 Other specified abnormal findings of blood chemistry: Secondary | ICD-10-CM | POA: Diagnosis present

## 2024-10-05 DIAGNOSIS — J9622 Acute and chronic respiratory failure with hypercapnia: Secondary | ICD-10-CM | POA: Diagnosis present

## 2024-10-05 DIAGNOSIS — I110 Hypertensive heart disease with heart failure: Secondary | ICD-10-CM | POA: Diagnosis not present

## 2024-10-05 DIAGNOSIS — J189 Pneumonia, unspecified organism: Secondary | ICD-10-CM | POA: Diagnosis not present

## 2024-10-05 DIAGNOSIS — R5381 Other malaise: Secondary | ICD-10-CM | POA: Diagnosis present

## 2024-10-05 DIAGNOSIS — J9621 Acute and chronic respiratory failure with hypoxia: Secondary | ICD-10-CM | POA: Diagnosis not present

## 2024-10-05 DIAGNOSIS — R0602 Shortness of breath: Secondary | ICD-10-CM | POA: Diagnosis not present

## 2024-10-05 DIAGNOSIS — Z515 Encounter for palliative care: Secondary | ICD-10-CM

## 2024-10-05 DIAGNOSIS — E1129 Type 2 diabetes mellitus with other diabetic kidney complication: Secondary | ICD-10-CM | POA: Diagnosis present

## 2024-10-05 DIAGNOSIS — I10 Essential (primary) hypertension: Secondary | ICD-10-CM | POA: Diagnosis present

## 2024-10-05 DIAGNOSIS — N1831 Chronic kidney disease, stage 3a: Secondary | ICD-10-CM | POA: Diagnosis present

## 2024-10-05 DIAGNOSIS — R590 Localized enlarged lymph nodes: Secondary | ICD-10-CM | POA: Diagnosis not present

## 2024-10-05 DIAGNOSIS — I2510 Atherosclerotic heart disease of native coronary artery without angina pectoris: Secondary | ICD-10-CM | POA: Diagnosis not present

## 2024-10-05 DIAGNOSIS — I700 Atherosclerosis of aorta: Secondary | ICD-10-CM | POA: Diagnosis not present

## 2024-10-05 LAB — CBC WITH DIFFERENTIAL/PLATELET
Abs Immature Granulocytes: 0.03 K/uL (ref 0.00–0.07)
Basophils Absolute: 0 K/uL (ref 0.0–0.1)
Basophils Relative: 0 %
Eosinophils Absolute: 0.4 K/uL (ref 0.0–0.5)
Eosinophils Relative: 3 %
HCT: 42.5 % (ref 36.0–46.0)
Hemoglobin: 12.6 g/dL (ref 12.0–15.0)
Immature Granulocytes: 0 %
Lymphocytes Relative: 14 %
Lymphs Abs: 1.6 K/uL (ref 0.7–4.0)
MCH: 28.6 pg (ref 26.0–34.0)
MCHC: 29.6 g/dL — ABNORMAL LOW (ref 30.0–36.0)
MCV: 96.4 fL (ref 80.0–100.0)
Monocytes Absolute: 0.7 K/uL (ref 0.1–1.0)
Monocytes Relative: 6 %
Neutro Abs: 8.5 K/uL — ABNORMAL HIGH (ref 1.7–7.7)
Neutrophils Relative %: 77 %
Platelets: 241 K/uL (ref 150–400)
RBC: 4.41 MIL/uL (ref 3.87–5.11)
RDW: 14.1 % (ref 11.5–15.5)
WBC: 11.2 K/uL — ABNORMAL HIGH (ref 4.0–10.5)
nRBC: 0 % (ref 0.0–0.2)

## 2024-10-05 LAB — COMPREHENSIVE METABOLIC PANEL WITH GFR
ALT: 8 U/L (ref 0–44)
AST: 16 U/L (ref 15–41)
Albumin: 2.7 g/dL — ABNORMAL LOW (ref 3.5–5.0)
Alkaline Phosphatase: 100 U/L (ref 38–126)
Anion gap: 13 (ref 5–15)
BUN: 20 mg/dL (ref 8–23)
CO2: 30 mmol/L (ref 22–32)
Calcium: 8.8 mg/dL — ABNORMAL LOW (ref 8.9–10.3)
Chloride: 98 mmol/L (ref 98–111)
Creatinine, Ser: 0.91 mg/dL (ref 0.44–1.00)
GFR, Estimated: 58 mL/min — ABNORMAL LOW (ref >60)
Glucose, Bld: 129 mg/dL — ABNORMAL HIGH (ref 70–99)
Potassium: 3.4 mmol/L — ABNORMAL LOW (ref 3.5–5.1)
Sodium: 141 mmol/L (ref 135–145)
Total Bilirubin: 0.4 mg/dL (ref 0.0–1.2)
Total Protein: 7.1 g/dL (ref 6.5–8.1)

## 2024-10-05 LAB — TROPONIN I (HIGH SENSITIVITY): Troponin I (High Sensitivity): 76 ng/L — ABNORMAL HIGH (ref <18)

## 2024-10-05 LAB — BRAIN NATRIURETIC PEPTIDE: B Natriuretic Peptide: 412.3 pg/mL — ABNORMAL HIGH (ref 0.0–100.0)

## 2024-10-05 LAB — LACTIC ACID, PLASMA: Lactic Acid, Venous: 1.7 mmol/L (ref 0.5–1.9)

## 2024-10-05 MED ORDER — AZITHROMYCIN 500 MG IV SOLR
500.0000 mg | Freq: Once | INTRAVENOUS | Status: AC
  Administered 2024-10-06: 500 mg via INTRAVENOUS
  Filled 2024-10-05: qty 5

## 2024-10-05 MED ORDER — VANCOMYCIN HCL IN DEXTROSE 1-5 GM/200ML-% IV SOLN
1000.0000 mg | Freq: Once | INTRAVENOUS | Status: DC
Start: 2024-10-05 — End: 2024-10-06

## 2024-10-05 MED ORDER — SODIUM CHLORIDE 0.9 % IV SOLN
2.0000 g | Freq: Once | INTRAVENOUS | Status: AC
  Administered 2024-10-05: 2 g via INTRAVENOUS
  Filled 2024-10-05: qty 12.5

## 2024-10-05 MED ORDER — FUROSEMIDE 10 MG/ML IJ SOLN
20.0000 mg | Freq: Once | INTRAMUSCULAR | Status: AC
  Administered 2024-10-06: 20 mg via INTRAVENOUS
  Filled 2024-10-05: qty 4

## 2024-10-05 NOTE — Sepsis Progress Note (Signed)
 Elink monitoring for the code sepsis protocol.

## 2024-10-05 NOTE — Sepsis Progress Note (Addendum)
 Notified bedside nurse of need to draw blood cultures. Request to confirm if antibiotics were started prior to blood culture draws. Via Secure chat.   2340- RN confirmed blood cultures were drawn prior to patient assignment and before antibiotics were started.

## 2024-10-05 NOTE — ED Provider Notes (Signed)
 [facility] Provider Note    Event Date/Time   First MD Initiated Contact with Patient 10/05/24 2210     (approximate)   History   Shortness of Breath   HPI  [name] Griffin is a 89 year old female with history of hypertension, diabetes, dementia presenting to the emergency department for evaluation of shortness of breath.  Patient went to get out of bed and was noted to be significantly short of breath when found by her son.  Sats in the 70s, elevated heart rate.  They have been using 3 L nasal cannula at home.  Arrives via EMS with nonrebreather in place with improved saturations.  Reviewed her discharge summary from 09/24/2024.  At that time patient presented with shortness of breath, admitted for heart failure exacerbation.  PE study without evidence of PE.  Diffuse infiltrates noted concerning for multifocal pneumonia.  She was placed on Unasyn and azithromycin , discharged on Augmentin.       Physical Exam   Triage Vital Signs: ED Triage Vitals [10/05/24 2205]  Encounter Vitals Group     BP      Girls Systolic BP Percentile      Girls Diastolic BP Percentile      Boys Systolic BP Percentile      Boys Diastolic BP Percentile      Pulse      Resp      Temp      Temp src      SpO2      Weight 153 lb 14.4 oz (69.8 kg)     Height 5' (1.524 m)     Head Circumference      Peak Flow      Pain Score      Pain Loc      Pain Education      Exclude from Growth Chart     Most recent vital signs: Vitals:   10/05/24 2206  BP: 135/81  Pulse: 88  Resp: (!) 30  Temp: 98.3 F (36.8 C)  SpO2: 98%     General: Awake, interactive, very hard of hearing CV:  Good peripheral perfusion Resp:  Tachypneic with labored respirations, coarse lung sound bilaterally Abd:  Nondistended.  Neuro:  Symmetric facial movement, fluid speech   ED Results / Procedures / Treatments   Labs (all labs ordered are listed, but only abnormal results are  displayed) Labs Reviewed  CBC WITH DIFFERENTIAL/PLATELET - Abnormal; Notable for the following components:      Result Value   WBC 11.2 (*)    MCHC 29.6 (*)    Neutro Abs 8.5 (*)    All other components within normal limits  COMPREHENSIVE METABOLIC PANEL WITH GFR - Abnormal; Notable for the following components:   Potassium 3.4 (*)    Glucose, Bld 129 (*)    Calcium 8.8 (*)    Albumin 2.7 (*)    GFR, Estimated 58 (*)    All other components within normal limits  BRAIN NATRIURETIC PEPTIDE - Abnormal; Notable for the following components:   B Natriuretic Peptide 412.3 (*)    All other components within normal limits  BLOOD GAS, VENOUS - Abnormal; Notable for the following components:   pCO2, Ven 67 (*)    Bicarbonate 41.5 (*)    Acid-Base Excess 13.5 (*)    All other components within normal limits  TROPONIN I (HIGH SENSITIVITY) - Abnormal; Notable for the following components:   Troponin I (High Sensitivity) 76 (*)  All other components within normal limits  RESP PANEL BY RT-PCR (RSV, FLU A&B, COVID)  [id number]  CULTURE, BLOOD (ROUTINE X 2)  CULTURE, BLOOD (ROUTINE X 2)  LACTIC ACID, PLASMA  TROPONIN I (HIGH SENSITIVITY)     EKG EKG independently reviewed and interpreted by myself demonstrates:  EKG demonstrates normal sinus rhythm at a rate of 87, PR 156, QRS 72, QTc 447, no acute ST changes  RADIOLOGY Imaging independently reviewed and interpreted by myself demonstrates:  CXR concerning for worsening multifocal pneumonia  Formal Radiology Read:  DG Chest Portable 1 View Result Date: 10/05/2024 EXAM: 1 VIEW(S) XRAY OF THE CHEST 10/05/2024 10:33:00 PM COMPARISON: 09/19/2024 CLINICAL HISTORY: shortness of breath. Pt to ED BIB [facility] with c/o shortness of breath, found to be satting in the 70s with EMS, recent dx of pneumonia. Pt arrives on NRB. EMS attempted to wean down to 6L but pt desatting into the 80s. Lung sounds clear, baseline dementia. FINDINGS: LUNGS AND  PLEURA: Patchy interstitial and airspace opacities right breast from 10 / 7 / 25. Persistent small bilateral pleural effusions. No pneumothorax. HEART AND MEDIASTINUM: Aortic calcification. No acute abnormality of the cardiac and mediastinal silhouettes. BONES AND SOFT TISSUES: No acute osseous abnormality. IMPRESSION: 1. Findings favor worsening multifocal pneumonia compared to 10 / 7 / 25. small bilateral pleural effusions. Electronically signed by: Norman Gatlin MD 10/05/2024 10:42 PM EDT RP Workstation: [id number]    PROCEDURES:  Critical Care performed: Yes, see critical care procedure note(s)  CRITICAL CARE Performed by: Nilsa Dade   Total critical care time: 32 minutes  Critical care time was exclusive of separately billable procedures and treating other patients.  Critical care was necessary to treat or prevent imminent or life-threatening deterioration.  Critical care was time spent personally by me on the following activities: development of treatment plan with patient and/or surrogate as well as nursing, discussions with consultants, evaluation of patient's response to treatment, examination of patient, obtaining history from patient or surrogate, ordering and performing treatments and interventions, ordering and review of laboratory studies, ordering and review of radiographic studies, pulse oximetry and re-evaluation of patient's condition.   Procedures   MEDICATIONS ORDERED IN ED: Medications  vancomycin (VANCOCIN) IVPB 1000 mg/200 mL premix (has no administration in time range)  azithromycin  (ZITHROMAX ) 500 mg in sodium chloride  0.9 % 250 mL IVPB (has no administration in time range)  furosemide  (LASIX ) injection 20 mg (has no administration in time range)  ceFEPIme (MAXIPIME) 2 g in sodium chloride  0.9 % 100 mL IVPB (0 g Intravenous Stopped 10/05/24 2357)     IMPRESSION / MDM / ASSESSMENT AND PLAN / ED COURSE  I reviewed the triage vital signs and the nursing  notes.  Differential diagnosis includes, but is not limited to, worsening pneumonia, CHF exacerbation, lower suspicion pulmonary embolism with recent negative CTA, symmetric lower extremity swelling  Patient's presentation is most consistent with acute presentation with potential threat to life or bodily function.  89 year old female presenting to the emergency department for evaluation of shortness of breath.  Arrives on nonrebreather, able to be transition to high flow nasal cannula at 15, sats 100% suspect will be able to be weaned.  Mild leukocytosis noted.  CMP without critical derangements.  Elevated troponin likely related to demand from her significant hypoxia.  Normal lactate.  VBG with some hypercarbia, but normal pH, with possible chronic retention.  BNP elevated.  Concerns for worsening pneumonia, would likely component of volume overload as well.  Does  meet sepsis criteria, ordered for broad-spectrum antibiotics.  I am concerned about acute volume overload and I do think fluids would likely be detrimental so have not ordered IV fluids for her.  Ordered for small dose of Lasix .  Case reviewed with Dr. Cleatus with hospitalist team.  She will evaluate for anticipated admission.      FINAL CLINICAL IMPRESSION(S) / ED DIAGNOSES   Final diagnoses:  Multifocal pneumonia  Acute on chronic congestive heart failure, unspecified heart failure type (HCC)     Rx / DC Orders   ED Discharge Orders     None        Note:  This document was prepared using Dragon voice recognition software and may include unintentional dictation errors.   Levander Slate, MD 10/05/24 [phone]

## 2024-10-05 NOTE — ED Triage Notes (Signed)
 Pt to ED BIB [facility] with c/o shortness of breath, found to be satting in the 70s with EMS, recent dx of pneumonia. Pt arrives on NRB. EMS attempted to wean down to 6L but pt desatting into the 80s. Lung sounds clear, baseline dementia.

## 2024-10-06 ENCOUNTER — Ambulatory Visit: Admitting: Internal Medicine

## 2024-10-06 ENCOUNTER — Inpatient Hospital Stay
Admit: 2024-10-06 | Discharge: 2024-10-06 | Disposition: A | Discharge status: Home / Self Care | Attending: Obstetrics and Gynecology | Admitting: Obstetrics and Gynecology

## 2024-10-06 ENCOUNTER — Encounter: Payer: Self-pay | Admitting: Internal Medicine

## 2024-10-06 ENCOUNTER — Other Ambulatory Visit: Payer: Self-pay

## 2024-10-06 DIAGNOSIS — J9621 Acute and chronic respiratory failure with hypoxia: Secondary | ICD-10-CM | POA: Diagnosis not present

## 2024-10-06 DIAGNOSIS — J9622 Acute and chronic respiratory failure with hypercapnia: Secondary | ICD-10-CM

## 2024-10-06 DIAGNOSIS — F0390 Unspecified dementia without behavioral disturbance: Secondary | ICD-10-CM | POA: Insufficient documentation

## 2024-10-06 LAB — RESPIRATORY PANEL BY PCR

## 2024-10-06 LAB — CBC
HCT: 39.5 % (ref 36.0–46.0)
Hemoglobin: 11.6 g/dL — ABNORMAL LOW (ref 12.0–15.0)
MCH: 28.2 pg (ref 26.0–34.0)
MCHC: 29.4 g/dL — ABNORMAL LOW (ref 30.0–36.0)
MCV: 96.1 fL (ref 80.0–100.0)
Platelets: 220 K/uL (ref 150–400)
RBC: 4.11 MIL/uL (ref 3.87–5.11)
RDW: 14.1 % (ref 11.5–15.5)
WBC: 9.8 K/uL (ref 4.0–10.5)
nRBC: 0 % (ref 0.0–0.2)

## 2024-10-06 LAB — BASIC METABOLIC PANEL WITH GFR
Anion gap: 12 (ref 5–15)
BUN: 17 mg/dL (ref 8–23)
CO2: 33 mmol/L — ABNORMAL HIGH (ref 22–32)
Calcium: 8.5 mg/dL — ABNORMAL LOW (ref 8.9–10.3)
Chloride: 98 mmol/L (ref 98–111)
Creatinine, Ser: 0.81 mg/dL (ref 0.44–1.00)
GFR, Estimated: >60 mL/min (ref >60)
Glucose, Bld: 95 mg/dL (ref 70–99)
Potassium: 3.3 mmol/L — ABNORMAL LOW (ref 3.5–5.1)
Sodium: 143 mmol/L (ref 135–145)

## 2024-10-06 LAB — CBG MONITORING, ED
Glucose-Capillary: 99 mg/dL (ref 70–99)
Glucose-Capillary: 97 mg/dL (ref 70–99)
Glucose-Capillary: 92 mg/dL (ref 70–99)
Glucose-Capillary: 95 mg/dL (ref 70–99)

## 2024-10-06 LAB — MRSA NEXT GEN BY PCR, NASAL: MRSA by PCR Next Gen: NOT DETECTED

## 2024-10-06 LAB — PROCALCITONIN: Procalcitonin: <0.1 ng/mL

## 2024-10-06 LAB — RESP PANEL BY RT-PCR (RSV, FLU A&B, COVID)  RVPGX2
Influenza A by PCR: NEGATIVE
Influenza B by PCR: NEGATIVE
Resp Syncytial Virus by PCR: NEGATIVE
SARS Coronavirus 2 by RT PCR: NEGATIVE

## 2024-10-06 LAB — D-DIMER, QUANTITATIVE: D-Dimer, Quant: 3.95 ug{FEU}/mL — ABNORMAL HIGH (ref 0.00–0.50)

## 2024-10-06 LAB — STREP PNEUMONIAE URINARY ANTIGEN: Strep Pneumo Urinary Antigen: NEGATIVE

## 2024-10-06 LAB — TROPONIN I (HIGH SENSITIVITY)
Troponin I (High Sensitivity): 146 ng/L (ref <18)
Troponin I (High Sensitivity): 146 ng/L (ref <18)

## 2024-10-06 LAB — ECHOCARDIOGRAM LIMITED
Calc EF: 64.8 %
Height: 60 in
Single Plane A2C EF: 68.4 %
Single Plane A4C EF: 61.4 %
Weight: 2462.4 [oz_av]

## 2024-10-06 LAB — GLUCOSE, CAPILLARY
Glucose-Capillary: 147 mg/dL — ABNORMAL HIGH (ref 70–99)
Glucose-Capillary: 70 mg/dL (ref 70–99)
Glucose-Capillary: 73 mg/dL (ref 70–99)

## 2024-10-06 MED ORDER — VANCOMYCIN HCL 1250 MG/250ML IV SOLN
1250.0000 mg | INTRAVENOUS | Status: DC
Start: 2024-10-08 — End: 2024-10-06

## 2024-10-06 MED ORDER — ONDANSETRON HCL 4 MG/2ML IJ SOLN: 4.0000 mg | Freq: Four times a day (QID) | INTRAMUSCULAR | Status: DC | PRN

## 2024-10-06 MED ORDER — DONEPEZIL HCL 5 MG PO TABS
10.0000 mg | ORAL_TABLET | Freq: Every day | ORAL | Status: DC
Start: 2024-10-06 — End: 2024-10-10
  Administered 2024-10-06 – 2024-10-09 (×5): 10 mg via ORAL
  Filled 2024-10-06 (×5): qty 2

## 2024-10-06 MED ORDER — ONDANSETRON HCL 4 MG PO TABS: 4.0000 mg | ORAL_TABLET | Freq: Four times a day (QID) | ORAL | Status: DC | PRN

## 2024-10-06 MED ORDER — ALBUTEROL SULFATE (2.5 MG/3ML) 0.083% IN NEBU: 2.5000 mg | INHALATION_SOLUTION | RESPIRATORY_TRACT | Status: DC | PRN

## 2024-10-06 MED ORDER — DM-GUAIFENESIN ER 30-600 MG PO TB12: 1.0000 | ORAL_TABLET | Freq: Two times a day (BID) | ORAL | Status: DC | PRN

## 2024-10-06 MED ORDER — SODIUM CHLORIDE 0.9 % IV SOLN: 2.0000 g | INTRAVENOUS | Status: DC

## 2024-10-06 MED ORDER — SODIUM CHLORIDE 0.9 % IV SOLN: 2.0000 g | Freq: Two times a day (BID) | INTRAVENOUS | Status: DC

## 2024-10-06 MED ORDER — PERFLUTREN LIPID MICROSPHERE
1.0000 mL | INTRAVENOUS | Status: AC | PRN
  Administered 2024-10-06: 2 mL via INTRAVENOUS

## 2024-10-06 MED ORDER — ACETAMINOPHEN 325 MG PO TABS: 650.0000 mg | ORAL_TABLET | Freq: Four times a day (QID) | ORAL | Status: DC | PRN

## 2024-10-06 MED ORDER — SODIUM CHLORIDE 0.9 % IV SOLN: 500.0000 mg | INTRAVENOUS | Status: DC

## 2024-10-06 MED ORDER — FUROSEMIDE 10 MG/ML IJ SOLN
20.0000 mg | Freq: Every day | INTRAMUSCULAR | Status: DC
Start: 2024-10-06 — End: 2024-10-06

## 2024-10-06 MED ORDER — FUROSEMIDE 10 MG/ML IJ SOLN
40.0000 mg | Freq: Two times a day (BID) | INTRAMUSCULAR | Status: DC
  Administered 2024-10-06 – 2024-10-07 (×3): 40 mg via INTRAVENOUS
  Filled 2024-10-06 (×3): qty 4

## 2024-10-06 MED ORDER — ACETAMINOPHEN 650 MG RE SUPP
650.0000 mg | Freq: Four times a day (QID) | RECTAL | Status: DC | PRN
Start: 2024-10-06 — End: 2024-10-10

## 2024-10-06 MED ORDER — SODIUM CHLORIDE 0.9 % IV SOLN
1.0000 g | Freq: Two times a day (BID) | INTRAVENOUS | Status: DC
  Administered 2024-10-06 – 2024-10-10 (×9): 1 g via INTRAVENOUS
  Filled 2024-10-06 (×9): qty 20

## 2024-10-06 MED ORDER — ATENOLOL 25 MG PO TABS
25.0000 mg | ORAL_TABLET | Freq: Every day | ORAL | Status: DC
Start: 2024-10-06 — End: 2024-10-06

## 2024-10-06 MED ORDER — ALLOPURINOL 100 MG PO TABS
100.0000 mg | ORAL_TABLET | Freq: Every day | ORAL | Status: DC
  Administered 2024-10-07 – 2024-10-10 (×4): 100 mg via ORAL
  Filled 2024-10-06 (×4): qty 1

## 2024-10-06 MED ORDER — VANCOMYCIN HCL 1500 MG/300ML IV SOLN
1500.0000 mg | Freq: Once | INTRAVENOUS | Status: AC
  Administered 2024-10-06: 1500 mg via INTRAVENOUS
  Filled 2024-10-06: qty 300

## 2024-10-06 MED ORDER — ENOXAPARIN SODIUM 40 MG/0.4ML IJ SOSY
40.0000 mg | PREFILLED_SYRINGE | INTRAMUSCULAR | Status: DC
  Administered 2024-10-06 – 2024-10-10 (×5): 40 mg via SUBCUTANEOUS
  Filled 2024-10-06 (×5): qty 0.4

## 2024-10-06 MED ORDER — INSULIN ASPART 100 UNIT/ML IJ SOLN
0.0000 [IU] | INTRAMUSCULAR | Status: DC
  Administered 2024-10-06 – 2024-10-07 (×2): 2 [IU] via SUBCUTANEOUS
  Administered 2024-10-08 (×2): 3 [IU] via SUBCUTANEOUS
  Administered 2024-10-09 (×3): 2 [IU] via SUBCUTANEOUS
  Filled 2024-10-06 (×7): qty 1

## 2024-10-06 MED ORDER — LISINOPRIL 5 MG PO TABS
5.0000 mg | ORAL_TABLET | Freq: Every day | ORAL | Status: DC
Start: 2024-10-06 — End: 2024-10-06

## 2024-10-06 NOTE — Evaluation (Signed)
 Clinical/Bedside Swallow Evaluation Patient Details  Name: [name] Griffin MRN: [id number] Date of Birth: October 11, 1928  Today's Date: 10/06/2024 Time: SLP Start Time (ACUTE ONLY): 1140 SLP Stop Time (ACUTE ONLY): 1210 SLP Time Calculation (min) (ACUTE ONLY): 30 min  Past Medical History:  Past Medical History:  Diagnosis Date   CKD (chronic kidney disease)    Diabetes mellitus without complication (HCC)    Essential hypertension, benign    Mixed hyperlipidemia    Past Surgical History:  Past Surgical History:  Procedure Laterality Date   ORIF HIP FRACTURE Left 07/24/2024   Procedure: OPEN REDUCTION INTERNAL FIXATION HIP;  Surgeon: Maryrose Cordella Charleston, MD;  Location: [facility] ORS;  Service: Orthopedics;  Laterality: Left;  Fracture table, C arm, Synthes TFN-A   HPI:  [name] Griffin is a 89 y.o. female with medical history significant for Chronic HFpEF (EF 60 to 65%, G1 DD, 07/25/2024), stage IIIa CKD, HTN, DM, recently hospitalized (10/7 to 09/22/2024) with acute hypoxic respiratory failure secondary to multifocal pneumonia and CHF exacerbation, discharged on home O2 at 3 L, admitted with worsening multifocal pneumonia, possible sepsis as well as CHF exacerbation. CXR showed findings consistent with worsening multifocal pneumonia compared to 09/19/2024. No prior history of dysphagia or prior SLP assessments found in review of healthcare record.    Assessment / Plan / Recommendation  Clinical Impression  Patient presents with mild to moderate risk for aspiration due to ill-fitting/loose dentures and diminished respiratory status. Client alert and conversed with SLP, however difficult to communicate with her due to severity of hearing loss. No focal neurological deficits noted, mouth and tongue appeared dry. With upright positioning and assistance, patient consumed items from her lunch tray (dysphagia 3/thin). Respiratory rate fluctuated between 22 and 26, increasing with exertion when she  reached for cup or leaned forward to reach spoon. No overt signs of aspiration with any consistency, however patient required extended time for mastication of meats, tougher parts of broccoli and pineapple, ultimately expectorating these after prolonged chewing. In the setting of higher respiratory rate, prolonged mastication can increase risk for aspiration of solids, with patient taking breaths while still manipulating solids. Recommend downgrade of diet to dysphagia 2/thin, with meats chopped and moistened, to facilitate safer swallowing during acute stay. Recommend staff to assist with positioning fully upright, assist with feeding to minimize fatigue/exertion, and encourage rest breaks as needed. Feed only when respiratory rate remains below 30. Recommend meds one at a time, whole with puree for safer swallowing. No further skilled ST indicated at this time, SLP to s/o.    SLP Visit Diagnosis: Dysphagia, oral phase (R13.11)    Aspiration Risk  Mild aspiration risk;Moderate aspiration risk    Diet Recommendation Dysphagia 2 (Fine chop);Thin liquid (meats moistened)    Liquid Administration via: Cup;Straw Medication Administration: Whole meds with puree Supervision: Staff to assist with self feeding Compensations: Slow rate;Small sips/bites;Lingual sweep for clearance of pocketing;Follow solids with liquid (Rest breaks if SOB) Postural Changes: Seated upright at 90 degrees    Other  Recommendations Oral Care Recommendations: Oral care BID     Assistance Recommended at Discharge    Functional Status Assessment Patient has had a recent decline in their functional status and/or demonstrates limited ability to make significant improvements in function in a reasonable and predictable amount of time  Frequency and Duration Other (Comment) (one time only)          Prognosis Prognosis for improved oropharyngeal function: Fair (given dentition and respiratory status)  Barriers to Reach Goals:  Cognitive deficits;Other (Comment) (hearing loss)      Swallow Study   General Date of Onset: 10/05/24 HPI: [name] Griffin is a 89 y.o. female with medical history significant for Chronic HFpEF (EF 60 to 65%, G1 DD, 07/25/2024), stage IIIa CKD, HTN, DM, recently hospitalized (10/7 to 09/22/2024) with acute hypoxic respiratory failure secondary to multifocal pneumonia and CHF exacerbation, discharged on home O2 at 3 L, admitted with worsening multifocal pneumonia, possible sepsis as well as CHF exacerbation. CXR showed findings consistent with worsening multifocal pneumonia compared to 09/19/2024. No prior history of dysphagia or prior SLP assessments found in review of healthcare record. Type of Study: Bedside Swallow Evaluation Previous Swallow Assessment: none on file Diet Prior to this Study: Dysphagia 3 (mechanical soft);Thin liquids (Level 0) Temperature Spikes Noted: No Respiratory Status: Nasal cannula History of Recent Intubation: No Behavior/Cognition: Alert;Requires cueing Oral Cavity Assessment: Dry Oral Care Completed by SLP: No Oral Cavity - Dentition: Dentures, top;Dentures, bottom (ill-fitting, loose) Vision: Functional for self-feeding Self-Feeding Abilities: Needs assist Patient Positioning: Upright in bed Baseline Vocal Quality: Normal Volitional Cough: Other (Comment) (unable to hear commands) Volitional Swallow:  (unable to hear commands)    Oral/Motor/Sensory Function Overall Oral Motor/Sensory Function: Within functional limits   Ice Chips Ice chips: Within functional limits Presentation: Spoon   Thin Liquid Thin Liquid: Within functional limits Presentation: Self Fed;Cup;Spoon    Nectar Thick Nectar Thick Liquid: Within functional limits Presentation: Spoon (soup) Other Comments: required assistance   Honey Thick Honey Thick Liquid: Not tested   Puree Puree: Within functional limits Presentation: Spoon   Solid     Solid: Impaired Presentation: Spoon Oral  Phase Impairments: Impaired mastication Oral Phase Functional Implications: Impaired mastication;Oral residue Pharyngeal Phase Impairments: Other (comments) (none observed) Other Comments: Spit out pieces of pineapple and chopped chicken after chewing     [name] Landry [place], MS, [employer] Speech-Language Pathologist Office: [phone] ASCOM: [phone]  [name] FORBES Scotland 10/06/2024,12:28 PM

## 2024-10-06 NOTE — Assessment & Plan Note (Signed)
 Sliding scale insulin  coverage

## 2024-10-06 NOTE — Progress Notes (Signed)
 Pharmacy Antibiotic Note  [name] Griffin is a 89 y.o. female admitted on 10/05/2024 with pneumonia.  Pharmacy has been consulted for Vanc, Cefepime dosing.  Plan: Cefepime 2 gm IV X 1 given in ED on 10/23 @ 2324. Cefepime 2 gm IV Q12H ordered to start on 10/24 @ 1100.  Vancomycin 1500 mg IV X 1 loading dose ordered for 10/24 @ ~ 0200. Vancomycin 1250 mg IV Q48H ordered to start on 10/26 @ 0200.  AUC = 461.7 Vanc trough = 9.8   Height: 5' (152.4 cm) Weight: 69.8 kg (153 lb 14.4 oz) IBW/kg (Calculated) : 45.5  Temp (24hrs), Avg:98.3 F (36.8 C), Min:98.3 F (36.8 C), Max:98.3 F (36.8 C)  Recent Labs  Lab 10/05/24 2225  WBC 11.2*  CREATININE 0.91  LATICACIDVEN 1.7    Estimated Creatinine Clearance: 32.9 mL/min (by C-G formula based on SCr of 0.91 mg/dL).    No Known Allergies  Antimicrobials this admission:   >>    >>   Dose adjustments this admission:   Microbiology results:  BCx:   UCx:    Sputum:    MRSA PCR:   Thank you for allowing pharmacy to be a part of this patient's care.  Hiawatha Dressel D 10/06/2024 2:02 AM

## 2024-10-06 NOTE — Assessment & Plan Note (Signed)
 Chest x-ray showing worsening multifocal pneumonia compared to 09/19/2024 Vancomycin and cefepime Antitussives SLP consult to evaluate aspiration risk-Will keep n.p.o. until then Albuterol  as needed Follow blood cultures Continue supplemental oxygen

## 2024-10-06 NOTE — Assessment & Plan Note (Signed)
 Continue home atenolol and lisinopril

## 2024-10-06 NOTE — H&P (Signed)
 History and Physical    Patient: [name] Griffin FMW:[id number] DOB: November 03, 1928 DOA: 10/05/2024 DOS: the patient was seen and examined on 10/06/2024 PCP: Fernand Fredy RAMAN, MD  Patient coming from: Home  Chief Complaint:  Chief Complaint  Patient presents with   Shortness of Breath    HPI: [name] Griffin is a 89 y.o. female with medical history significant for Chronic HFpEF (EF 60 to 65%, G1 DD, 07/25/2024), stage IIIa CKD, HTN, DM, recently hospitalized (10/7 to 09/22/2024) with acute hypoxic respiratory failure secondary to multifocal pneumonia and CHF exacerbation, discharged on home O2 at 3 L, being admitted with worsening multifocal pneumonia, possible sepsis as well as CHF exacerbation.  EMS was called by patient's son who noted her to be very short of breath when trying to get out of bed.  On their arrival, she was tachycardic and hypoxic with O2 sats in the 70s.  She arrived on NRB, transitioned to HFNC on arrival. On arrival, satting at 98% on NRB maintained following transition to HFNC at 15 L/min.  Tachypneic to 30, afebrile respirations 30 VBG on 15 L high flow with pH 7.41, pCO2 67 WBC 11,000, lactic acid 1.7 Troponin 76 (recently in the 30s), and BNP 412, up from 305 a couple weeks prior CMP for the most part unremarkable EKG showed NSR at 87 Chest x-ray showed findings consistent with worsening multifocal pneumonia compared to 09/19/2024 along with small bilateral pleural effusions. Patient started on Maxipime, vancomycin and azithromycin , given a dose of IV Lasix  Admission requested    Past Medical History:  Diagnosis Date   CKD (chronic kidney disease)    Diabetes mellitus without complication (HCC)    Essential hypertension, benign    Mixed hyperlipidemia    Past Surgical History:  Procedure Laterality Date   ORIF HIP FRACTURE Left 07/24/2024   Procedure: OPEN REDUCTION INTERNAL FIXATION HIP;  Surgeon: Maryrose Cordella Charleston, MD;  Location: [facility] ORS;  Service:  Orthopedics;  Laterality: Left;  Fracture table, C arm, Synthes TFN-A   Social History:  reports that she has never smoked. She has never used smokeless tobacco. She reports that she does not currently use alcohol. She reports that she does not use drugs.  No Known Allergies  Family History  Problem Relation Age of Onset   Diabetes Mother     Prior to Admission medications   Medication Sig Start Date End Date Taking? Authorizing Provider  acetaminophen  (TYLENOL ) 325 MG tablet Take 2 tablets (650 mg total) by mouth every 6 (six) hours as needed for mild pain (pain score 1-3) or fever. 07/26/24   Charlene Debby BROCKS, PA-C  albuterol  (PROVENTIL ) (2.5 MG/3ML) 0.083% nebulizer solution Take 3 mLs (2.5 mg total) by nebulization every 4 (four) hours as needed for wheezing or shortness of breath. 09/24/24   Cox, Amy N, DO  allopurinol  (ZYLOPRIM ) 100 MG tablet TAKE 1 TABLET BY MOUTH EVERY DAY 03/06/24   Fernand Fredy RAMAN, MD  amoxicillin-clavulanate (AUGMENTIN) 875-125 MG tablet Take 1 tablet by mouth every 12 (twelve) hours. 09/24/24   Cox, Amy N, DO  aspirin  EC 325 MG tablet Take 1 tablet (325 mg total) by mouth daily. 07/27/24   Charlene Debby BROCKS, PA-C  atenolol  (TENORMIN ) 25 MG tablet TAKE 1 TABLET BY MOUTH EVERY DAY 12/16/23   Fernand Fredy RAMAN, MD  cyanocobalamin  1000 MCG tablet Take 1 tablet (1,000 mcg total) by mouth daily. 07/29/24   Jens Durand, MD  dextromethorphan-guaiFENesin  (MUCINEX  DM) 30-600 MG 12hr tablet  Take 1 tablet by mouth 2 (two) times daily as needed for cough. 09/24/24   Cox, Amy N, DO  donepezil  (ARICEPT ) 10 MG tablet TAKE 1 TABLET BY MOUTH EVERYDAY AT BEDTIME 10/02/24   Fernand Fredy RAMAN, MD  furosemide  (LASIX ) 20 MG tablet Take 1 tablet (20 mg total) by mouth daily as needed for edema or fluid. 09/24/24   Cox, Amy N, DO  lisinopril  (ZESTRIL ) 5 MG tablet TAKE 1 TABLET BY MOUTH EVERY DAY 05/16/24   Fernand Fredy RAMAN, MD  OXYGEN Inhale 3 L/min into the lungs continuous.    [provider]   senna-docusate (SENOKOT-S) 8.6-50 MG tablet Take 1 tablet by mouth at bedtime as needed for mild constipation. 07/28/24   Jens Durand, MD    Physical Exam: Vitals:   10/05/24 2205 10/05/24 2206  BP:  135/81  Pulse:  88  Resp:  (!) 30  Temp:  98.3 F (36.8 C)  TempSrc:  Oral  SpO2:  98%  Weight: 69.8 kg   Height: 5' (1.524 m)    Physical Exam Vitals and nursing note reviewed.  Constitutional:      General: She is not in acute distress. HENT:     Head: Normocephalic and atraumatic.  Cardiovascular:     Rate and Rhythm: Normal rate and regular rhythm.     Heart sounds: Normal heart sounds.  Pulmonary:     Effort: Tachypnea present.     Breath sounds: Normal breath sounds.  Abdominal:     Palpations: Abdomen is soft.     Tenderness: There is no abdominal tenderness.  Musculoskeletal:     Right lower leg: 1+ Edema present.     Left lower leg: 2+ Edema present.  Neurological:     Mental Status: Mental status is at baseline.     Labs on Admission: I have personally reviewed following labs and imaging studies  CBC: Recent Labs  Lab 10/05/24 2225  WBC 11.2*  NEUTROABS 8.5*  HGB 12.6  HCT 42.5  MCV 96.4  PLT 241   Basic Metabolic Panel: Recent Labs  Lab 10/05/24 2225  NA 141  K 3.4*  CL 98  CO2 30  GLUCOSE 129*  BUN 20  CREATININE 0.91  CALCIUM 8.8*   GFR: Estimated Creatinine Clearance: 32.9 mL/min (by C-G formula based on SCr of 0.91 mg/dL). Liver Function Tests: Recent Labs  Lab 10/05/24 2225  AST 16  ALT 8  ALKPHOS 100  BILITOT 0.4  PROT 7.1  ALBUMIN 2.7*   No results for input(s): LIPASE, AMYLASE in the last 168 hours. No results for input(s): AMMONIA in the last 168 hours. Coagulation Profile: No results for input(s): INR, PROTIME in the last 168 hours. Cardiac Enzymes: No results for input(s): CKTOTAL, CKMB, CKMBINDEX, TROPONINI in the last 168 hours. BNP (last 3 results) No results for input(s): PROBNP in the  last 8760 hours. HbA1C: No results for input(s): HGBA1C in the last 72 hours. CBG: No results for input(s): GLUCAP in the last 168 hours. Lipid Profile: No results for input(s): CHOL, HDL, LDLCALC, TRIG, CHOLHDL, LDLDIRECT in the last 72 hours. Thyroid Function Tests: No results for input(s): TSH, T4TOTAL, FREET4, T3FREE, THYROIDAB in the last 72 hours. Anemia Panel: No results for input(s): VITAMINB12, FOLATE, FERRITIN, TIBC, IRON, RETICCTPCT in the last 72 hours. Urine analysis: No results found for: COLORURINE, APPEARANCEUR, LABSPEC, PHURINE, GLUCOSEU, HGBUR, BILIRUBINUR, KETONESUR, PROTEINUR, UROBILINOGEN, NITRITE, LEUKOCYTESUR  Radiological Exams on Admission: DG Chest Portable 1 View Result Date: 10/05/2024 EXAM:  1 VIEW(S) XRAY OF THE CHEST 10/05/2024 10:33:00 PM COMPARISON: 09/19/2024 CLINICAL HISTORY: shortness of breath. Pt to ED BIB [facility] with c/o shortness of breath, found to be satting in the 70s with EMS, recent dx of pneumonia. Pt arrives on NRB. EMS attempted to wean down to 6L but pt desatting into the 80s. Lung sounds clear, baseline dementia. FINDINGS: LUNGS AND PLEURA: Patchy interstitial and airspace opacities right breast from 10 / 7 / 25. Persistent small bilateral pleural effusions. No pneumothorax. HEART AND MEDIASTINUM: Aortic calcification. No acute abnormality of the cardiac and mediastinal silhouettes. BONES AND SOFT TISSUES: No acute osseous abnormality. IMPRESSION: 1. Findings favor worsening multifocal pneumonia compared to 10 / 7 / 25. small bilateral pleural effusions. Electronically signed by: Norman Gatlin MD 10/05/2024 10:42 PM EDT RP Workstation: [id number]   Data Reviewed for HPI: Relevant notes from primary care and specialist visits, past discharge summaries as available in EHR, including Care Everywhere. Prior diagnostic testing as pertinent to current admission diagnoses Updated  medications and problem lists for reconciliation ED course, including vitals, labs, imaging, treatment and response to treatment Triage notes, nursing and pharmacy notes and ED provider's notes Notable results as noted above in HPI      Assessment and Plan: * Acute on chronic respiratory failure with hypoxia and hypercapnia (HCC) Secondary to HCAP and possible CHF Recently discharged on O2 at 3 L EMS report O2 sat in the 70s requiring NRB, transitioned to HFNC VBG on HFNC with pH 7.4 and pCO2 67 Continue HFNC and wean as tolerated Treat HCAP and CHF-please see individual problem  HCAP (healthcare-associated pneumonia) Chest x-ray showing worsening multifocal pneumonia compared to 09/19/2024 Vancomycin and cefepime Antitussives SLP consult to evaluate aspiration risk-Will keep n.p.o. until then Albuterol  as needed Follow blood cultures Continue supplemental oxygen  Acute on chronic heart failure with preserved ejection fraction (HFpEF) (HCC) Small bilateral pleural effusions Patient with shortness of breath, BNP 412 Received IV Lasix  in the ED Last EF 60 to 65% with G1 DD 07/25/2024 Daily weights with intake and output monitoring Continue atenolol  and lisinopril   Elevated troponin Suspect supply/demand mismatch from acute respiratory failure Troponin 76 -->146, recently in the 30s Patient denies chest pain and EKG without ischemia Will continue to monitor Can consider echo to evaluate for focal wall motion abnormality Can consider cardiology consult  Type II diabetes mellitus with renal manifestations (HCC) Sliding scale insulin coverage  Essential hypertension, benign Continue home atenolol  and lisinopril   Chronic kidney disease, stage 3a (HCC) Renal function at baseline        DVT prophylaxis: Lovenox  Consults: none  Advance Care Planning:   Code Status: Prior   Family Communication: none  Disposition Plan: Back to previous home  environment  Severity of Illness: The appropriate patient status for this patient is OBSERVATION. Observation status is judged to be reasonable and necessary in order to provide the required intensity of service to ensure the patient's safety. The patient's presenting symptoms, physical exam findings, and initial radiographic and laboratory data in the context of their medical condition is felt to place them at decreased risk for further clinical deterioration. Furthermore, it is anticipated that the patient will be medically stable for discharge from the hospital within 2 midnights of admission.   Author: Delayne LULLA Solian, MD 10/06/2024 12:16 AM  For on call review [email or web address].

## 2024-10-06 NOTE — Assessment & Plan Note (Signed)
 Secondary to HCAP and possible CHF Recently discharged on O2 at 3 L EMS report O2 sat in the 70s requiring NRB, transitioned to HFNC VBG on HFNC with pH 7.4 and pCO2 67 Continue HFNC and wean as tolerated Treat HCAP and CHF-please see individual problem

## 2024-10-06 NOTE — ED Notes (Signed)
 CCMD called to admit patient to monitor

## 2024-10-06 NOTE — ED Notes (Signed)
Fall bundle in place

## 2024-10-06 NOTE — Assessment & Plan Note (Signed)
 -  Renal function  at baseline

## 2024-10-06 NOTE — ED Notes (Signed)
 This tech responded to call light for this room, where family stated pt needed to be cleaned. This tech changed linen and provided peri care and replaced pts brief and chucks at this time.

## 2024-10-06 NOTE — Assessment & Plan Note (Addendum)
 Suspect supply/demand mismatch from acute respiratory failure Troponin 76 -->146, recently in the 30s Patient denies chest pain and EKG without ischemia Will continue to monitor Can consider echo to evaluate for focal wall motion abnormality Can consider cardiology consult

## 2024-10-06 NOTE — Progress Notes (Signed)
 CODE SEPSIS - PHARMACY COMMUNICATION  **Broad Spectrum Antibiotics should be administered within 1 hour of Sepsis diagnosis**  Time Code Sepsis Called/Page Received: 10/23 @ 2325   Antibiotics Ordered: Cefepime, Vancomycin   Time of 1st antibiotic administration: Cefepime 2 gm IV X 1 on 10/23 @ 2324   Additional action taken by pharmacy:   If necessary, Name of Provider/Nurse Contacted:     Lucyle Alumbaugh D ,PharmD Clinical Pharmacist  10/06/2024  12:22 AM

## 2024-10-06 NOTE — Progress Notes (Signed)
 PROGRESS NOTE    [name] Griffin  FMW:[id number] DOB: [birth year] DOA: 10/05/2024 PCP: Fernand Fredy RAMAN, MD     Brief Narrative:   [name] Griffin is a 89 y.o. female with medical history significant for Chronic HFpEF (EF 60 to 65%, G1 DD, 07/25/2024), stage IIIa CKD, HTN, DM, recently hospitalized (10/7 to 09/22/2024) with acute hypoxic respiratory failure secondary to multifocal pneumonia and CHF exacerbation, discharged on home O2 at 3 L, being admitted with worsening multifocal pneumonia, possible sepsis as well as CHF exacerbation.  EMS was called by patient's son who noted her to be very short of breath when trying to get out of bed.  On their arrival, she was tachycardic and hypoxic with O2 sats in the 70s.  She arrived on NRB, transitioned to HFNC on arrival. On arrival, satting at 98% on NRB maintained following transition to HFNC at 15 L/min.  Tachypneic to 30, afebrile respirations 30 VBG on 15 L high flow with pH 7.41, pCO2 67 WBC 11,000, lactic acid 1.7 Troponin 76 (recently in the 30s), and BNP 412, up from 305 a couple weeks prior CMP for the most part unremarkable EKG showed NSR at 87 Chest x-ray showed findings consistent with worsening multifocal pneumonia compared to 09/19/2024 along with small bilateral pleural effusions. Patient started on Maxipime, vancomycin and azithromycin , given a dose of IV Lasix    Assessment & Plan:   Principal Problem:   Acute on chronic respiratory failure with hypoxia and hypercapnia (HCC) Active Problems:   Obesity (BMI 30-39.9)   HCAP (healthcare-associated pneumonia)   Acute on chronic heart failure with preserved ejection fraction (HFpEF) (HCC)   Elevated troponin   Essential hypertension, benign   Type II diabetes mellitus with renal manifestations (HCC)   Chronic kidney disease, stage 3a (HCC)   Dementia (HCC)  # Acute hypoxic hypercapnic respiratory failure Possibly 2/2 hcap, chf likely also contributing. On 15 liter [place] on arrival  now weaned to 10. No significant dyspnea today. Negative PE eval on 10/7, re-consider repeat if fails to respond to below treatments. Was discharged earlier this month on 3 liters - wean o2 as able  # HCAP CXR showing signs multifocal pna. No significant cough however, no fevers, does have mild leukocytosis. Minimal w/u ordered thus far. Covid/flu/rsv neg. Recent IV abx and hospitalization. - rvp - urine antigens - follow blood cultures - not making sputum for culture - continue vanc/zosyn > vanc/meropenem, check mrsa swab  # Report of tachycardia at home To 150s. Here sinus rhythm, no tachy - tele  # HFpEF with exacerbation # Tropinemia Bnp elevated. Preserved EF with mod phtn on tte in August. Trop 76>146 no EKG changes, not complaining of pain but demented. - IV diuresis lasix  40 bid for home 20 - limited echo - trend trop  # HTN Bp soft - hold atenolol , lisinopril   # Gout - cont home allopurinol   # Dementia No behavioral disturbance - home donepezil   # Debility # History femur fracture Fracture August this year. Recent rehab, hasn't yet regained mobility - PT consult     DVT prophylaxis: lovenox Code Status: dnr/dni Family Communication: son updated telephonically 10/24  Level of care: Progressive Status is: Inpatient Remains inpatient appropriate because: severity of illness    Consultants:  none  Procedures: none  Antimicrobials:  See above    Subjective: Disoriented, no complaints  Objective: Vitals:   10/06/24 0515 10/06/24 0530 10/06/24 0542 10/06/24 0630  BP:  112/78  101/86  Pulse:  61    Resp:  (!) 21  (!) 21  Temp:   98.5 F (36.9 C)   TempSrc:   Axillary   SpO2: 100% 100%    Weight:      Height:       No intake or output data in the 24 hours ending 10/06/24 0829 Filed Weights   10/05/24 2205  Weight: 69.8 kg    Examination:  General exam: Appears calm   Respiratory system: scattered rales Cardiovascular system: S1 &  S2 heard, RRR. Soft systolic murmur Gastrointestinal system: Abdomen is nondistended, soft and nontender. Central nervous system: Alert not oriented Extremities: Symmetric 5 x 5 power. Trace LE edema Skin: No visible rashes, lesions or ulcers Psychiatry: confused     Data Reviewed: I have personally reviewed following labs and imaging studies  CBC: Recent Labs  Lab 10/05/24 2225  WBC 11.2*  NEUTROABS 8.5*  HGB 12.6  HCT 42.5  MCV 96.4  PLT 241   Basic Metabolic Panel: Recent Labs  Lab 10/05/24 2225  NA 141  K 3.4*  CL 98  CO2 30  GLUCOSE 129*  BUN 20  CREATININE 0.91  CALCIUM 8.8*   GFR: Estimated Creatinine Clearance: 32.9 mL/min (by C-G formula based on SCr of 0.91 mg/dL). Liver Function Tests: Recent Labs  Lab 10/05/24 2225  AST 16  ALT 8  ALKPHOS 100  BILITOT 0.4  PROT 7.1  ALBUMIN 2.7*   No results for input(s): LIPASE, AMYLASE in the last 168 hours. No results for input(s): AMMONIA in the last 168 hours. Coagulation Profile: No results for input(s): INR, PROTIME in the last 168 hours. Cardiac Enzymes: No results for input(s): CKTOTAL, CKMB, CKMBINDEX, TROPONINI in the last 168 hours. BNP (last 3 results) No results for input(s): PROBNP in the last 8760 hours. HbA1C: No results for input(s): HGBA1C in the last 72 hours. CBG: Recent Labs  Lab 10/06/24 0236 10/06/24 0448 10/06/24 0804  GLUCAP 99 97 92   Lipid Profile: No results for input(s): CHOL, HDL, LDLCALC, TRIG, CHOLHDL, LDLDIRECT in the last 72 hours. Thyroid Function Tests: No results for input(s): TSH, T4TOTAL, FREET4, T3FREE, THYROIDAB in the last 72 hours. Anemia Panel: No results for input(s): VITAMINB12, FOLATE, FERRITIN, TIBC, IRON, RETICCTPCT in the last 72 hours. Urine analysis: No results found for: COLORURINE, APPEARANCEUR, LABSPEC, PHURINE, GLUCOSEU, HGBUR, BILIRUBINUR, KETONESUR, PROTEINUR,  UROBILINOGEN, NITRITE, LEUKOCYTESUR Sepsis Labs: @LABRCNTIP (procalcitonin:4,lacticidven:4)  ) Recent Results (from the past 240 hours)  Blood Culture (routine x 2)     Status: None (Preliminary result)   Collection Time: 10/05/24 10:25 PM   Specimen: BLOOD  Result Value Ref Range Status   Specimen Description BLOOD BLOOD RIGHT WRIST  Final   Special Requests   Final    BOTTLES DRAWN AEROBIC AND ANAEROBIC Blood Culture results may not be optimal due to an inadequate volume of blood received in culture bottles   Culture   Final    NO GROWTH < 12 HOURS Performed at [facility], [address], [place], [place] [address]    Report Status PENDING  Incomplete  Resp panel by RT-PCR (RSV, Flu A&B, Covid) Anterior Nasal Swab     Status: None   Collection Time: 10/05/24 11:34 PM   Specimen: Anterior Nasal Swab  Result Value Ref Range Status   SARS Coronavirus 2 by RT PCR NEGATIVE NEGATIVE Final    Comment: (NOTE) SARS-CoV-2 target nucleic acids are NOT DETECTED.  The SARS-CoV-2 RNA is generally detectable in  upper respiratory specimens during the acute phase of infection. The lowest concentration of SARS-CoV-2 viral copies this assay can detect is 138 copies/mL. A negative result does not preclude SARS-Cov-2 infection and should not be used as the sole basis for treatment or other patient management decisions. A negative result may occur with  improper specimen collection/handling, submission of specimen other than nasopharyngeal swab, presence of viral mutation(s) within the areas targeted by this assay, and inadequate number of viral copies(<138 copies/mL). A negative result must be combined with clinical observations, patient history, and epidemiological information. The expected result is Negative.  Fact Sheet for Patients:  BloggerCourse.com  Fact Sheet for Healthcare Providers:  SeriousBroker.it  This test is  no t yet approved or cleared by the [place]  FDA and  has been authorized for detection and/or diagnosis of SARS-CoV-2 by FDA under an Emergency Use Authorization (EUA). This EUA will remain  in effect (meaning this test can be used) for the duration of the COVID-19 declaration under Section 564(b)(1) of the Act, 21 U.S.C.section 360bbb-3(b)(1), unless the authorization is terminated  or revoked sooner.       Influenza A by PCR NEGATIVE NEGATIVE Final   Influenza B by PCR NEGATIVE NEGATIVE Final    Comment: (NOTE) The Xpert Xpress SARS-CoV-2/FLU/RSV plus assay is intended as an aid in the diagnosis of influenza from Nasopharyngeal swab specimens and should not be used as a sole basis for treatment. Nasal washings and aspirates are unacceptable for Xpert Xpress SARS-CoV-2/FLU/RSV testing.  Fact Sheet for Patients: BloggerCourse.com  Fact Sheet for Healthcare Providers: SeriousBroker.it  This test is not yet approved or cleared by the [place]  FDA and has been authorized for detection and/or diagnosis of SARS-CoV-2 by FDA under an Emergency Use Authorization (EUA). This EUA will remain in effect (meaning this test can be used) for the duration of the COVID-19 declaration under Section 564(b)(1) of the Act, 21 U.S.C. section 360bbb-3(b)(1), unless the authorization is terminated or revoked.     Resp Syncytial Virus by PCR NEGATIVE NEGATIVE Final    Comment: (NOTE) Fact Sheet for Patients: BloggerCourse.com  Fact Sheet for Healthcare Providers: SeriousBroker.it  This test is not yet approved or cleared by the [place]  FDA and has been authorized for detection and/or diagnosis of SARS-CoV-2 by FDA under an Emergency Use Authorization (EUA). This EUA will remain in effect (meaning this test can be used) for the duration of the COVID-19 declaration under Section  564(b)(1) of the Act, 21 U.S.C. section 360bbb-3(b)(1), unless the authorization is terminated or revoked.  Performed at [facility], [address] [place], [place] [address]   Blood Culture (routine x 2)     Status: None (Preliminary result)   Collection Time: 10/06/24  1:33 AM   Specimen: BLOOD  Result Value Ref Range Status   Specimen Description BLOOD BLOOD RIGHT ARM  Final   Special Requests   Final    BOTTLES DRAWN AEROBIC AND ANAEROBIC Blood Culture results may not be optimal due to an inadequate volume of blood received in culture bottles   Culture   Final    NO GROWTH < 12 HOURS Performed at [facility], [address], [place], [place] [address]    Report Status PENDING  Incomplete         Radiology Studies: DG Chest Portable 1 View Result Date: 10/05/2024 EXAM: 1 VIEW(S) XRAY OF THE CHEST 10/05/2024 10:33:00 PM COMPARISON: 09/19/2024 CLINICAL HISTORY: shortness of breath. Pt to ED BIB [facility]  [facility]S with c/o shortness of breath, found to be satting in the 70s with EMS, recent dx of pneumonia. Pt arrives on NRB. EMS attempted to wean down to 6L but pt desatting into the 80s. Lung sounds clear, baseline dementia. FINDINGS: LUNGS AND PLEURA: Patchy interstitial and airspace opacities right breast from 10 / 7 / 25. Persistent small bilateral pleural effusions. No pneumothorax. HEART AND MEDIASTINUM: Aortic calcification. No acute abnormality of the cardiac and mediastinal silhouettes. BONES AND SOFT TISSUES: No acute osseous abnormality. IMPRESSION: 1. Findings favor worsening multifocal pneumonia compared to 10 / 7 / 25. small bilateral pleural effusions. Electronically signed by: Norman Gatlin MD 10/05/2024 10:42 PM EDT RP Workstation: [id number]        Scheduled Meds:  atenolol   25 mg Oral Daily   donepezil   10 mg Oral QHS   enoxaparin (LOVENOX) injection  40 mg Subcutaneous Q24H   furosemide   20 mg Intravenous Daily   insulin aspart  0-15  Units Subcutaneous Q4H   lisinopril   5 mg Oral Daily   Continuous Infusions:  [START ON 10/07/2024] azithromycin      ceFEPime (MAXIPIME) IV     [START ON 10/08/2024] vancomycin       LOS: 1 day     Devaughn KATHEE Ban, MD [facility]   If 7PM-7AM, please contact night-coverage [email or web address] Password [email or web address] 10/06/2024, 8:29 AM

## 2024-10-06 NOTE — Assessment & Plan Note (Signed)
 Small bilateral pleural effusions Patient with shortness of breath, BNP 412 Received IV Lasix  in the ED Last EF 60 to 65% with G1 DD 07/25/2024 Daily weights with intake and output monitoring Continue atenolol  and lisinopril 

## 2024-10-06 NOTE — ED Notes (Signed)
 Patient cleaned up, new brief placed, and pure wick placed on patient at this time. Bed low and locked and call bell in reach.

## 2024-10-07 ENCOUNTER — Inpatient Hospital Stay

## 2024-10-07 DIAGNOSIS — J9622 Acute and chronic respiratory failure with hypercapnia: Secondary | ICD-10-CM | POA: Diagnosis not present

## 2024-10-07 DIAGNOSIS — J9621 Acute and chronic respiratory failure with hypoxia: Secondary | ICD-10-CM | POA: Diagnosis not present

## 2024-10-07 LAB — BASIC METABOLIC PANEL WITH GFR
Anion gap: 12 (ref 5–15)
BUN: 13 mg/dL (ref 8–23)
CO2: 34 mmol/L — ABNORMAL HIGH (ref 22–32)
Calcium: 8.5 mg/dL — ABNORMAL LOW (ref 8.9–10.3)
Chloride: 93 mmol/L — ABNORMAL LOW (ref 98–111)
Creatinine, Ser: 0.73 mg/dL (ref 0.44–1.00)
GFR, Estimated: >60 mL/min (ref >60)
Glucose, Bld: 90 mg/dL (ref 70–99)
Potassium: 3.2 mmol/L — ABNORMAL LOW (ref 3.5–5.1)
Sodium: 139 mmol/L (ref 135–145)

## 2024-10-07 LAB — GLUCOSE, CAPILLARY
Glucose-Capillary: 113 mg/dL — ABNORMAL HIGH (ref 70–99)
Glucose-Capillary: 148 mg/dL — ABNORMAL HIGH (ref 70–99)
Glucose-Capillary: 78 mg/dL (ref 70–99)
Glucose-Capillary: 87 mg/dL (ref 70–99)
Glucose-Capillary: 90 mg/dL (ref 70–99)
Glucose-Capillary: 91 mg/dL (ref 70–99)

## 2024-10-07 LAB — CBC
HCT: 36.9 % (ref 36.0–46.0)
Hemoglobin: 11.2 g/dL — ABNORMAL LOW (ref 12.0–15.0)
MCH: 28.4 pg (ref 26.0–34.0)
MCHC: 30.4 g/dL (ref 30.0–36.0)
MCV: 93.4 fL (ref 80.0–100.0)
Platelets: 211 K/uL (ref 150–400)
RBC: 3.95 MIL/uL (ref 3.87–5.11)
RDW: 14 % (ref 11.5–15.5)
WBC: 11.4 K/uL — ABNORMAL HIGH (ref 4.0–10.5)
nRBC: 0 % (ref 0.0–0.2)

## 2024-10-07 LAB — BLOOD GAS, VENOUS
Acid-Base Excess: 13.5 mmol/L — ABNORMAL HIGH (ref 0.0–2.0)
Bicarbonate: 41.5 mmol/L — ABNORMAL HIGH (ref 20.0–28.0)
O2 Content: 15 L/min
O2 Saturation: 47.6 %
Patient temperature: 37
pCO2, Ven: 67 mmHg — ABNORMAL HIGH (ref 44–60)
pH, Ven: 7.4 (ref 7.25–7.43)

## 2024-10-07 MED ORDER — POTASSIUM CHLORIDE 20 MEQ PO PACK
60.0000 meq | PACK | Freq: Once | ORAL | Status: AC
  Administered 2024-10-07: 60 meq via ORAL
  Filled 2024-10-07: qty 3

## 2024-10-07 MED ORDER — IOHEXOL 350 MG/ML SOLN
75.0000 mL | Freq: Once | INTRAVENOUS | Status: AC | PRN
  Administered 2024-10-07: 75 mL via INTRAVENOUS

## 2024-10-07 NOTE — Progress Notes (Signed)
 PROGRESS NOTE    [name] Griffin  FMW:[id number] DOB: Mar 15, 1928 DOA: 10/05/2024 PCP: Fernand Fredy RAMAN, MD     Brief Narrative:   [name] Griffin is a 89 y.o. female with medical history significant for Chronic HFpEF (EF 60 to 65%, G1 DD, 07/25/2024), stage IIIa CKD, HTN, DM, recently hospitalized (10/7 to 09/22/2024) with acute hypoxic respiratory failure secondary to multifocal pneumonia and CHF exacerbation, discharged on home O2 at 3 L, being admitted with worsening multifocal pneumonia, possible sepsis as well as CHF exacerbation.  EMS was called by patient's son who noted her to be very short of breath when trying to get out of bed.  On their arrival, she was tachycardic and hypoxic with O2 sats in the 70s.  She arrived on NRB, transitioned to HFNC on arrival. On arrival, satting at 98% on NRB maintained following transition to HFNC at 15 L/min.  Tachypneic to 30, afebrile respirations 30 VBG on 15 L high flow with pH 7.41, pCO2 67 WBC 11,000, lactic acid 1.7 Troponin 76 (recently in the 30s), and BNP 412, up from 305 a couple weeks prior CMP for the most part unremarkable EKG showed NSR at 87 Chest x-ray showed findings consistent with worsening multifocal pneumonia compared to 09/19/2024 along with small bilateral pleural effusions. Patient started on Maxipime, vancomycin and azithromycin , given a dose of IV Lasix    Assessment & Plan:   Principal Problem:   Acute on chronic respiratory failure with hypoxia and hypercapnia (HCC) Active Problems:   Obesity (BMI 30-39.9)   HCAP (healthcare-associated pneumonia)   Acute on chronic heart failure with preserved ejection fraction (HFpEF) (HCC)   Elevated troponin   Essential hypertension, benign   Type II diabetes mellitus with renal manifestations (HCC)   Chronic kidney disease, stage 3a (HCC)   Dementia (HCC)  # Acute hypoxic hypercapnic respiratory failure Possibly 2/2 hcap, chf likely also contributing. On 15 liter [place] on arrival  now weaned to 6. No significant dyspnea today. Negative PE eval on 10/7, will repeat as dimer elevated and no clear cut infectious symptoms. Was discharged earlier this month on 3 liters - wean o2 as able  # HCAP CXR showing signs multifocal pna. No significant cough however, no fevers, procal normal, covid and rvp neg. Does have mild leukocytosis.  Recent IV abx and hospitalization. Mrsa swab neg - urine antigens - follow blood cultures - not making sputum for culture - continue meropenem  # Report of tachycardia at home To 150s. Here sinus rhythm, no tachy - no events thus far on tele  # HFpEF with exacerbation # Tropinemia Bnp elevated. Preserved EF with mod phtn on tte in August. Trop 76>146>146. no EKG changes, not complaining of pain but demented. Likely demand. TTE with preserved EF, normal filling pressures - stop IV diuresis, consider resuming home oral furosemide  tomorrow  # HTN Bp soft - hold atenolol , lisinopril   # Gout - cont home allopurinol   # Dementia No behavioral disturbance - home donepezil   # Debility # History femur fracture Fracture August this year. Recent rehab, hasn't yet regained mobility - PT consult   # Goals of care Son considering transition to comfort care, wants to discuss with family    DVT prophylaxis: lovenox Code Status: dnr/dni Family Communication: son updated telephonically 10/24  Level of care: Progressive Status is: Inpatient Remains inpatient appropriate because: severity of illness    Consultants:  none  Procedures: none  Antimicrobials:  See above    Subjective:  Disoriented, no complaints  Objective: Vitals:   10/07/24 0922 10/07/24 0926 10/07/24 1130 10/07/24 1212  BP:    (!) 90/46  Pulse:    82  Resp:  (!) 21  15  Temp:    98.1 F (36.7 C)  TempSrc:      SpO2: 92% 93% 94% 96%  Weight:      Height:        Intake/Output Summary (Last 24 hours) at 10/07/2024 1346 Last data filed at 10/07/2024  0900 Gross per 24 hour  Intake 582.77 ml  Output 800 ml  Net -217.23 ml   Filed Weights   10/05/24 2205 10/07/24 0500  Weight: 69.8 kg 66.4 kg    Examination:  General exam: Appears calm   Respiratory system: scattered rales Cardiovascular system: S1 & S2 heard, RRR. Soft systolic murmur Gastrointestinal system: Abdomen is nondistended, soft and nontender. Central nervous system: Alert not oriented Extremities: Symmetric 5 x 5 power. No edema Skin: No visible rashes, lesions or ulcers Psychiatry: confused     Data Reviewed: I have personally reviewed following labs and imaging studies  CBC: Recent Labs  Lab 10/05/24 2225 10/06/24 0813 10/07/24 0522  WBC 11.2* 9.8 11.4*  NEUTROABS 8.5*  --   --   HGB 12.6 11.6* 11.2*  HCT 42.5 39.5 36.9  MCV 96.4 96.1 93.4  PLT 241 220 211   Basic Metabolic Panel: Recent Labs  Lab 10/05/24 2225 10/06/24 0813 10/07/24 0522  NA 141 143 139  K 3.4* 3.3* 3.2*  CL 98 98 93*  CO2 30 33* 34*  GLUCOSE 129* 95 90  BUN 20 17 13   CREATININE 0.91 0.81 0.73  CALCIUM 8.8* 8.5* 8.5*   GFR: Estimated Creatinine Clearance: 36.6 mL/min (by C-G formula based on SCr of 0.73 mg/dL). Liver Function Tests: Recent Labs  Lab 10/05/24 2225  AST 16  ALT 8  ALKPHOS 100  BILITOT 0.4  PROT 7.1  ALBUMIN 2.7*   No results for input(s): LIPASE, AMYLASE in the last 168 hours. No results for input(s): AMMONIA in the last 168 hours. Coagulation Profile: No results for input(s): INR, PROTIME in the last 168 hours. Cardiac Enzymes: No results for input(s): CKTOTAL, CKMB, CKMBINDEX, TROPONINI in the last 168 hours. BNP (last 3 results) No results for input(s): PROBNP in the last 8760 hours. HbA1C: No results for input(s): HGBA1C in the last 72 hours. CBG: Recent Labs  Lab 10/06/24 1949 10/06/24 2339 10/07/24 0503 10/07/24 0831 10/07/24 1215  GLUCAP 147* 73 87 90 113*   Lipid Profile: No results for input(s):  CHOL, HDL, LDLCALC, TRIG, CHOLHDL, LDLDIRECT in the last 72 hours. Thyroid Function Tests: No results for input(s): TSH, T4TOTAL, FREET4, T3FREE, THYROIDAB in the last 72 hours. Anemia Panel: No results for input(s): VITAMINB12, FOLATE, FERRITIN, TIBC, IRON, RETICCTPCT in the last 72 hours. Urine analysis: No results found for: COLORURINE, APPEARANCEUR, LABSPEC, PHURINE, GLUCOSEU, HGBUR, BILIRUBINUR, KETONESUR, PROTEINUR, UROBILINOGEN, NITRITE, LEUKOCYTESUR Sepsis Labs: @LABRCNTIP (procalcitonin:4,lacticidven:4)  ) Recent Results (from the past 240 hours)  Blood Culture (routine x 2)     Status: None (Preliminary result)   Collection Time: 10/05/24 10:25 PM   Specimen: BLOOD  Result Value Ref Range Status   Specimen Description BLOOD BLOOD RIGHT WRIST  Final   Special Requests   Final    BOTTLES DRAWN AEROBIC AND ANAEROBIC Blood Culture results may not be optimal due to an inadequate volume of blood received in culture bottles   Culture   Final  NO GROWTH 2 DAYS Performed at [facility], [address] [place], [place] [address]    Report Status PENDING  Incomplete  Resp panel by RT-PCR (RSV, Flu A&B, Covid) Anterior Nasal Swab     Status: None   Collection Time: 10/05/24 11:34 PM   Specimen: Anterior Nasal Swab  Result Value Ref Range Status   SARS Coronavirus 2 by RT PCR NEGATIVE NEGATIVE Final    Comment: (NOTE) SARS-CoV-2 target nucleic acids are NOT DETECTED.  The SARS-CoV-2 RNA is generally detectable in upper respiratory specimens during the acute phase of infection. The lowest concentration of SARS-CoV-2 viral copies this assay can detect is 138 copies/mL. A negative result does not preclude SARS-Cov-2 infection and should not be used as the sole basis for treatment or other patient management decisions. A negative result may occur with  improper specimen collection/handling, submission of specimen  other than nasopharyngeal swab, presence of viral mutation(s) within the areas targeted by this assay, and inadequate number of viral copies(<138 copies/mL). A negative result must be combined with clinical observations, patient history, and epidemiological information. The expected result is Negative.  Fact Sheet for Patients:  bloggercourse.com  Fact Sheet for Healthcare Providers:  seriousbroker.it  This test is no t yet approved or cleared by the [place]  FDA and  has been authorized for detection and/or diagnosis of SARS-CoV-2 by FDA under an Emergency Use Authorization (EUA). This EUA will remain  in effect (meaning this test can be used) for the duration of the COVID-19 declaration under Section 564(b)(1) of the Act, 21 U.S.C.section 360bbb-3(b)(1), unless the authorization is terminated  or revoked sooner.       Influenza A by PCR NEGATIVE NEGATIVE Final   Influenza B by PCR NEGATIVE NEGATIVE Final    Comment: (NOTE) The Xpert Xpress SARS-CoV-2/FLU/RSV plus assay is intended as an aid in the diagnosis of influenza from Nasopharyngeal swab specimens and should not be used as a sole basis for treatment. Nasal washings and aspirates are unacceptable for Xpert Xpress SARS-CoV-2/FLU/RSV testing.  Fact Sheet for Patients: bloggercourse.com  Fact Sheet for Healthcare Providers: seriousbroker.it  This test is not yet approved or cleared by the [place]  FDA and has been authorized for detection and/or diagnosis of SARS-CoV-2 by FDA under an Emergency Use Authorization (EUA). This EUA will remain in effect (meaning this test can be used) for the duration of the COVID-19 declaration under Section 564(b)(1) of the Act, 21 U.S.C. section 360bbb-3(b)(1), unless the authorization is terminated or revoked.     Resp Syncytial Virus by PCR NEGATIVE NEGATIVE Final     Comment: (NOTE) Fact Sheet for Patients: bloggercourse.com  Fact Sheet for Healthcare Providers: seriousbroker.it  This test is not yet approved or cleared by the [place]  FDA and has been authorized for detection and/or diagnosis of SARS-CoV-2 by FDA under an Emergency Use Authorization (EUA). This EUA will remain in effect (meaning this test can be used) for the duration of the COVID-19 declaration under Section 564(b)(1) of the Act, 21 U.S.C. section 360bbb-3(b)(1), unless the authorization is terminated or revoked.  Performed at [facility], [address] [place], [place] [address]   Blood Culture (routine x 2)     Status: None (Preliminary result)   Collection Time: 10/06/24  1:33 AM   Specimen: BLOOD  Result Value Ref Range Status   Specimen Description BLOOD BLOOD RIGHT ARM  Final   Special Requests   Final    BOTTLES DRAWN AEROBIC AND ANAEROBIC  Blood Culture results may not be optimal due to an inadequate volume of blood received in culture bottles   Culture   Final    NO GROWTH 1 DAY Performed at [facility], [address] [place], [place] [address]    Report Status PENDING  Incomplete  MRSA Next Gen by PCR, Nasal     Status: None   Collection Time: 10/06/24  3:30 PM   Specimen: Nasal Mucosa; Nasal Swab  Result Value Ref Range Status   MRSA by PCR Next Gen NOT DETECTED NOT DETECTED Final    Comment: (NOTE) The GeneXpert MRSA Assay (FDA approved for NASAL specimens only), is one component of a comprehensive MRSA colonization surveillance program. It is not intended to diagnose MRSA infection nor to guide or monitor treatment for MRSA infections. Test performance is not FDA approved in patients less than 63 years old. Performed at [facility], [address] [place], [place] [address]   Respiratory (~20 pathogens) panel by PCR     Status: None   Collection Time: 10/06/24   3:46 PM   Specimen: Nasopharyngeal Swab; Respiratory  Result Value Ref Range Status   Adenovirus NOT DETECTED NOT DETECTED Final   Coronavirus 229E NOT DETECTED NOT DETECTED Final    Comment: (NOTE) The Coronavirus on the Respiratory Panel, DOES NOT test for the novel  Coronavirus (2019 nCoV)    Coronavirus HKU1 NOT DETECTED NOT DETECTED Final   Coronavirus NL63 NOT DETECTED NOT DETECTED Final   Coronavirus OC43 NOT DETECTED NOT DETECTED Final   Metapneumovirus NOT DETECTED NOT DETECTED Final   Rhinovirus / Enterovirus NOT DETECTED NOT DETECTED Final   Influenza A NOT DETECTED NOT DETECTED Final   Influenza B NOT DETECTED NOT DETECTED Final   Parainfluenza Virus 1 NOT DETECTED NOT DETECTED Final   Parainfluenza Virus 2 NOT DETECTED NOT DETECTED Final   Parainfluenza Virus 3 NOT DETECTED NOT DETECTED Final   Parainfluenza Virus 4 NOT DETECTED NOT DETECTED Final   Respiratory Syncytial Virus NOT DETECTED NOT DETECTED Final   Bordetella pertussis NOT DETECTED NOT DETECTED Final   Bordetella Parapertussis NOT DETECTED NOT DETECTED Final   Chlamydophila pneumoniae NOT DETECTED NOT DETECTED Final   Mycoplasma pneumoniae NOT DETECTED NOT DETECTED Final    Comment: Performed at [facility], [address] [place], [place] [address]         Radiology Studies: ECHOCARDIOGRAM LIMITED Result Date: 10/06/2024    ECHOCARDIOGRAM LIMITED REPORT   Patient Name:   [name] Griffin [facility] Date of Exam: 10/06/2024 Medical Rec #:  [id number]       Height:       60.0 in Accession #:    [id number]      Weight:       153.9 lb Date of Birth:  Nov 14, 1928       BSA:          1.670 m Patient Age:    89 years        BP:           122/50 mmHg Patient Gender: F               HR:           64 bpm. Exam Location:  [facility] Procedure: Limited Echo, Color Doppler and Intracardiac Opacification Agent            (Both Spectral and Color Flow Doppler were utilized during  procedure). Indications:     CHF-Acute  Diastolic I50.31  History:         Patient has prior history of Echocardiogram examinations, most                  recent 07/25/2024. CHF.  Sonographer:     Ashley McNeely-Sloane Referring Phys:  [id number] [facility] Alexa Blish Diagnosing Phys: Keller Paterson IMPRESSIONS  1. Left ventricular ejection fraction, by estimation, is 60 to 65%. The left ventricle has normal function. The left ventricle has no regional wall motion abnormalities.  2. Right ventricular systolic function is normal. The right ventricular size is normal.  3. The mitral valve is normal in structure. Trivial mitral valve regurgitation.  4. The aortic valve is tricuspid. Aortic valve regurgitation is not visualized.  5. The inferior vena cava is normal in size with greater than 50% respiratory variability, suggesting right atrial pressure of 3 mmHg. FINDINGS  Left Ventricle: Left ventricular ejection fraction, by estimation, is 60 to 65%. The left ventricle has normal function. The left ventricle has no regional wall motion abnormalities. Definity contrast agent was given IV to delineate the left ventricular  endocardial borders. The left ventricular internal cavity size was normal in size. Right Ventricle: The right ventricular size is normal. No increase in right ventricular wall thickness. Right ventricular systolic function is normal. Pericardium: There is no evidence of pericardial effusion. Mitral Valve: The mitral valve is normal in structure. Trivial mitral valve regurgitation. Tricuspid Valve: The tricuspid valve is normal in structure. Tricuspid valve regurgitation is mild. Aortic Valve: The aortic valve is tricuspid. Aortic valve regurgitation is not visualized. Pulmonic Valve: The pulmonic valve was not well visualized. Pulmonic valve regurgitation is not visualized. Aorta: The aortic root is normal in size and structure. Venous: The inferior vena cava is normal in size with greater than 50% respiratory variability, suggesting right atrial  pressure of 3 mmHg. Additional Comments: Color Doppler performed.   LV Volumes (MOD) LV vol d, MOD A2C: 46.8 ml LV vol d, MOD A4C: 36.5 ml LV vol s, MOD A2C: 14.8 ml LV vol s, MOD A4C: 14.1 ml LV SV MOD A2C:     32.0 ml LV SV MOD A4C:     36.5 ml LV SV MOD BP:      27.1 ml Keller Paterson Electronically signed by Keller Paterson Signature Date/Time: 10/06/2024/2:57:57 PM    Final    DG Chest Portable 1 View Result Date: 10/05/2024 EXAM: 1 VIEW(S) XRAY OF THE CHEST 10/05/2024 10:33:00 PM COMPARISON: 09/19/2024 CLINICAL HISTORY: shortness of breath. Pt to ED BIB [facility] with c/o shortness of breath, found to be satting in the 70s with EMS, recent dx of pneumonia. Pt arrives on NRB. EMS attempted to wean down to 6L but pt desatting into the 80s. Lung sounds clear, baseline dementia. FINDINGS: LUNGS AND PLEURA: Patchy interstitial and airspace opacities right breast from 10 / 7 / 25. Persistent small bilateral pleural effusions. No pneumothorax. HEART AND MEDIASTINUM: Aortic calcification. No acute abnormality of the cardiac and mediastinal silhouettes. BONES AND SOFT TISSUES: No acute osseous abnormality. IMPRESSION: 1. Findings favor worsening multifocal pneumonia compared to 10 / 7 / 25. small bilateral pleural effusions. Electronically signed by: Norman Gatlin MD 10/05/2024 10:42 PM EDT RP Workstation: [id number]        Scheduled Meds:  allopurinol   100 mg Oral Daily   donepezil   10 mg Oral QHS   enoxaparin (LOVENOX) injection  40 mg Subcutaneous Q24H   insulin  aspart  0-15 Units Subcutaneous Q4H   potassium chloride  60 mEq Oral Once   Continuous Infusions:  meropenem (MERREM) IV Stopped (10/07/24 1209)     LOS: 2 days     Devaughn KATHEE Ban, MD [facility]   If 7PM-7AM, please contact night-coverage [email or web address] Password [email or web address] 10/07/2024, 1:46 PM

## 2024-10-07 NOTE — Evaluation (Signed)
 Physical Therapy Evaluation Patient Details Name: [name] Griffin MRN: [id number] DOB: 03-Jun-1928 Today's Date: 10/07/2024  History of Present Illness  89 y/o female presented to ED on 10/05/24 for SOB and hypoxia. Recent hospitalization 10/7-10/10/25 for acute hypoxic respiratory failure 2/2 pneumonia and CHF exacerbation and d/c'ed home on 3L O2. Readmitted for acute hypoxic hypercapnic respiratory failure. PMH significant for HTN, HLD, diet-controlled DM, gout, CKD-3A, memory loss, recent L hip fx s/p ORIF.  Clinical Impression  Patient admitted with the above. PTA, patient lives with son and son's fiance and was requiring assistance for ADLs and ambulating very short distance with rollator since earlier in October admission. Patient currently requiring 8L O2 HFNC with spO2 97% at rest and drops to 87% with minimal activity. Required maxA for bed mobility this date. Tactile cues at chin to remind patient to breathe through nose. Patient declining further mobility due to exhaustion/fatigue. Patient will benefit from skilled PT services during acute stay to address listed deficits. Patient will benefit from ongoing therapy at discharge to maximize functional independence and safety.       If plan is discharge home, recommend the following: Two people to help with walking and/or transfers;A lot of help with bathing/dressing/bathroom;Assistance with cooking/housework;Assistance with feeding;Assist for transportation;Help with stairs or ramp for entrance   Can travel by private vehicle   No    Equipment Recommendations None recommended by PT  Recommendations for Other Services       Functional Status Assessment Patient has had a recent decline in their functional status and demonstrates the ability to make significant improvements in function in a reasonable and predictable amount of time.     Precautions / Restrictions Precautions Precautions: Fall Recall of Precautions/Restrictions:  Impaired Restrictions Weight Bearing Restrictions Per Provider Order: No      Mobility  Bed Mobility Overal bed mobility: Needs Assistance Bed Mobility: Supine to Sit, Sit to Supine     Supine to sit: Max assist Sit to supine: Max assist   General bed mobility comments: On 8L O2, spO2 97% prior to mobility. Once seated on EOB, spO2 86% on 8L. Patient with 3/4 DOE and requesting to defer further mobility. Tactile cues at chin to remind patient to breathe through nose    Transfers                        Ambulation/Gait                  Stairs            Wheelchair Mobility     Tilt Bed    Modified Rankin (Stroke Patients Only)       Balance Overall balance assessment: Needs assistance Sitting-balance support: No upper extremity supported Sitting balance-Leahy Scale: Fair                                       Pertinent Vitals/Pain Pain Assessment Pain Assessment: No/denies pain    Home Living Family/patient expects to be discharged to:: Private residence Living Arrangements: Children Available Help at Discharge: Family;Available 24 hours/day Type of Home: Mobile home Home Access: Ramped entrance (+1 clearance into front door)       Home Layout: One level Home Equipment: Rollator (4 wheels);BSC/3in1;Grab bars - tub/shower;Wheelchair - manual      Prior Function Prior Level of Function : Needs assist  Mobility Comments: Prior to last admission, patient was ambulatory for short distances in the home with rollator. Recently, unable to ambulate with rollator without assistance and very short distance. Hx of fall ADLs Comments: Pt's family has been helping her with ADLs since return from SNF     Extremity/Trunk Assessment   Upper Extremity Assessment Upper Extremity Assessment: Generalized weakness    Lower Extremity Assessment Lower Extremity Assessment: Generalized weakness    Cervical / Trunk  Assessment Cervical / Trunk Assessment: Kyphotic  Communication   Communication Communication: Impaired Factors Affecting Communication: Hearing impaired    Cognition Arousal: Alert Behavior During Therapy: WFL for tasks assessed/performed   PT - Cognitive impairments: History of cognitive impairments                         Following commands: Impaired Following commands impaired: Follows one step commands with increased time     Cueing       General Comments      Exercises     Assessment/Plan    PT Assessment Patient needs continued PT services  PT Problem List Decreased strength;Decreased activity tolerance;Decreased balance;Decreased mobility;Cardiopulmonary status limiting activity       PT Treatment Interventions DME instruction;Gait training;Functional mobility training;Therapeutic activities;Therapeutic exercise;Balance training;Neuromuscular re-education;Cognitive remediation;Patient/family education    PT Goals (Current goals can be found in the Care Plan section)  Acute Rehab PT Goals Patient Stated Goal: to go home PT Goal Formulation: With patient/family Time For Goal Achievement: 10/21/24 Potential to Achieve Goals: Fair    Frequency Min 2X/week     Co-evaluation               AM-PAC PT 6 Clicks Mobility  Outcome Measure Help needed turning from your back to your side while in a flat bed without using bedrails?: A Lot Help needed moving from lying on your back to sitting on the side of a flat bed without using bedrails?: A Lot Help needed moving to and from a bed to a chair (including a wheelchair)?: A Lot Help needed standing up from a chair using your arms (e.g., wheelchair or bedside chair)?: A Lot Help needed to walk in hospital room?: A Lot Help needed climbing 3-5 steps with a railing? : Total 6 Click Score: 11    End of Session Equipment Utilized During Treatment: Oxygen Activity Tolerance: Patient limited by  fatigue Patient left: in bed;with call bell/phone within reach;with bed alarm set;with family/visitor present Nurse Communication: Mobility status PT Visit Diagnosis: Unsteadiness on feet (R26.81);Muscle weakness (generalized) (M62.81);History of falling (Z91.81);Difficulty in walking, not elsewhere classified (R26.2)    Time: 8950-8887 PT Time Calculation (min) (ACUTE ONLY): 23 min   Charges:   PT Evaluation $PT Eval Moderate Complexity: 1 Mod PT Treatments $Therapeutic Activity: 8-22 mins PT General Charges $$ ACUTE PT VISIT: 1 Visit         Maryanne Finder, PT, DPT Physical Therapist - [facility]  [facility]   Sotero Brinkmeyer A Alveda Vanhorne 10/07/2024, 2:14 PM

## 2024-10-08 DIAGNOSIS — J9621 Acute and chronic respiratory failure with hypoxia: Secondary | ICD-10-CM | POA: Diagnosis not present

## 2024-10-08 DIAGNOSIS — J9622 Acute and chronic respiratory failure with hypercapnia: Secondary | ICD-10-CM | POA: Diagnosis not present

## 2024-10-08 LAB — BLOOD GAS, ARTERIAL
Acid-Base Excess: 17.2 mmol/L — ABNORMAL HIGH (ref 0.0–2.0)
Bicarbonate: 43.1 mmol/L — ABNORMAL HIGH (ref 20.0–28.0)
FIO2: 75 %
O2 Content: 55 L/min
O2 Saturation: 92.4 %
Patient temperature: 37
pCO2 arterial: 54 mmHg — ABNORMAL HIGH (ref 32–48)
pH, Arterial: 7.51 — ABNORMAL HIGH (ref 7.35–7.45)
pO2, Arterial: 61 mmHg — ABNORMAL LOW (ref 83–108)

## 2024-10-08 LAB — GLUCOSE, CAPILLARY
Glucose-Capillary: 89 mg/dL (ref 70–99)
Glucose-Capillary: 113 mg/dL — ABNORMAL HIGH (ref 70–99)
Glucose-Capillary: 111 mg/dL — ABNORMAL HIGH (ref 70–99)
Glucose-Capillary: 192 mg/dL — ABNORMAL HIGH (ref 70–99)
Glucose-Capillary: 189 mg/dL — ABNORMAL HIGH (ref 70–99)

## 2024-10-08 LAB — LEGIONELLA PNEUMOPHILA SEROGP 1 UR AG: L. pneumophila Serogp 1 Ur Ag: NEGATIVE

## 2024-10-08 LAB — BASIC METABOLIC PANEL WITH GFR
Anion gap: 9 (ref 5–15)
BUN: 15 mg/dL (ref 8–23)
CO2: 34 mmol/L — ABNORMAL HIGH (ref 22–32)
Calcium: 8.6 mg/dL — ABNORMAL LOW (ref 8.9–10.3)
Chloride: 95 mmol/L — ABNORMAL LOW (ref 98–111)
Creatinine, Ser: 0.77 mg/dL (ref 0.44–1.00)
GFR, Estimated: >60 mL/min (ref >60)
Glucose, Bld: 94 mg/dL (ref 70–99)
Potassium: 3.8 mmol/L (ref 3.5–5.1)
Sodium: 138 mmol/L (ref 135–145)

## 2024-10-08 LAB — BLOOD GAS, VENOUS

## 2024-10-08 MED ORDER — FUROSEMIDE 10 MG/ML IJ SOLN
40.0000 mg | Freq: Once | INTRAMUSCULAR | Status: AC
  Administered 2024-10-08: 40 mg via INTRAVENOUS
  Filled 2024-10-08: qty 4

## 2024-10-08 MED ORDER — METHYLPREDNISOLONE SODIUM SUCC 125 MG IJ SOLR
125.0000 mg | Freq: Once | INTRAMUSCULAR | Status: AC
  Administered 2024-10-08: 125 mg via INTRAVENOUS
  Filled 2024-10-08: qty 2

## 2024-10-08 NOTE — Progress Notes (Signed)
 SIGNIFICANT EVENT NOTE  Includes , acute worsening of clinical condition, need for bedside evaluation/urgent imaging, labs, consults, therapeutics  Event: Worsening respiratory failure and increased O2 requirement  NAME: [name] Griffin MRN: [id number] DOB : 05/26/1928    Reason for event   this patient was SPO2 80%, RT increased HFNC Fio2 from 55% to 75%, still on 55Lpm and now still desaturating 87-90%      Brief pertinent history/background Per today's progress note--briefly: Acute hypoxic hypercapnic respiratory failure...multifactorial --worsening hypoxia now on high flow.   --Son does not want to escalate current scope of care beyond trial of bipap, declines offer of thoracentesis --Son considering transition to comfort care, wants to discuss with family, brother is on a cruise will be back tomorrow, likely transition to comfort care tomorrow.   Initial patient rapid assessment Patient evaluated at bedside.  Respiratory therapist and bedside nurse present Review of Systems  Respiratory:  Positive for shortness of breath.   Cardiovascular:  Negative for chest pain.  Musculoskeletal:        Denies leg pain   Date/Time Temp Pulse Resp BP SpO2 O2 Device O2 Flow Rate (L/min) FiO2 (%)  10/08/24 2013 -- -- 26 Abnormal  -- -- -- -- --  10/08/24 2010 -- -- -- -- 91 % HFNC 55 L/min 75 %  10/08/24 1952 -- -- 27 Abnormal  -- 88 % Abnormal  HHFNC 55 L/min 75 %   10/08/24 19:46:53 97.6 F (36.4 C) 100 19 132/69 80 % Abnormal  -- -- --  10/08/24 1619 -- -- -- -- 93 % HFNC 55 L/min  55 %   10/08/24 15:31:31 97.8 F (36.6 C) 90 18 125/101 Abnormal  94 % HFNC -- --  10/08/24 15:31:31 -- -- -- -- -- -- 60 L/min 65 %   Physical Exam Vitals and nursing note reviewed.  Constitutional:      General: She is awake. She is not in acute distress.    Comments: Patient awake and alert, pleasant, trying to converse but with conversational tachypnea.  Hard of hearing  HENT:     Head:  Normocephalic and atraumatic.  Cardiovascular:     Rate and Rhythm: Regular rhythm. Tachycardia present.     Heart sounds: Normal heart sounds.  Pulmonary:     Effort: Tachypnea present.     Breath sounds: Decreased air movement present. Wheezing present.  Abdominal:     Palpations: Abdomen is soft.     Tenderness: There is no abdominal tenderness.  Neurological:     Mental Status: She is alert. Mental status is at baseline.      Workup ABG    Component Value Date/Time   PHART 7.51 (H) 10/08/2024 2126   PCO2ART 54 (H) 10/08/2024 2126   PO2ART 61 (L) 10/08/2024 2126   HCO3 43.1 (H) 10/08/2024 2126   O2SAT 92.4 10/08/2024 2126     Final assessment     Assessment:  Worsening multifactorial respiratory failure Advanced age Ongoing goals of care discussion  Plan: Discussed with respiratory/nursing-patient currently on heated high flow.   BiPAP if needed to get through the night If patient deteriorates in spite of BiPAP will alert son regarding proceeding with comfort care measures Patient at the present time is tachypneic but does not look severely distressed, but will continue to monitor closely    Patient is at high risk of further clinical deterioration and high risk of not surviving the night or to discharge  CRITICAL CARE Performed by: Delayne LULLA Solian   Total critical care time: 35 minutes  Critical care time was exclusive of separately billable procedures and treating other patients.  Critical care was necessary to treat or prevent imminent or life-threatening deterioration.  Critical care was time spent personally by me on the following activities: development of treatment plan with patient and/or surrogate as well as nursing, discussions with consultants, evaluation of patient's response to treatment, examination of patient, obtaining history from patient or surrogate, ordering and performing treatments and interventions, ordering and review of laboratory  studies, ordering and review of radiographic studies, pulse oximetry and re-evaluation of patient's condition.

## 2024-10-08 NOTE — Plan of Care (Signed)
  Problem: Coping: Goal: Ability to adjust to condition or change in health will improve Outcome: Progressing   Problem: Fluid Volume: Goal: Ability to maintain a balanced intake and output will improve Outcome: Progressing   Problem: Health Behavior/Discharge Planning: Goal: Ability to identify and utilize available resources and services will improve Outcome: Progressing Goal: Ability to manage health-related needs will improve Outcome: Progressing   Problem: Metabolic: Goal: Ability to maintain appropriate glucose levels will improve Outcome: Progressing   Problem: Nutritional: Goal: Maintenance of adequate nutrition will improve Outcome: Progressing Goal: Progress toward achieving an optimal weight will improve Outcome: Progressing   Problem: Skin Integrity: Goal: Risk for impaired skin integrity will decrease Outcome: Progressing   Problem: Tissue Perfusion: Goal: Adequacy of tissue perfusion will improve Outcome: Progressing   Problem: Education: Goal: Knowledge of General Education information will improve Description: Including pain rating scale, medication(s)/side effects and non-pharmacologic comfort measures Outcome: Progressing   Problem: Health Behavior/Discharge Planning: Goal: Ability to manage health-related needs will improve Outcome: Progressing   Problem: Clinical Measurements: Goal: Ability to maintain clinical measurements within normal limits will improve Outcome: Progressing Goal: Will remain free from infection Outcome: Progressing Goal: Diagnostic test results will improve Outcome: Progressing Goal: Respiratory complications will improve Outcome: Progressing Goal: Cardiovascular complication will be avoided Outcome: Progressing   Problem: Activity: Goal: Risk for activity intolerance will decrease Outcome: Progressing   Problem: Nutrition: Goal: Adequate nutrition will be maintained Outcome: Progressing   Problem: Coping: Goal:  Level of anxiety will decrease Outcome: Progressing   Problem: Elimination: Goal: Will not experience complications related to bowel motility Outcome: Progressing Goal: Will not experience complications related to urinary retention Outcome: Progressing   Problem: Pain Managment: Goal: General experience of comfort will improve and/or be controlled Outcome: Progressing   Problem: Safety: Goal: Ability to remain free from injury will improve Outcome: Progressing   Problem: Skin Integrity: Goal: Risk for impaired skin integrity will decrease Outcome: Progressing   Problem: Education: Goal: Ability to demonstrate management of disease process will improve Outcome: Progressing Goal: Ability to verbalize understanding of medication therapies will improve Outcome: Progressing Goal: Individualized Educational Video(s) Outcome: Progressing   Problem: Activity: Goal: Capacity to carry out activities will improve Outcome: Progressing   Problem: Cardiac: Goal: Ability to achieve and maintain adequate cardiopulmonary perfusion will improve Outcome: Progressing   Problem: Activity: Goal: Ability to tolerate increased activity will improve Outcome: Progressing   Problem: Clinical Measurements: Goal: Ability to maintain a body temperature in the normal range will improve Outcome: Progressing   Problem: Respiratory: Goal: Ability to maintain adequate ventilation will improve Outcome: Progressing Goal: Ability to maintain a clear airway will improve Outcome: Progressing   Problem: Education: Goal: Ability to describe self-care measures that may prevent or decrease complications (Diabetes Survival Skills Education) will improve Outcome: Not Progressing Goal: Individualized Educational Video(s) Outcome: Not Progressing

## 2024-10-08 NOTE — Progress Notes (Addendum)
 PROGRESS NOTE    [name] Griffin  FMW:[id number] DOB: [birth year] DOA: 10/05/2024 PCP: Fernand Fredy RAMAN, MD     Brief Narrative:   [name] Griffin is a 89 y.o. female with medical history significant for Chronic HFpEF (EF 60 to 65%, G1 DD, 07/25/2024), stage IIIa CKD, HTN, DM, recently hospitalized (10/7 to 09/22/2024) with acute hypoxic respiratory failure secondary to multifocal pneumonia and CHF exacerbation, discharged on home O2 at 3 L, being admitted with worsening multifocal pneumonia, possible sepsis as well as CHF exacerbation.  EMS was called by patient's son who noted her to be very short of breath when trying to get out of bed.  On their arrival, she was tachycardic and hypoxic with O2 sats in the 70s.  She arrived on NRB, transitioned to HFNC on arrival. On arrival, satting at 98% on NRB maintained following transition to HFNC at 15 L/min.  Tachypneic to 30, afebrile respirations 30 VBG on 15 L high flow with pH 7.41, pCO2 67 WBC 11,000, lactic acid 1.7 Troponin 76 (recently in the 30s), and BNP 412, up from 305 a couple weeks prior CMP for the most part unremarkable EKG showed NSR at 87 Chest x-ray showed findings consistent with worsening multifocal pneumonia compared to 09/19/2024 along with small bilateral pleural effusions. Patient started on Maxipime, vancomycin and azithromycin , given a dose of IV Lasix    Assessment & Plan:   Principal Problem:   Acute on chronic respiratory failure with hypoxia and hypercapnia (HCC) Active Problems:   Obesity (BMI 30-39.9)   HCAP (healthcare-associated pneumonia)   Acute on chronic heart failure with preserved ejection fraction (HFpEF) (HCC)   Elevated troponin   Essential hypertension, benign   Type II diabetes mellitus with renal manifestations (HCC)   Chronic kidney disease, stage 3a (HCC)   Dementia (HCC)  # Acute hypoxic hypercapnic respiratory failure CTA no PE but shows multifocal infiltrates, infection/inflammation/ards on  ddx, also with mild/mod effusions. On 15 liter Johnson on arrival yesterday weaned to 6 but overnight worsening hypoxia now on high flow.  Son does not want to escalate current scope of care beyond trial of bipap, declines offer of thoracentesis - hfnc, bipap trial as needed - check vbg - methylpred 125 IV x1  # HCAP CXR showing signs multifocal pna. No significant cough however, no fevers, procal normal, covid and rvp neg. Does have mild leukocytosis.  Recent IV abx and hospitalization. Mrsa swab neg - urine antigens - follow blood cultures - not making sputum for culture - continue meropenem  # Report of tachycardia at home To 150s. Here sinus rhythm, no tachy - no events thus far on tele  # HFpEF with exacerbation # Tropinemia Bnp elevated. Preserved EF with mod phtn on tte in August. Trop 76>146>146. no EKG changes, not complaining of pain but demented. Likely demand. TTE with preserved EF, normal filling pressures - resume lasix  40 iv bid as kidney function allows  # HTN Bp soft - hold atenolol , lisinopril   # Gout - cont home allopurinol   # Dementia No behavioral disturbance - home donepezil   # Debility # History femur fracture Fracture August this year. Recent rehab, hasn't yet regained mobility - PT consulted  # Goals of care Son considering transition to comfort care, wants to discuss with family, brother is on a cruise will be back tomorrow, likely transition to comfort care tomorrow. 10 min advanced care planning today.    DVT prophylaxis: lovenox Code Status: dnr/dni, confirmed with son at  bedside 10/26 Family Communication: son updated at bedside 10/26  Level of care: Progressive Status is: Inpatient Remains inpatient appropriate because: severity of illness    Consultants:  none  Procedures: none  Antimicrobials:  See above    Subjective: Disoriented, no complaints  Objective: Vitals:   10/08/24 0910 10/08/24 0913 10/08/24 0938 10/08/24 0953   BP:      Pulse:      Resp: (!) 28 (!) 28    Temp:      TempSrc:      SpO2:  (S) (!) 87% (S) (!) 88% (S) 100%  Weight:      Height:        Intake/Output Summary (Last 24 hours) at 10/08/2024 1019 Last data filed at 10/08/2024 0900 Gross per 24 hour  Intake 315.23 ml  Output 2300 ml  Net -1984.77 ml   Filed Weights   10/05/24 2205 10/07/24 0500 10/08/24 0500  Weight: 69.8 kg 66.4 kg 64.2 kg    Examination:  General exam: Appears calm   Respiratory system: scattered rales and rhonchi, tachypneic Cardiovascular system: S1 & S2 heard, RRR. Soft systolic murmur Gastrointestinal system: Abdomen is nondistended, soft and nontender. Central nervous system: Alert not oriented Extremities: Symmetric 5 x 5 power. No edema Skin: No visible rashes, lesions or ulcers Psychiatry: confused     Data Reviewed: I have personally reviewed following labs and imaging studies  CBC: Recent Labs  Lab 10/05/24 2225 10/06/24 0813 10/07/24 0522  WBC 11.2* 9.8 11.4*  NEUTROABS 8.5*  --   --   HGB 12.6 11.6* 11.2*  HCT 42.5 39.5 36.9  MCV 96.4 96.1 93.4  PLT 241 220 211   Basic Metabolic Panel: Recent Labs  Lab 10/05/24 2225 10/06/24 0813 10/07/24 0522 10/08/24 0527  NA 141 143 139 138  K 3.4* 3.3* 3.2* 3.8  CL 98 98 93* 95*  CO2 30 33* 34* 34*  GLUCOSE 129* 95 90 94  BUN 20 17 13 15   CREATININE 0.91 0.81 0.73 0.77  CALCIUM 8.8* 8.5* 8.5* 8.6*   GFR: Estimated Creatinine Clearance: 36 mL/min (by C-G formula based on SCr of 0.77 mg/dL). Liver Function Tests: Recent Labs  Lab 10/05/24 2225  AST 16  ALT 8  ALKPHOS 100  BILITOT 0.4  PROT 7.1  ALBUMIN 2.7*   No results for input(s): LIPASE, AMYLASE in the last 168 hours. No results for input(s): AMMONIA in the last 168 hours. Coagulation Profile: No results for input(s): INR, PROTIME in the last 168 hours. Cardiac Enzymes: No results for input(s): CKTOTAL, CKMB, CKMBINDEX, TROPONINI in the last 168  hours. BNP (last 3 results) No results for input(s): PROBNP in the last 8760 hours. HbA1C: No results for input(s): HGBA1C in the last 72 hours. CBG: Recent Labs  Lab 10/07/24 1758 10/07/24 1952 10/07/24 2345 10/08/24 0335 10/08/24 0911  GLUCAP 91 148* 78 89 113*   Lipid Profile: No results for input(s): CHOL, HDL, LDLCALC, TRIG, CHOLHDL, LDLDIRECT in the last 72 hours. Thyroid Function Tests: No results for input(s): TSH, T4TOTAL, FREET4, T3FREE, THYROIDAB in the last 72 hours. Anemia Panel: No results for input(s): VITAMINB12, FOLATE, FERRITIN, TIBC, IRON, RETICCTPCT in the last 72 hours. Urine analysis: No results found for: COLORURINE, APPEARANCEUR, LABSPEC, PHURINE, GLUCOSEU, HGBUR, BILIRUBINUR, KETONESUR, PROTEINUR, UROBILINOGEN, NITRITE, LEUKOCYTESUR Sepsis Labs: @LABRCNTIP (procalcitonin:4,lacticidven:4)  ) Recent Results (from the past 240 hours)  Blood Culture (routine x 2)     Status: None (Preliminary result)   Collection Time: 10/05/24 10:25 PM  Specimen: BLOOD  Result Value Ref Range Status   Specimen Description BLOOD BLOOD RIGHT WRIST  Final   Special Requests   Final    BOTTLES DRAWN AEROBIC AND ANAEROBIC Blood Culture results may not be optimal due to an inadequate volume of blood received in culture bottles   Culture   Final    NO GROWTH 3 DAYS Performed at [facility], [address], [place], [place] [address]    Report Status PENDING  Incomplete  Resp panel by RT-PCR (RSV, Flu A&B, Covid) Anterior Nasal Swab     Status: None   Collection Time: 10/05/24 11:34 PM   Specimen: Anterior Nasal Swab  Result Value Ref Range Status   SARS Coronavirus 2 by RT PCR NEGATIVE NEGATIVE Final    Comment: (NOTE) SARS-CoV-2 target nucleic acids are NOT DETECTED.  The SARS-CoV-2 RNA is generally detectable in upper respiratory specimens during the acute phase of infection. The  lowest concentration of SARS-CoV-2 viral copies this assay can detect is 138 copies/mL. A negative result does not preclude SARS-Cov-2 infection and should not be used as the sole basis for treatment or other patient management decisions. A negative result may occur with  improper specimen collection/handling, submission of specimen other than nasopharyngeal swab, presence of viral mutation(s) within the areas targeted by this assay, and inadequate number of viral copies(<138 copies/mL). A negative result must be combined with clinical observations, patient history, and epidemiological information. The expected result is Negative.  Fact Sheet for Patients:  bloggercourse.com  Fact Sheet for Healthcare Providers:  seriousbroker.it  This test is no t yet approved or cleared by the [place]  FDA and  has been authorized for detection and/or diagnosis of SARS-CoV-2 by FDA under an Emergency Use Authorization (EUA). This EUA will remain  in effect (meaning this test can be used) for the duration of the COVID-19 declaration under Section 564(b)(1) of the Act, 21 U.S.C.section 360bbb-3(b)(1), unless the authorization is terminated  or revoked sooner.       Influenza A by PCR NEGATIVE NEGATIVE Final   Influenza B by PCR NEGATIVE NEGATIVE Final    Comment: (NOTE) The Xpert Xpress SARS-CoV-2/FLU/RSV plus assay is intended as an aid in the diagnosis of influenza from Nasopharyngeal swab specimens and should not be used as a sole basis for treatment. Nasal washings and aspirates are unacceptable for Xpert Xpress SARS-CoV-2/FLU/RSV testing.  Fact Sheet for Patients: bloggercourse.com  Fact Sheet for Healthcare Providers: seriousbroker.it  This test is not yet approved or cleared by the [place]  FDA and has been authorized for detection and/or diagnosis of SARS-CoV-2 by FDA under  an Emergency Use Authorization (EUA). This EUA will remain in effect (meaning this test can be used) for the duration of the COVID-19 declaration under Section 564(b)(1) of the Act, 21 U.S.C. section 360bbb-3(b)(1), unless the authorization is terminated or revoked.     Resp Syncytial Virus by PCR NEGATIVE NEGATIVE Final    Comment: (NOTE) Fact Sheet for Patients: bloggercourse.com  Fact Sheet for Healthcare Providers: seriousbroker.it  This test is not yet approved or cleared by the [place]  FDA and has been authorized for detection and/or diagnosis of SARS-CoV-2 by FDA under an Emergency Use Authorization (EUA). This EUA will remain in effect (meaning this test can be used) for the duration of the COVID-19 declaration under Section 564(b)(1) of the Act, 21 U.S.C. section 360bbb-3(b)(1), unless the authorization is terminated or revoked.  Performed at [facility], [address] [place],  [place] [address]   Blood Culture (routine x 2)     Status: None (Preliminary result)   Collection Time: 10/06/24  1:33 AM   Specimen: BLOOD  Result Value Ref Range Status   Specimen Description BLOOD BLOOD RIGHT ARM  Final   Special Requests   Final    BOTTLES DRAWN AEROBIC AND ANAEROBIC Blood Culture results may not be optimal due to an inadequate volume of blood received in culture bottles   Culture   Final    NO GROWTH 2 DAYS Performed at [facility], [address], [place], [place] [address]    Report Status PENDING  Incomplete  MRSA Next Gen by PCR, Nasal     Status: None   Collection Time: 10/06/24  3:30 PM   Specimen: Nasal Mucosa; Nasal Swab  Result Value Ref Range Status   MRSA by PCR Next Gen NOT DETECTED NOT DETECTED Final    Comment: (NOTE) The GeneXpert MRSA Assay (FDA approved for NASAL specimens only), is one component of a comprehensive MRSA colonization surveillance program. It is not  intended to diagnose MRSA infection nor to guide or monitor treatment for MRSA infections. Test performance is not FDA approved in patients less than 39 years old. Performed at [facility], [address] [place], [place] [address]   Respiratory (~20 pathogens) panel by PCR     Status: None   Collection Time: 10/06/24  3:46 PM   Specimen: Nasopharyngeal Swab; Respiratory  Result Value Ref Range Status   Adenovirus NOT DETECTED NOT DETECTED Final   Coronavirus 229E NOT DETECTED NOT DETECTED Final    Comment: (NOTE) The Coronavirus on the Respiratory Panel, DOES NOT test for the novel  Coronavirus (2019 nCoV)    Coronavirus HKU1 NOT DETECTED NOT DETECTED Final   Coronavirus NL63 NOT DETECTED NOT DETECTED Final   Coronavirus OC43 NOT DETECTED NOT DETECTED Final   Metapneumovirus NOT DETECTED NOT DETECTED Final   Rhinovirus / Enterovirus NOT DETECTED NOT DETECTED Final   Influenza A NOT DETECTED NOT DETECTED Final   Influenza B NOT DETECTED NOT DETECTED Final   Parainfluenza Virus 1 NOT DETECTED NOT DETECTED Final   Parainfluenza Virus 2 NOT DETECTED NOT DETECTED Final   Parainfluenza Virus 3 NOT DETECTED NOT DETECTED Final   Parainfluenza Virus 4 NOT DETECTED NOT DETECTED Final   Respiratory Syncytial Virus NOT DETECTED NOT DETECTED Final   Bordetella pertussis NOT DETECTED NOT DETECTED Final   Bordetella Parapertussis NOT DETECTED NOT DETECTED Final   Chlamydophila pneumoniae NOT DETECTED NOT DETECTED Final   Mycoplasma pneumoniae NOT DETECTED NOT DETECTED Final    Comment: Performed at [facility], [address], [place], [place] [address]         Radiology Studies: CT Angio Chest Pulmonary Embolism (PE) W or WO Contrast Result Date: 10/07/2024 CLINICAL DATA:  89 year old with hypoxia. EXAM: CT ANGIOGRAPHY CHEST WITH CONTRAST TECHNIQUE: Multidetector CT imaging of the chest was performed using the standard protocol during bolus administration of  intravenous contrast. Multiplanar CT image reconstructions and MIPs were obtained to evaluate the vascular anatomy. RADIATION DOSE REDUCTION: This exam was performed according to the departmental dose-optimization program which includes automated exposure control, adjustment of the mA and/or kV according to patient size and/or use of iterative reconstruction technique. CONTRAST:  75mL OMNIPAQUE  IOHEXOL  350 MG/ML SOLN COMPARISON:  Radiograph 10/05/2024, chest CTA 09/19/2024 FINDINGS: Cardiovascular: There are no filling defects within the pulmonary arteries to suggest pulmonary embolus. Atherosclerosis with diffusely irregular  atheromatous plaque throughout the thoracic aorta. Aortic tortuosity. No acute aortic findings. The heart is enlarged. There are coronary artery calcifications. No pericardial effusion. Mediastinum/Nodes: Stable heterogeneous thyroid gland with substernal extension of left thyroid/isthmus nodule. No follow-up imaging is recommended based on patient's age. Redemonstrated mediastinal and bilateral hilar adenopathy. Many of the nodes demonstrate slight interval increase in size from prior exam, for example anterior paratracheal node measuring 12 mm, series 4, image 40 previously measured 10 mm. Decompressed esophagus. Lungs/Pleura: Moderate right pleural effusion has increased from prior exam, partially loculated at the apex. Small left pleural effusion has also increased. Diffuse bilateral ground-glass and confluent pulmonary opacities with progression from prior exam. Mild retained mucus within the tracheobronchial tree. Breathing motion artifact obscures detailed assessment. Upper Abdomen: No acute findings.  Stable adrenal thickening. Musculoskeletal: Scoliosis and degenerative change in the spine. Chronic bilateral shoulder arthropathy. Remote right proximal humeral fracture. Review of the MIP images confirms the above findings. IMPRESSION: 1. No pulmonary embolus. 2. Diffuse bilateral  ground-glass and confluent pulmonary opacities with progression from prior exam. Differential consideration include infection, including atypical organisms, ARDS, or inflammatory etiologies. No element of pulmonary edema is also considered. 3. Moderate right and small left pleural effusions have increased from prior exam. 4. Mediastinal and bilateral hilar adenopathy, slightly increased from prior exam. This is nonspecific, but may be reactive. Aortic Atherosclerosis (ICD10-I70.0). Electronically Signed   By: Andrea Gasman M.D.   On: 10/07/2024 17:01   ECHOCARDIOGRAM LIMITED Result Date: 10/06/2024    ECHOCARDIOGRAM LIMITED REPORT   Patient Name:   CARRINE KROBOTH [facility] Date of Exam: 10/06/2024 Medical Rec #:  [id number]       Height:       60.0 in Accession #:    [id number]      Weight:       153.9 lb Date of Birth:  1928-08-28       BSA:          1.670 m Patient Age:    89 years        BP:           122/50 mmHg Patient Gender: F               HR:           64 bpm. Exam Location:  [facility] Procedure: Limited Echo, Color Doppler and Intracardiac Opacification Agent            (Both Spectral and Color Flow Doppler were utilized during            procedure). Indications:     CHF-Acute Diastolic I50.31  History:         Patient has prior history of Echocardiogram examinations, most                  recent 07/25/2024. CHF.  Sonographer:     Ashley McNeely-Sloane Referring Phys:  [id number] [facility] Yolande Skoda Diagnosing Phys: Keller Paterson IMPRESSIONS  1. Left ventricular ejection fraction, by estimation, is 60 to 65%. The left ventricle has normal function. The left ventricle has no regional wall motion abnormalities.  2. Right ventricular systolic function is normal. The right ventricular size is normal.  3. The mitral valve is normal in structure. Trivial mitral valve regurgitation.  4. The aortic valve is tricuspid. Aortic valve regurgitation is not visualized.  5. The inferior vena cava is normal in size with greater than  50% respiratory variability, suggesting right atrial pressure of 3 mmHg. FINDINGS  Left Ventricle: Left ventricular ejection fraction, by estimation, is 60 to 65%. The left ventricle has normal function. The left ventricle has no regional wall motion abnormalities. Definity contrast agent was given IV to delineate the left ventricular  endocardial borders. The left ventricular internal cavity size was normal in size. Right Ventricle: The right ventricular size is normal. No increase in right ventricular wall thickness. Right ventricular systolic function is normal. Pericardium: There is no evidence of pericardial effusion. Mitral Valve: The mitral valve is normal in structure. Trivial mitral valve regurgitation. Tricuspid Valve: The tricuspid valve is normal in structure. Tricuspid valve regurgitation is mild. Aortic Valve: The aortic valve is tricuspid. Aortic valve regurgitation is not visualized. Pulmonic Valve: The pulmonic valve was not well visualized. Pulmonic valve regurgitation is not visualized. Aorta: The aortic root is normal in size and structure. Venous: The inferior vena cava is normal in size with greater than 50% respiratory variability, suggesting right atrial pressure of 3 mmHg. Additional Comments: Color Doppler performed.   LV Volumes (MOD) LV vol d, MOD A2C: 46.8 ml LV vol d, MOD A4C: 36.5 ml LV vol s, MOD A2C: 14.8 ml LV vol s, MOD A4C: 14.1 ml LV SV MOD A2C:     32.0 ml LV SV MOD A4C:     36.5 ml LV SV MOD BP:      27.1 ml Keller Paterson Electronically signed by Keller Paterson Signature Date/Time: 10/06/2024/2:57:57 PM    Final         Scheduled Meds:  allopurinol   100 mg Oral Daily   donepezil   10 mg Oral QHS   enoxaparin (LOVENOX) injection  40 mg Subcutaneous Q24H   insulin aspart  0-15 Units Subcutaneous Q4H   Continuous Infusions:  meropenem (MERREM) IV 1 g (10/08/24 0929)     LOS: 3 days   CRITICAL CARE Performed by: Devaughn KATHEE Ban   Total critical care time: 56  minutes  Critical care time was exclusive of separately billable procedures and treating other patients.  Critical care was necessary to treat or prevent imminent or life-threatening deterioration.  Critical care was time spent personally by me on the following activities: development of treatment plan with patient and/or surrogate as well as nursing, discussions with consultants, evaluation of patient's response to treatment, examination of patient, obtaining history from patient or surrogate, ordering and performing treatments and interventions, ordering and review of laboratory studies, ordering and review of radiographic studies, pulse oximetry and re-evaluation of patient's condition.    Devaughn KATHEE Ban, MD [facility]   If 7PM-7AM, please contact night-coverage [email or web address] Password [email or web address] 10/08/2024, 10:19 AM

## 2024-10-08 NOTE — Plan of Care (Signed)
   Problem: Education: Goal: Ability to describe self-care measures that may prevent or decrease complications (Diabetes Survival Skills Education) will improve Outcome: Progressing Goal: Individualized Educational Video(s) Outcome: Progressing   Problem: Coping: Goal: Ability to adjust to condition or change in health will improve Outcome: Progressing   Problem: Fluid Volume: Goal: Ability to maintain a balanced intake and output will improve Outcome: Progressing   Problem: Health Behavior/Discharge Planning: Goal: Ability to identify and utilize available resources and services will improve Outcome: Progressing Goal: Ability to manage health-related needs will improve Outcome: Progressing   Problem: Metabolic: Goal: Ability to maintain appropriate glucose levels will improve Outcome: Progressing   Problem: Nutritional: Goal: Maintenance of adequate nutrition will improve Outcome: Progressing Goal: Progress toward achieving an optimal weight will improve Outcome: Progressing   Problem: Skin Integrity: Goal: Risk for impaired skin integrity will decrease Outcome: Progressing   Problem: Tissue Perfusion: Goal: Adequacy of tissue perfusion will improve Outcome: Progressing   Problem: Education: Goal: Knowledge of General Education information will improve Description: Including pain rating scale, medication(s)/side effects and non-pharmacologic comfort measures Outcome: Progressing   Problem: Health Behavior/Discharge Planning: Goal: Ability to manage health-related needs will improve Outcome: Progressing   Problem: Clinical Measurements: Goal: Ability to maintain clinical measurements within normal limits will improve Outcome: Progressing Goal: Will remain free from infection Outcome: Progressing Goal: Diagnostic test results will improve Outcome: Progressing Goal: Respiratory complications will improve Outcome: Progressing Goal: Cardiovascular complication will  be avoided Outcome: Progressing   Problem: Activity: Goal: Risk for activity intolerance will decrease Outcome: Progressing   Problem: Nutrition: Goal: Adequate nutrition will be maintained Outcome: Progressing   Problem: Coping: Goal: Level of anxiety will decrease Outcome: Progressing   Problem: Elimination: Goal: Will not experience complications related to bowel motility Outcome: Progressing Goal: Will not experience complications related to urinary retention Outcome: Progressing   Problem: Pain Managment: Goal: General experience of comfort will improve and/or be controlled Outcome: Progressing   Problem: Safety: Goal: Ability to remain free from injury will improve Outcome: Progressing   Problem: Skin Integrity: Goal: Risk for impaired skin integrity will decrease Outcome: Progressing   Problem: Education: Goal: Ability to demonstrate management of disease process will improve Outcome: Progressing Goal: Ability to verbalize understanding of medication therapies will improve Outcome: Progressing Goal: Individualized Educational Video(s) Outcome: Progressing   Problem: Activity: Goal: Capacity to carry out activities will improve Outcome: Progressing   Problem: Cardiac: Goal: Ability to achieve and maintain adequate cardiopulmonary perfusion will improve Outcome: Progressing   Problem: Activity: Goal: Ability to tolerate increased activity will improve Outcome: Progressing   Problem: Clinical Measurements: Goal: Ability to maintain a body temperature in the normal range will improve Outcome: Progressing   Problem: Respiratory: Goal: Ability to maintain adequate ventilation will improve Outcome: Progressing Goal: Ability to maintain a clear airway will improve Outcome: Progressing

## 2024-10-08 NOTE — TOC Initial Note (Signed)
 Transition of Care [facility]) - Initial/Assessment Note    Patient Details  Name: [name] Griffin MRN: [id number] Date of Birth: 10-29-1928  Transition of Care [facility]) CM/SW Contact:    Victory Jackquline RAMAN, RN Phone Number: 10/08/2024, 2:54 PM  Clinical Narrative:   Chart reviewed. RNCM, spoke with the patient's son and daughter-in-law at the bedside. I introduced myself, my role, and explained that discharge planning recommendations would be discussed. PT recommended Short Term Rehab. They declined SNF and said that they are taking their mom home. They currently have [facility] coming in to assist with their mom. They are waiting for his brother to get back on Monday to make the decision if they are going to transition her to hospice.   RNCM will continue to follow discharge for planning/care coordination and update as applicable.            Expected Discharge Plan: Home w Home Health Services Barriers to Discharge: Continued Medical Work up   Patient Goals and CMS Choice            Expected Discharge Plan and Services       Living arrangements for the past 2 months: Single Family Home                                      Prior Living Arrangements/Services Living arrangements for the past 2 months: Single Family Home Lives with:: Adult Children   Do you feel safe going back to the place where you live?: Yes      Need for Family Participation in Patient Care: Yes (Comment) Care giver support system in place?: Yes (comment) Current home services: DME, Homehealth aide Criminal Activity/Legal Involvement Pertinent to Current Situation/Hospitalization: No - Comment as needed  Activities of Daily Living   ADL Screening (condition at time of admission) Independently performs ADLs?: No Does the patient have a NEW difficulty with bathing/dressing/toileting/self-feeding that is expected to last >3 days?: No Does the patient have a NEW difficulty with getting in/out of bed,  walking, or climbing stairs that is expected to last >3 days?: No Does the patient have a NEW difficulty with communication that is expected to last >3 days?: No Is the patient deaf or have difficulty hearing?: Yes Does the patient have difficulty seeing, even when wearing glasses/contacts?: No Does the patient have difficulty concentrating, remembering, or making decisions?: Yes  Permission Sought/Granted                  Emotional Assessment Appearance:: Appears stated age, Well-Groomed Attitude/Demeanor/Rapport: Lethargic, Unresponsive Affect (typically observed): Unable to Assess Orientation: :  (Very Lethargic, Not responding) Alcohol / Substance Use: Not Applicable Psych Involvement: No (comment)  Admission diagnosis:  HCAP (healthcare-associated pneumonia) [J18.9] Multifocal pneumonia [J18.8] Acute on chronic congestive heart failure, unspecified heart failure type 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) 08/28/2024   History of recent fall 08/28/2024   Acute blood loss anemia 07/25/2024   Closed left hip fracture (HCC) 07/23/2024   Fall at home, initial encounter 07/23/2024   Type II diabetes mellitus with renal manifestations (HCC) 07/23/2024   Chronic kidney disease, stage 3a (HCC) 07/23/2024   Oxygen desaturation 07/23/2024   Obesity (BMI 30-39.9)  07/23/2024   Elevated CK 07/23/2024   Syncope 07/23/2024   Memory impairment 01/27/2024   Type 2 diabetes mellitus without complication, without long-term current use of insulin (HCC) 07/27/2023   Idiopathic gout 07/27/2023   Essential hypertension, benign 07/27/2023   Stage 2 chronic kidney disease 07/27/2023   Presbycusis of both ears 07/27/2023   Mixed hyperlipidemia 07/27/2023   Closed fracture of proximal end of right humerus 04/20/2017   PCP:  Fernand Fredy RAMAN, MD Pharmacy:   [facility] [phone] - [place], [place] - [address] [place] [address] Phone: [phone] Fax: [phone]     Social Drivers of Health (SDOH) Social History: SDOH Screenings   Food Insecurity: No Food Insecurity (10/06/2024)  Housing: Low Risk  (10/06/2024)  Transportation Needs: No Transportation Needs (10/06/2024)  Utilities: Not At Risk (10/06/2024)  Social Connections: Unknown (10/06/2024)  Tobacco Use: Low Risk  (10/06/2024)  Recent Concern: Tobacco Use - Medium Risk (08/30/2024)   Received from [facility]   SDOH Interventions:     Readmission Risk Interventions     No data to display

## 2024-10-09 DIAGNOSIS — J9622 Acute and chronic respiratory failure with hypercapnia: Secondary | ICD-10-CM | POA: Diagnosis not present

## 2024-10-09 DIAGNOSIS — J9621 Acute and chronic respiratory failure with hypoxia: Secondary | ICD-10-CM | POA: Diagnosis not present

## 2024-10-09 LAB — GLUCOSE, CAPILLARY
Glucose-Capillary: 116 mg/dL — ABNORMAL HIGH (ref 70–99)
Glucose-Capillary: 119 mg/dL — ABNORMAL HIGH (ref 70–99)
Glucose-Capillary: 121 mg/dL — ABNORMAL HIGH (ref 70–99)
Glucose-Capillary: 137 mg/dL — ABNORMAL HIGH (ref 70–99)
Glucose-Capillary: 137 mg/dL — ABNORMAL HIGH (ref 70–99)
Glucose-Capillary: 94 mg/dL (ref 70–99)

## 2024-10-09 LAB — BASIC METABOLIC PANEL WITH GFR
Anion gap: 13 (ref 5–15)
BUN: 19 mg/dL (ref 8–23)
CO2: 34 mmol/L — ABNORMAL HIGH (ref 22–32)
Calcium: 9.1 mg/dL (ref 8.9–10.3)
Chloride: 91 mmol/L — ABNORMAL LOW (ref 98–111)
Creatinine, Ser: 0.94 mg/dL (ref 0.44–1.00)
GFR, Estimated: 56 mL/min — ABNORMAL LOW (ref >60)
Glucose, Bld: 121 mg/dL — ABNORMAL HIGH (ref 70–99)
Potassium: 3.5 mmol/L (ref 3.5–5.1)
Sodium: 138 mmol/L (ref 135–145)

## 2024-10-09 MED ORDER — FUROSEMIDE 10 MG/ML IJ SOLN
40.0000 mg | Freq: Once | INTRAMUSCULAR | Status: AC
  Administered 2024-10-09: 40 mg via INTRAVENOUS
  Filled 2024-10-09: qty 4

## 2024-10-09 NOTE — Plan of Care (Signed)
   Problem: Education: Goal: Ability to describe self-care measures that may prevent or decrease complications (Diabetes Survival Skills Education) will improve Outcome: Progressing Goal: Individualized Educational Video(s) Outcome: Progressing   Problem: Coping: Goal: Ability to adjust to condition or change in health will improve Outcome: Progressing   Problem: Fluid Volume: Goal: Ability to maintain a balanced intake and output will improve Outcome: Progressing   Problem: Health Behavior/Discharge Planning: Goal: Ability to identify and utilize available resources and services will improve Outcome: Progressing Goal: Ability to manage health-related needs will improve Outcome: Progressing   Problem: Metabolic: Goal: Ability to maintain appropriate glucose levels will improve Outcome: Progressing   Problem: Nutritional: Goal: Maintenance of adequate nutrition will improve Outcome: Progressing Goal: Progress toward achieving an optimal weight will improve Outcome: Progressing   Problem: Skin Integrity: Goal: Risk for impaired skin integrity will decrease Outcome: Progressing   Problem: Tissue Perfusion: Goal: Adequacy of tissue perfusion will improve Outcome: Progressing   Problem: Education: Goal: Knowledge of General Education information will improve Description: Including pain rating scale, medication(s)/side effects and non-pharmacologic comfort measures Outcome: Progressing   Problem: Health Behavior/Discharge Planning: Goal: Ability to manage health-related needs will improve Outcome: Progressing   Problem: Clinical Measurements: Goal: Ability to maintain clinical measurements within normal limits will improve Outcome: Progressing Goal: Will remain free from infection Outcome: Progressing Goal: Diagnostic test results will improve Outcome: Progressing Goal: Respiratory complications will improve Outcome: Progressing Goal: Cardiovascular complication will  be avoided Outcome: Progressing   Problem: Activity: Goal: Risk for activity intolerance will decrease Outcome: Progressing   Problem: Nutrition: Goal: Adequate nutrition will be maintained Outcome: Progressing   Problem: Coping: Goal: Level of anxiety will decrease Outcome: Progressing   Problem: Elimination: Goal: Will not experience complications related to bowel motility Outcome: Progressing Goal: Will not experience complications related to urinary retention Outcome: Progressing   Problem: Pain Managment: Goal: General experience of comfort will improve and/or be controlled Outcome: Progressing   Problem: Safety: Goal: Ability to remain free from injury will improve Outcome: Progressing   Problem: Skin Integrity: Goal: Risk for impaired skin integrity will decrease Outcome: Progressing   Problem: Education: Goal: Ability to demonstrate management of disease process will improve Outcome: Progressing Goal: Ability to verbalize understanding of medication therapies will improve Outcome: Progressing Goal: Individualized Educational Video(s) Outcome: Progressing   Problem: Activity: Goal: Capacity to carry out activities will improve Outcome: Progressing   Problem: Cardiac: Goal: Ability to achieve and maintain adequate cardiopulmonary perfusion will improve Outcome: Progressing   Problem: Activity: Goal: Ability to tolerate increased activity will improve Outcome: Progressing   Problem: Clinical Measurements: Goal: Ability to maintain a body temperature in the normal range will improve Outcome: Progressing   Problem: Respiratory: Goal: Ability to maintain adequate ventilation will improve Outcome: Progressing Goal: Ability to maintain a clear airway will improve Outcome: Progressing

## 2024-10-09 NOTE — Progress Notes (Signed)
 PROGRESS NOTE    [name] Griffin  FMW:[id number] DOB: 09/13/1928 DOA: 10/05/2024 PCP: Fernand Fredy RAMAN, MD     Brief Narrative:   [name] Griffin is a 89 y.o. female with medical history significant for Chronic HFpEF (EF 60 to 65%, G1 DD, 07/25/2024), stage IIIa CKD, HTN, DM, recently hospitalized (10/7 to 09/22/2024) with acute hypoxic respiratory failure secondary to multifocal pneumonia and CHF exacerbation, discharged on home O2 at 3 L, being admitted with worsening multifocal pneumonia, possible sepsis as well as CHF exacerbation.  EMS was called by patient's son who noted her to be very short of breath when trying to get out of bed.  On their arrival, she was tachycardic and hypoxic with O2 sats in the 70s.  She arrived on NRB, transitioned to HFNC on arrival. On arrival, satting at 98% on NRB maintained following transition to HFNC at 15 L/min.  Tachypneic to 30, afebrile respirations 30 VBG on 15 L high flow with pH 7.41, pCO2 67 WBC 11,000, lactic acid 1.7 Troponin 76 (recently in the 30s), and BNP 412, up from 305 a couple weeks prior CMP for the most part unremarkable EKG showed NSR at 87 Chest x-ray showed findings consistent with worsening multifocal pneumonia compared to 09/19/2024 along with small bilateral pleural effusions. Patient started on Maxipime, vancomycin and azithromycin , given a dose of IV Lasix    Assessment & Plan:   Principal Problem:   Acute on chronic respiratory failure with hypoxia and hypercapnia (HCC) Active Problems:   Obesity (BMI 30-39.9)   HCAP (healthcare-associated pneumonia)   Acute on chronic heart failure with preserved ejection fraction (HFpEF) (HCC)   Elevated troponin   Essential hypertension, benign   Type II diabetes mellitus with renal manifestations (HCC)   Chronic kidney disease, stage 3a (HCC)   Dementia (HCC)  # Acute hypoxic hypercapnic respiratory failure CTA no PE but shows multifocal infiltrates, infection/inflammation/ards on  ddx, also with mild/mod effusions. On 15 liter [place] on arrival then weaned to 6 but overnight 10/25 worsening hypoxia now on high flow. Family meeting today, sons are considering transition to full comfort care tomorrow but want some time to consider, for now continue current scope of care no escalation but will consider bipap if needed. Declines thoracentesis - hfnc, bipap trial as needed  # HCAP CXR showing signs multifocal pna. No significant cough however, no fevers, procal normal, covid and rvp neg. Does have mild leukocytosis.  Recent IV abx and hospitalization. Mrsa swab neg - urine antigens - follow blood cultures - not making sputum for culture - continue meropenem  # Report of tachycardia at home To 150s. Here sinus rhythm, no tachy - no events thus far on tele  # HFpEF with exacerbation # Tropinemia Bnp elevated. Preserved EF with mod phtn on tte in August. Trop 76>146>146. no EKG changes, not complaining of pain but demented. Likely demand. TTE with preserved EF, normal filling pressures - continue lasix  40 iv bid as kidney function allows  # HTN Bp soft - hold atenolol , lisinopril   # Gout - cont home allopurinol   # Dementia No behavioral disturbance - home donepezil   # Debility # History femur fracture Fracture August this year. Recent rehab, hasn't yet regained mobility - PT advising SNF (if consistent w/ goals of care)  # Goals of care See above    DVT prophylaxis: lovenox Code Status: dnr/dni, confirmed with son at bedside 10/26 Family Communication: sons updated at bedside 10/27  Level of care: Progressive  Status is: Inpatient Remains inpatient appropriate because: severity of illness    Consultants:  none  Procedures: none  Antimicrobials:  See above    Subjective: Disoriented, no complaints, tachpneic  Objective: Vitals:   10/09/24 0500 10/09/24 0800 10/09/24 0808 10/09/24 1215  BP:   (!) 141/64 121/71  Pulse:   90 72  Resp:   18  (!) 29  Temp:   97.7 F (36.5 C) 97.9 F (36.6 C)  TempSrc:   Oral Oral  SpO2:  93% 94% 93%  Weight: 64.3 kg     Height:        Intake/Output Summary (Last 24 hours) at 10/09/2024 1602 Last data filed at 10/09/2024 0931 Gross per 24 hour  Intake 620 ml  Output 550 ml  Net 70 ml   Filed Weights   10/07/24 0500 10/08/24 0500 10/09/24 0500  Weight: 66.4 kg 64.2 kg 64.3 kg    Examination:  General exam: Appears calm, NAD Respiratory system: scattered rales and rhonchi, tachypneic Cardiovascular system: S1 & S2 heard, RRR. Soft systolic murmur Gastrointestinal system: Abdomen is nondistended, soft and nontender. Central nervous system: Alert not oriented Extremities: Symmetric 5 x 5 power. No edema Skin: No visible rashes, lesions or ulcers Psychiatry: confused     Data Reviewed: I have personally reviewed following labs and imaging studies  CBC: Recent Labs  Lab 10/05/24 2225 10/06/24 0813 10/07/24 0522  WBC 11.2* 9.8 11.4*  NEUTROABS 8.5*  --   --   HGB 12.6 11.6* 11.2*  HCT 42.5 39.5 36.9  MCV 96.4 96.1 93.4  PLT 241 220 211   Basic Metabolic Panel: Recent Labs  Lab 10/05/24 2225 10/06/24 0813 10/07/24 0522 10/08/24 0527 10/09/24 0449  NA 141 143 139 138 138  K 3.4* 3.3* 3.2* 3.8 3.5  CL 98 98 93* 95* 91*  CO2 30 33* 34* 34* 34*  GLUCOSE 129* 95 90 94 121*  BUN 20 17 13 15 19   CREATININE 0.91 0.81 0.73 0.77 0.94  CALCIUM 8.8* 8.5* 8.5* 8.6* 9.1   GFR: Estimated Creatinine Clearance: 30.6 mL/min (by C-G formula based on SCr of 0.94 mg/dL). Liver Function Tests: Recent Labs  Lab 10/05/24 2225  AST 16  ALT 8  ALKPHOS 100  BILITOT 0.4  PROT 7.1  ALBUMIN 2.7*   No results for input(s): LIPASE, AMYLASE in the last 168 hours. No results for input(s): AMMONIA in the last 168 hours. Coagulation Profile: No results for input(s): INR, PROTIME in the last 168 hours. Cardiac Enzymes: No results for input(s): CKTOTAL, CKMB,  CKMBINDEX, TROPONINI in the last 168 hours. BNP (last 3 results) No results for input(s): PROBNP in the last 8760 hours. HbA1C: No results for input(s): HGBA1C in the last 72 hours. CBG: Recent Labs  Lab 10/08/24 1934 10/09/24 0018 10/09/24 0358 10/09/24 0808 10/09/24 1328  GLUCAP 189* 137* 137* 116* 119*   Lipid Profile: No results for input(s): CHOL, HDL, LDLCALC, TRIG, CHOLHDL, LDLDIRECT in the last 72 hours. Thyroid Function Tests: No results for input(s): TSH, T4TOTAL, FREET4, T3FREE, THYROIDAB in the last 72 hours. Anemia Panel: No results for input(s): VITAMINB12, FOLATE, FERRITIN, TIBC, IRON, RETICCTPCT in the last 72 hours. Urine analysis: No results found for: COLORURINE, APPEARANCEUR, LABSPEC, PHURINE, GLUCOSEU, HGBUR, BILIRUBINUR, KETONESUR, PROTEINUR, UROBILINOGEN, NITRITE, LEUKOCYTESUR Sepsis Labs: @LABRCNTIP (procalcitonin:4,lacticidven:4)  ) Recent Results (from the past 240 hours)  Blood Culture (routine x 2)     Status: None (Preliminary result)   Collection Time: 10/05/24 10:25 PM   Specimen:  BLOOD  Result Value Ref Range Status   Specimen Description BLOOD BLOOD RIGHT WRIST  Final   Special Requests   Final    BOTTLES DRAWN AEROBIC AND ANAEROBIC Blood Culture results may not be optimal due to an inadequate volume of blood received in culture bottles   Culture   Final    NO GROWTH 4 DAYS Performed at [facility], [address] [place], [place] [address]    Report Status PENDING  Incomplete  Resp panel by RT-PCR (RSV, Flu A&B, Covid) Anterior Nasal Swab     Status: None   Collection Time: 10/05/24 11:34 PM   Specimen: Anterior Nasal Swab  Result Value Ref Range Status   SARS Coronavirus 2 by RT PCR NEGATIVE NEGATIVE Final    Comment: (NOTE) SARS-CoV-2 target nucleic acids are NOT DETECTED.  The SARS-CoV-2 RNA is generally detectable in upper respiratory specimens during the  acute phase of infection. The lowest concentration of SARS-CoV-2 viral copies this assay can detect is 138 copies/mL. A negative result does not preclude SARS-Cov-2 infection and should not be used as the sole basis for treatment or other patient management decisions. A negative result may occur with  improper specimen collection/handling, submission of specimen other than nasopharyngeal swab, presence of viral mutation(s) within the areas targeted by this assay, and inadequate number of viral copies(<138 copies/mL). A negative result must be combined with clinical observations, patient history, and epidemiological information. The expected result is Negative.  Fact Sheet for Patients:  bloggercourse.com  Fact Sheet for Healthcare Providers:  seriousbroker.it  This test is no t yet approved or cleared by the [place]  FDA and  has been authorized for detection and/or diagnosis of SARS-CoV-2 by FDA under an Emergency Use Authorization (EUA). This EUA will remain  in effect (meaning this test can be used) for the duration of the COVID-19 declaration under Section 564(b)(1) of the Act, 21 U.S.C.section 360bbb-3(b)(1), unless the authorization is terminated  or revoked sooner.       Influenza A by PCR NEGATIVE NEGATIVE Final   Influenza B by PCR NEGATIVE NEGATIVE Final    Comment: (NOTE) The Xpert Xpress SARS-CoV-2/FLU/RSV plus assay is intended as an aid in the diagnosis of influenza from Nasopharyngeal swab specimens and should not be used as a sole basis for treatment. Nasal washings and aspirates are unacceptable for Xpert Xpress SARS-CoV-2/FLU/RSV testing.  Fact Sheet for Patients: bloggercourse.com  Fact Sheet for Healthcare Providers: seriousbroker.it  This test is not yet approved or cleared by the [place]  FDA and has been authorized for detection and/or  diagnosis of SARS-CoV-2 by FDA under an Emergency Use Authorization (EUA). This EUA will remain in effect (meaning this test can be used) for the duration of the COVID-19 declaration under Section 564(b)(1) of the Act, 21 U.S.C. section 360bbb-3(b)(1), unless the authorization is terminated or revoked.     Resp Syncytial Virus by PCR NEGATIVE NEGATIVE Final    Comment: (NOTE) Fact Sheet for Patients: bloggercourse.com  Fact Sheet for Healthcare Providers: seriousbroker.it  This test is not yet approved or cleared by the [place]  FDA and has been authorized for detection and/or diagnosis of SARS-CoV-2 by FDA under an Emergency Use Authorization (EUA). This EUA will remain in effect (meaning this test can be used) for the duration of the COVID-19 declaration under Section 564(b)(1) of the Act, 21 U.S.C. section 360bbb-3(b)(1), unless the authorization is terminated or revoked.  Performed at [facility], [address] [place], [place]  [address]   Blood Culture (routine x 2)     Status: None (Preliminary result)   Collection Time: 10/06/24  1:33 AM   Specimen: BLOOD  Result Value Ref Range Status   Specimen Description BLOOD BLOOD RIGHT ARM  Final   Special Requests   Final    BOTTLES DRAWN AEROBIC AND ANAEROBIC Blood Culture results may not be optimal due to an inadequate volume of blood received in culture bottles   Culture   Final    NO GROWTH 3 DAYS Performed at [facility], [address] [place], [place] [address]    Report Status PENDING  Incomplete  MRSA Next Gen by PCR, Nasal     Status: None   Collection Time: 10/06/24  3:30 PM   Specimen: Nasal Mucosa; Nasal Swab  Result Value Ref Range Status   MRSA by PCR Next Gen NOT DETECTED NOT DETECTED Final    Comment: (NOTE) The GeneXpert MRSA Assay (FDA approved for NASAL specimens only), is one component of a comprehensive MRSA colonization  surveillance program. It is not intended to diagnose MRSA infection nor to guide or monitor treatment for MRSA infections. Test performance is not FDA approved in patients less than 59 years old. Performed at [facility], [address] [place], [place] [address]   Respiratory (~20 pathogens) panel by PCR     Status: None   Collection Time: 10/06/24  3:46 PM   Specimen: Nasopharyngeal Swab; Respiratory  Result Value Ref Range Status   Adenovirus NOT DETECTED NOT DETECTED Final   Coronavirus 229E NOT DETECTED NOT DETECTED Final    Comment: (NOTE) The Coronavirus on the Respiratory Panel, DOES NOT test for the novel  Coronavirus (2019 nCoV)    Coronavirus HKU1 NOT DETECTED NOT DETECTED Final   Coronavirus NL63 NOT DETECTED NOT DETECTED Final   Coronavirus OC43 NOT DETECTED NOT DETECTED Final   Metapneumovirus NOT DETECTED NOT DETECTED Final   Rhinovirus / Enterovirus NOT DETECTED NOT DETECTED Final   Influenza A NOT DETECTED NOT DETECTED Final   Influenza B NOT DETECTED NOT DETECTED Final   Parainfluenza Virus 1 NOT DETECTED NOT DETECTED Final   Parainfluenza Virus 2 NOT DETECTED NOT DETECTED Final   Parainfluenza Virus 3 NOT DETECTED NOT DETECTED Final   Parainfluenza Virus 4 NOT DETECTED NOT DETECTED Final   Respiratory Syncytial Virus NOT DETECTED NOT DETECTED Final   Bordetella pertussis NOT DETECTED NOT DETECTED Final   Bordetella Parapertussis NOT DETECTED NOT DETECTED Final   Chlamydophila pneumoniae NOT DETECTED NOT DETECTED Final   Mycoplasma pneumoniae NOT DETECTED NOT DETECTED Final    Comment: Performed at [facility], [address] [place], [place] [address]         Radiology Studies: CT Angio Chest Pulmonary Embolism (PE) W or WO Contrast Result Date: 10/07/2024 CLINICAL DATA:  89 year old with hypoxia. EXAM: CT ANGIOGRAPHY CHEST WITH CONTRAST TECHNIQUE: Multidetector CT imaging of the chest was performed using the standard protocol during  bolus administration of intravenous contrast. Multiplanar CT image reconstructions and MIPs were obtained to evaluate the vascular anatomy. RADIATION DOSE REDUCTION: This exam was performed according to the departmental dose-optimization program which includes automated exposure control, adjustment of the mA and/or kV according to patient size and/or use of iterative reconstruction technique. CONTRAST:  75mL OMNIPAQUE  IOHEXOL  350 MG/ML SOLN COMPARISON:  Radiograph 10/05/2024, chest CTA 09/19/2024 FINDINGS: Cardiovascular: There are no filling defects within the pulmonary arteries to suggest pulmonary embolus. Atherosclerosis with diffusely irregular atheromatous  plaque throughout the thoracic aorta. Aortic tortuosity. No acute aortic findings. The heart is enlarged. There are coronary artery calcifications. No pericardial effusion. Mediastinum/Nodes: Stable heterogeneous thyroid gland with substernal extension of left thyroid/isthmus nodule. No follow-up imaging is recommended based on patient's age. Redemonstrated mediastinal and bilateral hilar adenopathy. Many of the nodes demonstrate slight interval increase in size from prior exam, for example anterior paratracheal node measuring 12 mm, series 4, image 40 previously measured 10 mm. Decompressed esophagus. Lungs/Pleura: Moderate right pleural effusion has increased from prior exam, partially loculated at the apex. Small left pleural effusion has also increased. Diffuse bilateral ground-glass and confluent pulmonary opacities with progression from prior exam. Mild retained mucus within the tracheobronchial tree. Breathing motion artifact obscures detailed assessment. Upper Abdomen: No acute findings.  Stable adrenal thickening. Musculoskeletal: Scoliosis and degenerative change in the spine. Chronic bilateral shoulder arthropathy. Remote right proximal humeral fracture. Review of the MIP images confirms the above findings. IMPRESSION: 1. No pulmonary embolus. 2.  Diffuse bilateral ground-glass and confluent pulmonary opacities with progression from prior exam. Differential consideration include infection, including atypical organisms, ARDS, or inflammatory etiologies. No element of pulmonary edema is also considered. 3. Moderate right and small left pleural effusions have increased from prior exam. 4. Mediastinal and bilateral hilar adenopathy, slightly increased from prior exam. This is nonspecific, but may be reactive. Aortic Atherosclerosis (ICD10-I70.0). Electronically Signed   By: Andrea Gasman M.D.   On: 10/07/2024 17:01        Scheduled Meds:  allopurinol   100 mg Oral Daily   donepezil   10 mg Oral QHS   enoxaparin (LOVENOX) injection  40 mg Subcutaneous Q24H   insulin aspart  0-15 Units Subcutaneous Q4H   Continuous Infusions:  meropenem (MERREM) IV 1 g (10/09/24 0946)     LOS: 4 days     Devaughn KATHEE Ban, MD [facility]   If 7PM-7AM, please contact night-coverage [email or web address] Password [email or web address] 10/09/2024, 4:02 PM

## 2024-10-09 NOTE — Care Management Important Message (Signed)
 Important Message  Patient Details  Name: [name] Griffin MRN: [id number] Date of Birth: February 13, 1928   Important Message Given:  Yes - Medicare IM     Rojelio [name] 10/09/2024, 4:48 PM

## 2024-10-09 NOTE — Progress Notes (Signed)
 Heart Failure Navigator Progress Note  Assessed for Heart & Vascular TOC clinic readiness.  Family will be collaborating with other family members today. They will be arriving to talk about plans for patient possibly going home with [facility] or Hospice. They chose to not schedule a CHF TOC at this time.    Navigator will sign off at this time.  Charmaine Pines, RN, BSN [facility] Heart Failure Navigator Secure Chat Only

## 2024-10-10 ENCOUNTER — Telehealth

## 2024-10-10 DIAGNOSIS — J9622 Acute and chronic respiratory failure with hypercapnia: Secondary | ICD-10-CM | POA: Diagnosis not present

## 2024-10-10 DIAGNOSIS — J9621 Acute and chronic respiratory failure with hypoxia: Secondary | ICD-10-CM | POA: Diagnosis not present

## 2024-10-10 LAB — BASIC METABOLIC PANEL WITH GFR
Anion gap: 10 (ref 5–15)
BUN: 24 mg/dL — ABNORMAL HIGH (ref 8–23)
CO2: 38 mmol/L — ABNORMAL HIGH (ref 22–32)
Calcium: 8.9 mg/dL (ref 8.9–10.3)
Chloride: 91 mmol/L — ABNORMAL LOW (ref 98–111)
Creatinine, Ser: 0.85 mg/dL (ref 0.44–1.00)
GFR, Estimated: >60 mL/min (ref >60)
Glucose, Bld: 91 mg/dL (ref 70–99)
Potassium: 3.2 mmol/L — ABNORMAL LOW (ref 3.5–5.1)
Sodium: 139 mmol/L (ref 135–145)

## 2024-10-10 LAB — GLUCOSE, CAPILLARY
Glucose-Capillary: 107 mg/dL — ABNORMAL HIGH (ref 70–99)
Glucose-Capillary: 91 mg/dL (ref 70–99)
Glucose-Capillary: 94 mg/dL (ref 70–99)

## 2024-10-10 LAB — CULTURE, BLOOD (ROUTINE X 2): Culture: NO GROWTH

## 2024-10-10 MED ORDER — DIPHENHYDRAMINE HCL 50 MG/ML IJ SOLN: 25.0000 mg | INTRAMUSCULAR | Status: DC | PRN

## 2024-10-10 MED ORDER — MORPHINE BOLUS VIA INFUSION
5.0000 mg | INTRAVENOUS | Status: DC | PRN
  Administered 2024-10-10 (×2): 5 mg via INTRAVENOUS

## 2024-10-10 MED ORDER — ONDANSETRON HCL 4 MG/2ML IJ SOLN: 4.0000 mg | Freq: Four times a day (QID) | INTRAMUSCULAR | Status: DC | PRN

## 2024-10-10 MED ORDER — GLYCOPYRROLATE 0.2 MG/ML IJ SOLN: 0.2000 mg | INTRAMUSCULAR | Status: DC | PRN

## 2024-10-10 MED ORDER — GLYCOPYRROLATE 0.2 MG/ML IJ SOLN
0.2000 mg | INTRAMUSCULAR | Status: DC | PRN
Start: 2024-10-10 — End: 2024-10-10

## 2024-10-10 MED ORDER — MORPHINE 100MG IN NS 100ML (1MG/ML) PREMIX INFUSION
0.0000 mg/h | INTRAVENOUS | Status: DC
  Administered 2024-10-10: 5 mg/h via INTRAVENOUS
  Administered 2024-10-10: 10 mg/h via INTRAVENOUS
  Filled 2024-10-10: qty 100

## 2024-10-10 MED ORDER — ACETAMINOPHEN 650 MG RE SUPP: 650.0000 mg | Freq: Four times a day (QID) | RECTAL | Status: DC | PRN

## 2024-10-10 MED ORDER — POLYVINYL ALCOHOL 1.4 % OP SOLN: 1.0000 [drp] | Freq: Four times a day (QID) | OPHTHALMIC | Status: DC | PRN

## 2024-10-10 MED ORDER — ONDANSETRON 4 MG PO TBDP: 4.0000 mg | ORAL_TABLET | Freq: Four times a day (QID) | ORAL | Status: DC | PRN

## 2024-10-10 MED ORDER — ACETAMINOPHEN 325 MG PO TABS: 650.0000 mg | ORAL_TABLET | Freq: Four times a day (QID) | ORAL | Status: DC | PRN

## 2024-10-10 MED ORDER — MIDAZOLAM HCL (PF) 2 MG/2ML IJ SOLN
2.0000 mg | INTRAMUSCULAR | Status: DC | PRN
  Administered 2024-10-10: 2 mg via INTRAVENOUS
  Filled 2024-10-10: qty 2

## 2024-10-10 MED ORDER — SODIUM CHLORIDE 0.9 % IV SOLN: INTRAVENOUS | Status: DC

## 2024-10-10 MED ORDER — GLYCOPYRROLATE 1 MG PO TABS: 1.0000 mg | ORAL_TABLET | ORAL | Status: DC | PRN

## 2024-10-11 LAB — CULTURE, BLOOD (ROUTINE X 2): Culture: NO GROWTH

## 2024-10-13 LAB — BLOOD GAS, VENOUS
Acid-Base Excess: 16 mmol/L — ABNORMAL HIGH (ref 0.0–2.0)
Bicarbonate: 43.5 mmol/L — ABNORMAL HIGH (ref 20.0–28.0)
O2 Saturation: 31.6 %
Patient temperature: 37
pCO2, Ven: 64 mmHg — ABNORMAL HIGH (ref 44–60)
pH, Ven: 7.44 — ABNORMAL HIGH (ref 7.25–7.43)

## 2024-10-14 NOTE — Progress Notes (Signed)
 PROGRESS NOTE    [name] Griffin  FMW:[id number] DOB: 04/11/1928 DOA: 10/05/2024 PCP: Fernand Fredy RAMAN, MD     Brief Narrative:   [name] Griffin is a 89 y.o. female with medical history significant for Chronic HFpEF (EF 60 to 65%, G1 DD, 07/25/2024), stage IIIa CKD, HTN, DM, recently hospitalized (10/7 to 09/22/2024) with acute hypoxic respiratory failure secondary to multifocal pneumonia and CHF exacerbation, discharged on home O2 at 3 L, being admitted with worsening multifocal pneumonia, possible sepsis as well as CHF exacerbation.  EMS was called by patient's son who noted her to be very short of breath when trying to get out of bed.  On their arrival, she was tachycardic and hypoxic with O2 sats in the 70s.  She arrived on NRB, transitioned to HFNC on arrival. On arrival, satting at 98% on NRB maintained following transition to HFNC at 15 L/min.  Tachypneic to 30, afebrile respirations 30 VBG on 15 L high flow with pH 7.41, pCO2 67 WBC 11,000, lactic acid 1.7 Troponin 76 (recently in the 30s), and BNP 412, up from 305 a couple weeks prior CMP for the most part unremarkable EKG showed NSR at 87 Chest x-ray showed findings consistent with worsening multifocal pneumonia compared to 09/19/2024 along with small bilateral pleural effusions. Patient started on Maxipime, vancomycin and azithromycin , given a dose of IV Lasix    Assessment & Plan:   Principal Problem:   Acute on chronic respiratory failure with hypoxia and hypercapnia (HCC) Active Problems:   Obesity (BMI 30-39.9)   HCAP (healthcare-associated pneumonia)   Acute on chronic heart failure with preserved ejection fraction (HFpEF) (HCC)   Elevated troponin   Essential hypertension, benign   Type II diabetes mellitus with renal manifestations (HCC)   Chronic kidney disease, stage 3a (HCC)   Dementia (HCC)  # Acute hypoxic hypercapnic respiratory failure CTA no PE but shows multifocal infiltrates, infection/inflammation/ards on  ddx, also with mild/mod effusions. On 15 liter Anthon on arrival then weaned to 6 but overnight 10/25 worsening hypoxia now on high flow. Wth hypercarbia today, encephalopathy. Unable to wean from high flow last two days. Transitioning to full comfort care today, starting morphine  gtt  # HCAP CXR showing signs multifocal pna. No significant cough however, no fevers, procal normal, covid and rvp neg. Does have mild leukocytosis.  Recent IV abx and hospitalization. Mrsa swab neg. Treated with 5 days meropenem here - comfort care now  # HFpEF with exacerbation # Tropinemia Bnp elevated. Preserved EF with mod phtn on tte in August. Trop 76>146>146. no EKG changes, not complaining of pain but demented. Likely demand. TTE with preserved EF, normal filling pressures. IV diuresed here.  # HTN Bp soft, held home atenolol , lisinopril   # Gout No flare  # Dementia  # Debility # History femur fracture Fracture August this year. Recent rehab, hasn't yet regained mobility  # Goals of care See above, transitioned to full comfort care today after discussion with son    DVT prophylaxis: lovenox Code Status: dnr/dni, confirmed with son at bedside 10/26 Family Communication: sons updated at bedside 2024-11-02  Level of care: Progressive Status is: Inpatient Remains inpatient appropriate because: unsafe d/c plan, anticipate in-hospital death    Consultants:  none  Procedures: none  Antimicrobials:  See above    Subjective: Agitated this morning though not presently  Objective: Vitals:   11/02/2024 0410 2024/11/02 0450 2024-11-02 0749 2024-11-02 0825  BP: (!) 147/67  137/64   Pulse: 81  98   Resp: (!) 24  20   Temp: 97.6 F (36.4 C)  98.1 F (36.7 C)   TempSrc: Oral  Oral   SpO2: 94%  93% 92%  Weight:  63.9 kg    Height:        Intake/Output Summary (Last 24 hours) at 11/05/24 0927 Last data filed at 11-05-2024 0802 Gross per 24 hour  Intake 270 ml  Output 1100 ml  Net -830 ml    Filed Weights   10/08/24 0500 10/09/24 0500 11-05-2024 0450  Weight: 64.2 kg 64.3 kg 63.9 kg    Examination:  General exam: Appears calm Respiratory system: scattered rales and rhonchi, tachypneic Cardiovascular system: S1 & S2 heard, tachycardic Gastrointestinal system: Abdomen is nondistended, soft and nontender. Central nervous system: Alert not oriented Extremities: Symmetric 5 x 5 power. No edema Skin: No visible rashes, lesions or ulcers Psychiatry: confused     Data Reviewed: I have personally reviewed following labs and imaging studies  CBC: Recent Labs  Lab 10/05/24 2225 10/06/24 0813 10/07/24 0522  WBC 11.2* 9.8 11.4*  NEUTROABS 8.5*  --   --   HGB 12.6 11.6* 11.2*  HCT 42.5 39.5 36.9  MCV 96.4 96.1 93.4  PLT 241 220 211   Basic Metabolic Panel: Recent Labs  Lab 10/06/24 0813 10/07/24 0522 10/08/24 0527 10/09/24 0449 11-05-2024 0523  NA 143 139 138 138 139  K 3.3* 3.2* 3.8 3.5 3.2*  CL 98 93* 95* 91* 91*  CO2 33* 34* 34* 34* 38*  GLUCOSE 95 90 94 121* 91  BUN 17 13 15 19  24*  CREATININE 0.81 0.73 0.77 0.94 0.85  CALCIUM 8.5* 8.5* 8.6* 9.1 8.9   GFR: Estimated Creatinine Clearance: 33.8 mL/min (by C-G formula based on SCr of 0.85 mg/dL). Liver Function Tests: Recent Labs  Lab 10/05/24 2225  AST 16  ALT 8  ALKPHOS 100  BILITOT 0.4  PROT 7.1  ALBUMIN 2.7*   No results for input(s): LIPASE, AMYLASE in the last 168 hours. No results for input(s): AMMONIA in the last 168 hours. Coagulation Profile: No results for input(s): INR, PROTIME in the last 168 hours. Cardiac Enzymes: No results for input(s): CKTOTAL, CKMB, CKMBINDEX, TROPONINI in the last 168 hours. BNP (last 3 results) No results for input(s): PROBNP in the last 8760 hours. HbA1C: No results for input(s): HGBA1C in the last 72 hours. CBG: Recent Labs  Lab 10/09/24 1629 10/09/24 1950 11-05-24 0018 2024/11/05 0411 2024/11/05 0752  GLUCAP 121* 94 107* 94 91    Lipid Profile: No results for input(s): CHOL, HDL, LDLCALC, TRIG, CHOLHDL, LDLDIRECT in the last 72 hours. Thyroid Function Tests: No results for input(s): TSH, T4TOTAL, FREET4, T3FREE, THYROIDAB in the last 72 hours. Anemia Panel: No results for input(s): VITAMINB12, FOLATE, FERRITIN, TIBC, IRON, RETICCTPCT in the last 72 hours. Urine analysis: No results found for: COLORURINE, APPEARANCEUR, LABSPEC, PHURINE, GLUCOSEU, HGBUR, BILIRUBINUR, KETONESUR, PROTEINUR, UROBILINOGEN, NITRITE, LEUKOCYTESUR Sepsis Labs: @LABRCNTIP (procalcitonin:4,lacticidven:4)  ) Recent Results (from the past 240 hours)  Blood Culture (routine x 2)     Status: None   Collection Time: 10/05/24 10:25 PM   Specimen: BLOOD  Result Value Ref Range Status   Specimen Description BLOOD BLOOD RIGHT WRIST  Final   Special Requests   Final    BOTTLES DRAWN AEROBIC AND ANAEROBIC Blood Culture results may not be optimal due to an inadequate volume of blood received in culture bottles   Culture   Final    NO GROWTH 5  DAYS Performed at [facility], [address] [place], [place] [address]    Report Status 10/13/2024 FINAL  Final  Resp panel by RT-PCR (RSV, Flu A&B, Covid) Anterior Nasal Swab     Status: None   Collection Time: 10/05/24 11:34 PM   Specimen: Anterior Nasal Swab  Result Value Ref Range Status   SARS Coronavirus 2 by RT PCR NEGATIVE NEGATIVE Final    Comment: (NOTE) SARS-CoV-2 target nucleic acids are NOT DETECTED.  The SARS-CoV-2 RNA is generally detectable in upper respiratory specimens during the acute phase of infection. The lowest concentration of SARS-CoV-2 viral copies this assay can detect is 138 copies/mL. A negative result does not preclude SARS-Cov-2 infection and should not be used as the sole basis for treatment or other patient management decisions. A negative result may occur with  improper specimen  collection/handling, submission of specimen other than nasopharyngeal swab, presence of viral mutation(s) within the areas targeted by this assay, and inadequate number of viral copies(<138 copies/mL). A negative result must be combined with clinical observations, patient history, and epidemiological information. The expected result is Negative.  Fact Sheet for Patients:  bloggercourse.com  Fact Sheet for Healthcare Providers:  seriousbroker.it  This test is no t yet approved or cleared by the [place]  FDA and  has been authorized for detection and/or diagnosis of SARS-CoV-2 by FDA under an Emergency Use Authorization (EUA). This EUA will remain  in effect (meaning this test can be used) for the duration of the COVID-19 declaration under Section 564(b)(1) of the Act, 21 U.S.C.section 360bbb-3(b)(1), unless the authorization is terminated  or revoked sooner.       Influenza A by PCR NEGATIVE NEGATIVE Final   Influenza B by PCR NEGATIVE NEGATIVE Final    Comment: (NOTE) The Xpert Xpress SARS-CoV-2/FLU/RSV plus assay is intended as an aid in the diagnosis of influenza from Nasopharyngeal swab specimens and should not be used as a sole basis for treatment. Nasal washings and aspirates are unacceptable for Xpert Xpress SARS-CoV-2/FLU/RSV testing.  Fact Sheet for Patients: bloggercourse.com  Fact Sheet for Healthcare Providers: seriousbroker.it  This test is not yet approved or cleared by the [place]  FDA and has been authorized for detection and/or diagnosis of SARS-CoV-2 by FDA under an Emergency Use Authorization (EUA). This EUA will remain in effect (meaning this test can be used) for the duration of the COVID-19 declaration under Section 564(b)(1) of the Act, 21 U.S.C. section 360bbb-3(b)(1), unless the authorization is terminated or revoked.     Resp Syncytial  Virus by PCR NEGATIVE NEGATIVE Final    Comment: (NOTE) Fact Sheet for Patients: bloggercourse.com  Fact Sheet for Healthcare Providers: seriousbroker.it  This test is not yet approved or cleared by the [place]  FDA and has been authorized for detection and/or diagnosis of SARS-CoV-2 by FDA under an Emergency Use Authorization (EUA). This EUA will remain in effect (meaning this test can be used) for the duration of the COVID-19 declaration under Section 564(b)(1) of the Act, 21 U.S.C. section 360bbb-3(b)(1), unless the authorization is terminated or revoked.  Performed at [facility], [address] [place], [place] [address]   Blood Culture (routine x 2)     Status: None (Preliminary result)   Collection Time: 10/06/24  1:33 AM   Specimen: BLOOD  Result Value Ref Range Status   Specimen Description BLOOD BLOOD RIGHT ARM  Final   Special Requests   Final    BOTTLES DRAWN AEROBIC AND ANAEROBIC Blood Culture  results may not be optimal due to an inadequate volume of blood received in culture bottles   Culture   Final    NO GROWTH 4 DAYS Performed at [facility], [address] [place], [place] [address]    Report Status PENDING  Incomplete  MRSA Next Gen by PCR, Nasal     Status: None   Collection Time: 10/06/24  3:30 PM   Specimen: Nasal Mucosa; Nasal Swab  Result Value Ref Range Status   MRSA by PCR Next Gen NOT DETECTED NOT DETECTED Final    Comment: (NOTE) The GeneXpert MRSA Assay (FDA approved for NASAL specimens only), is one component of a comprehensive MRSA colonization surveillance program. It is not intended to diagnose MRSA infection nor to guide or monitor treatment for MRSA infections. Test performance is not FDA approved in patients less than 16 years old. Performed at [facility], [address] [place], [place] [address]   Respiratory (~20 pathogens) panel by PCR      Status: None   Collection Time: 10/06/24  3:46 PM   Specimen: Nasopharyngeal Swab; Respiratory  Result Value Ref Range Status   Adenovirus NOT DETECTED NOT DETECTED Final   Coronavirus 229E NOT DETECTED NOT DETECTED Final    Comment: (NOTE) The Coronavirus on the Respiratory Panel, DOES NOT test for the novel  Coronavirus (2019 nCoV)    Coronavirus HKU1 NOT DETECTED NOT DETECTED Final   Coronavirus NL63 NOT DETECTED NOT DETECTED Final   Coronavirus OC43 NOT DETECTED NOT DETECTED Final   Metapneumovirus NOT DETECTED NOT DETECTED Final   Rhinovirus / Enterovirus NOT DETECTED NOT DETECTED Final   Influenza A NOT DETECTED NOT DETECTED Final   Influenza B NOT DETECTED NOT DETECTED Final   Parainfluenza Virus 1 NOT DETECTED NOT DETECTED Final   Parainfluenza Virus 2 NOT DETECTED NOT DETECTED Final   Parainfluenza Virus 3 NOT DETECTED NOT DETECTED Final   Parainfluenza Virus 4 NOT DETECTED NOT DETECTED Final   Respiratory Syncytial Virus NOT DETECTED NOT DETECTED Final   Bordetella pertussis NOT DETECTED NOT DETECTED Final   Bordetella Parapertussis NOT DETECTED NOT DETECTED Final   Chlamydophila pneumoniae NOT DETECTED NOT DETECTED Final   Mycoplasma pneumoniae NOT DETECTED NOT DETECTED Final    Comment: Performed at [facility], [address] [place], [place] [address]         Radiology Studies: No results found.       Scheduled Meds:  allopurinol   100 mg Oral Daily   Continuous Infusions:  sodium chloride      morphine        LOS: 5 days     Devaughn KATHEE Ban, MD [facility]   If 7PM-7AM, please contact night-coverage [email or web address] Password [facility] October 25, 2024, 9:27 AM

## 2024-10-14 NOTE — TOC Progression Note (Signed)
 Transition of Care [facility]) - Progression Note    Patient Details  Name: Darshana Curnutt MRN: [id number] Date of Birth: 1928/10/04  [facility] CM/SW Contact  Lauraine JAYSON Carpen, LCSW Phone Number: 11-03-24, 10:01 AM  Clinical Narrative: Per MD, anticipated hospital death but he would like TOC to discuss potential home hospice if needed. CSW brought list to room. Daughter-in-law declined to review list at this time as she feels like patient will pass soon.    Expected Discharge Plan: Home w Home Health Services Barriers to Discharge: Continued Medical Work up               Expected Discharge Plan and Services       Living arrangements for the past 2 months: Single Family Home                                       Social Drivers of Health (SDOH) Interventions SDOH Screenings   Food Insecurity: No Food Insecurity (10/06/2024)  Housing: Low Risk  (10/06/2024)  Transportation Needs: No Transportation Needs (10/06/2024)  Utilities: Not At Risk (10/06/2024)  Social Connections: Unknown (10/06/2024)  Tobacco Use: Low Risk  (10/06/2024)  Recent Concern: Tobacco Use - Medium Risk (08/30/2024)   Received from [facility]    Readmission Risk Interventions    10/09/2024    4:00 PM  Readmission Risk Prevention Plan  Transportation Screening Complete  PCP or Specialist Appt within 5-7 Days Complete  Medication Review (RN CM) Complete

## 2024-10-14 NOTE — Progress Notes (Signed)
   Oct 12, 2024 1400  Spiritual Encounters  Type of Visit Follow up  Care provided to: Pt and family (Family is gathering around Pt.)  Referral source Chaplain assessment  Reason for visit End-of-life  OnCall Visit No  Spiritual Framework  Presenting Themes Meaning/purpose/sources of inspiration;Impactful experiences and emotions;Other (comment) (for Family)  Interventions  Spiritual Care Interventions Made Established relationship of care and support;Compassionate presence;Supported grief process;Other (comment) (for Family)  Intervention Outcomes  Outcomes Connection to spiritual care;Awareness of support;Other (comment) (for Family)

## 2024-10-14 NOTE — Progress Notes (Signed)
   2024-10-21 1100  Spiritual Encounters  Type of Visit Initial  Care provided to: Pt and family (Son w/Girlfriend and another Son at bedside)  Psychologist, [employer] partners present during Programmer, Systems  Referral source Nurse (RN/NT/LPN)  Reason for visit End-of-life  OnCall Visit No  Spiritual Framework  Presenting Themes Meaning/purpose/sources of inspiration;Goals in life/care;Values and beliefs;Other (comment) (for Family)  Interventions  Spiritual Care Interventions Made Established relationship of care and support;Compassionate presence;Reflective listening;Prayer;Other (comment);Supported grief process (for Family; Asked nurse for a bereavment tray; ordered)  Intervention Outcomes  Outcomes Connection to spiritual care;Connection to values and goals of care;Awareness around self/spiritual resourses;Awareness of support;Other (comment) (for Family)

## 2024-10-14 NOTE — Progress Notes (Signed)
 PT Cancellation Note  Patient Details Name: [name] Griffin MRN: [id number] DOB: 05/07/1928   Cancelled Treatment:    Reason Eval/Treat Not Completed: Medical issues which prohibited therapy (Per notes, patient transitioned to comfort care.  Therapy orders completed.  Please reconsult should needs/goals of care change.)   Mai Longnecker H. Delores, PT, DPT, NCS October 30, 2024, 11:07 AM [phone]

## 2024-10-14 NOTE — Death Summary Note (Signed)
 DEATH SUMMARY   Patient Details  Name: [name] Griffin MRN: [id number] DOB: [birth year] ERE:Xyjw, [name] RAMAN, MD Admission/Discharge Information   Admit Date:  10-16-24  Date of Death: Date of Death: 21-Oct-2024  Time of Death: Time of Death: 1600  Length of Stay: 5   Principle Cause of death: hypoxic respiratory failure secondary to healthcare associated pneumonia  Hospital Diagnoses: Principal Problem:   Acute on chronic respiratory failure with hypoxia and hypercapnia (HCC) Active Problems:   Obesity (BMI 30-39.9)   HCAP (healthcare-associated pneumonia)   Acute on chronic heart failure with preserved ejection fraction (HFpEF) (HCC)   Elevated troponin   Essential hypertension, benign   Type II diabetes mellitus with renal manifestations (HCC)   Chronic kidney disease, stage 3a (HCC)   Dementia (HCC)  [name] Griffin is a 89 y.o. female with medical history significant for Chronic HFpEF (EF 60 to 65%, G1 DD, 07/25/2024), stage IIIa CKD, HTN, DM, recently hospitalized (10/7 to 09/22/2024) with acute hypoxic respiratory failure secondary to multifocal pneumonia and CHF exacerbation, discharged on home O2 at 3 L, being admitted with worsening multifocal pneumonia, possible sepsis as well as CHF exacerbation.  EMS was called by patient's son who noted her to be very short of breath when trying to get out of bed.  On their arrival, she was tachycardic and hypoxic with O2 sats in the 70s.  She arrived on NRB, transitioned to HFNC on arrival.   # Acute hypoxic hypercapnic respiratory failure CTA no PE but shows multifocal infiltrates, infection/inflammation/ards on ddx, also with mild/mod effusions. On 15 liter [place] on arrival then weaned to 6 but overnight 10/25 worsening hypoxia now on high flow. Wth hypercarbia today, encephalopathic. Unable to wean from high flow last two days. Sons declined escalation in treatment. Transitioned to full comfort care on 10/21/24, started on morphing gtt,  passed several hours later.   # HCAP CXR showing signs multifocal pna. No significant cough however, no fevers, procal normal, covid and rvp neg. Does have mild leukocytosis.  Recent IV abx and hospitalization. Mrsa swab neg. Treated with 5 days meropenem here   # HFpEF with exacerbation # Tropinemia Bnp elevated. Preserved EF with mod phtn on tte in August. Trop 76>146>146. no EKG changes, not complaining of pain but demented. Likely demand. TTE with preserved EF, normal filling pressures. IV diuresed here though do not think this was primary driver of respiratory failure.   # Dementia   # Debility # History femur fracture Fracture August this year. Recent rehab, never regained mobility           Procedures: none  Consultations: none  The results of significant diagnostics from this hospitalization (including imaging, microbiology, ancillary and laboratory) are listed below for reference.   Significant Diagnostic Studies: CT Angio Chest Pulmonary Embolism (PE) W or WO Contrast Result Date: 10/07/2024 CLINICAL DATA:  89 year old with hypoxia. EXAM: CT ANGIOGRAPHY CHEST WITH CONTRAST TECHNIQUE: Multidetector CT imaging of the chest was performed using the standard protocol during bolus administration of intravenous contrast. Multiplanar CT image reconstructions and MIPs were obtained to evaluate the vascular anatomy. RADIATION DOSE REDUCTION: This exam was performed according to the departmental dose-optimization program which includes automated exposure control, adjustment of the mA and/or kV according to patient size and/or use of iterative reconstruction technique. CONTRAST:  75mL OMNIPAQUE  IOHEXOL  350 MG/ML SOLN COMPARISON:  Radiograph 10-16-2024, chest CTA 09/19/2024 FINDINGS: Cardiovascular: There are no filling defects within the pulmonary arteries to suggest pulmonary  embolus. Atherosclerosis with diffusely irregular atheromatous plaque throughout the thoracic aorta. Aortic  tortuosity. No acute aortic findings. The heart is enlarged. There are coronary artery calcifications. No pericardial effusion. Mediastinum/Nodes: Stable heterogeneous thyroid gland with substernal extension of left thyroid/isthmus nodule. No follow-up imaging is recommended based on patient's age. Redemonstrated mediastinal and bilateral hilar adenopathy. Many of the nodes demonstrate slight interval increase in size from prior exam, for example anterior paratracheal node measuring 12 mm, series 4, image 40 previously measured 10 mm. Decompressed esophagus. Lungs/Pleura: Moderate right pleural effusion has increased from prior exam, partially loculated at the apex. Small left pleural effusion has also increased. Diffuse bilateral ground-glass and confluent pulmonary opacities with progression from prior exam. Mild retained mucus within the tracheobronchial tree. Breathing motion artifact obscures detailed assessment. Upper Abdomen: No acute findings.  Stable adrenal thickening. Musculoskeletal: Scoliosis and degenerative change in the spine. Chronic bilateral shoulder arthropathy. Remote right proximal humeral fracture. Review of the MIP images confirms the above findings. IMPRESSION: 1. No pulmonary embolus. 2. Diffuse bilateral ground-glass and confluent pulmonary opacities with progression from prior exam. Differential consideration include infection, including atypical organisms, ARDS, or inflammatory etiologies. No element of pulmonary edema is also considered. 3. Moderate right and small left pleural effusions have increased from prior exam. 4. Mediastinal and bilateral hilar adenopathy, slightly increased from prior exam. This is nonspecific, but may be reactive. Aortic Atherosclerosis (ICD10-I70.0). Electronically Signed   By: [name] Griffin M.D.   On: 10/07/2024 17:01   ECHOCARDIOGRAM LIMITED Result Date: 10/06/2024    ECHOCARDIOGRAM LIMITED REPORT   Patient Name:   [name] Griffin [facility] Date of Exam:  10/06/2024 Medical Rec #:  [id number]       Height:       60.0 in Accession #:    [id number]      Weight:       153.9 lb Date of Birth:  [birth year]       BSA:          1.670 m Patient Age:    89 years        BP:           122/50 mmHg Patient Gender: F               HR:           64 bpm. Exam Location:  [facility] Procedure: Limited Echo, Color Doppler and Intracardiac Opacification Agent            (Both Spectral and Color Flow Doppler were utilized during            procedure). Indications:     CHF-Acute Diastolic I50.31  History:         Patient has prior history of Echocardiogram examinations, most                  recent 07/25/2024. CHF.  Sonographer:     Ashley McNeely-Sloane Referring Phys:  [id number] [facility] Ronaldo Crilly Diagnosing Phys: Keller Paterson IMPRESSIONS  1. Left ventricular ejection fraction, by estimation, is 60 to 65%. The left ventricle has normal function. The left ventricle has no regional wall motion abnormalities.  2. Right ventricular systolic function is normal. The right ventricular size is normal.  3. The mitral valve is normal in structure. Trivial mitral valve regurgitation.  4. The aortic valve is tricuspid. Aortic valve regurgitation is not visualized.  5. The inferior vena cava is normal in size with greater than 50% respiratory variability, suggesting right atrial  pressure of 3 mmHg. FINDINGS  Left Ventricle: Left ventricular ejection fraction, by estimation, is 60 to 65%. The left ventricle has normal function. The left ventricle has no regional wall motion abnormalities. Definity contrast agent was given IV to delineate the left ventricular  endocardial borders. The left ventricular internal cavity size was normal in size. Right Ventricle: The right ventricular size is normal. No increase in right ventricular wall thickness. Right ventricular systolic function is normal. Pericardium: There is no evidence of pericardial effusion. Mitral Valve: The mitral valve is normal in structure. Trivial  mitral valve regurgitation. Tricuspid Valve: The tricuspid valve is normal in structure. Tricuspid valve regurgitation is mild. Aortic Valve: The aortic valve is tricuspid. Aortic valve regurgitation is not visualized. Pulmonic Valve: The pulmonic valve was not well visualized. Pulmonic valve regurgitation is not visualized. Aorta: The aortic root is normal in size and structure. Venous: The inferior vena cava is normal in size with greater than 50% respiratory variability, suggesting right atrial pressure of 3 mmHg. Additional Comments: Color Doppler performed.   LV Volumes (MOD) LV vol d, MOD A2C: 46.8 ml LV vol d, MOD A4C: 36.5 ml LV vol s, MOD A2C: 14.8 ml LV vol s, MOD A4C: 14.1 ml LV SV MOD A2C:     32.0 ml LV SV MOD A4C:     36.5 ml LV SV MOD BP:      27.1 ml Keller Paterson Electronically signed by Keller Paterson Signature Date/Time: 10/06/2024/2:57:57 PM    Final    DG Chest Portable 1 View Result Date: 10/05/2024 EXAM: 1 VIEW(S) XRAY OF THE CHEST 10/05/2024 10:33:00 PM COMPARISON: 09/19/2024 CLINICAL HISTORY: shortness of breath. Pt to ED BIB [facility] with c/o shortness of breath, found to be satting in the 70s with EMS, recent dx of pneumonia. Pt arrives on NRB. EMS attempted to wean down to 6L but pt desatting into the 80s. Lung sounds clear, baseline dementia. FINDINGS: LUNGS AND PLEURA: Patchy interstitial and airspace opacities right breast from 10 / 7 / 25. Persistent small bilateral pleural effusions. No pneumothorax. HEART AND MEDIASTINUM: Aortic calcification. No acute abnormality of the cardiac and mediastinal silhouettes. BONES AND SOFT TISSUES: No acute osseous abnormality. IMPRESSION: 1. Findings favor worsening multifocal pneumonia compared to 10 / 7 / 25. small bilateral pleural effusions. Electronically signed by: Norman Gatlin MD 10/05/2024 10:42 PM EDT RP Workstation: [id number]   CT Angio Chest Pulmonary Embolism (PE) W or WO Contrast Result Date: 09/19/2024 CLINICAL DATA:   Pulmonary embolus suspected with high probability. Shortness of breath. Chronic diastolic congestive heart failure. EXAM: CT ANGIOGRAPHY CHEST WITH CONTRAST TECHNIQUE: Multidetector CT imaging of the chest was performed using the standard protocol during bolus administration of intravenous contrast. Multiplanar CT image reconstructions and MIPs were obtained to evaluate the vascular anatomy. RADIATION DOSE REDUCTION: This exam was performed according to the departmental dose-optimization program which includes automated exposure control, adjustment of the mA and/or kV according to patient size and/or use of iterative reconstruction technique. CONTRAST:  75mL OMNIPAQUE  IOHEXOL  350 MG/ML SOLN COMPARISON:  Chest radiograph 09/19/2024.  CT chest 07/23/2024 FINDINGS: Cardiovascular: Technically adequate study with good opacification of the central and segmental pulmonary arteries. No focal filling defects. No evidence of significant pulmonary embolus. Normal heart size. No pericardial effusions. Normal caliber thoracic aorta. Calcification of the aorta and coronary arteries. Great vessel origins are patent. Mediastinum/Nodes: Large substernal left thyroid gland nodule measuring 3.7 x 4.8 cm in diameter. No change since prior study. No imaging follow-up  is indicated based on patient's age. Mediastinal and hilar lymphadenopathy with largest pretracheal lymph nodes measuring up to about 1.6 cm short axis dimension. Lymphadenopathy is unchanged since prior study. Etiology is nonspecific, possibly reactive or neoplastic. Esophagus is decompressed. Small esophageal hiatal hernia. Lungs/Pleura: Small right pleural effusion. Diffuse alveolar and interstitial infiltrates throughout both lungs demonstrating progression since prior study. This is likely multifocal pneumonia or aspiration. Some mucous plugging is demonstrated in the lung bases. No pneumothorax. Upper Abdomen: 1.4 cm right adrenal gland nodule measuring 5 Hounsfield  units density and unchanged since prior study. This is likely a benign fat containing adenoma and no additional imaging follow-up is indicated. Musculoskeletal: Degenerative changes in the spine and shoulders. Old fracture deformity of the proximal right humerus. No acute bony abnormalities. Review of the MIP images confirms the above findings. IMPRESSION: 1. No evidence of significant pulmonary embolus. 2. Diffuse alveolar and interstitial infiltrates throughout both lungs with progression since prior study, likely representing multifocal pneumonia or aspiration. 3. Small right pleural effusion with atelectasis in the right base. 4. Aortic atherosclerosis. Electronically Signed   By: Elsie Gravely M.D.   On: 09/19/2024 21:26   US  Venous Img Lower Bilateral (DVT) Result Date: 09/19/2024 CLINICAL DATA:  220372 Positive D dimer [id number], pain edema and injury. EXAM: BILATERAL LOWER EXTREMITY VENOUS DOPPLER ULTRASOUND TECHNIQUE: Gray-scale sonography with graded compression, as well as color Doppler and duplex ultrasound were performed to evaluate the lower extremity deep venous systems from the level of the common femoral vein and including the common femoral, femoral, profunda femoral, popliteal and calf veins including the posterior tibial, peroneal and gastrocnemius veins when visible. The superficial great saphenous vein was also interrogated. Spectral Doppler was utilized to evaluate flow at rest and with distal augmentation maneuvers in the common femoral, femoral and popliteal veins. COMPARISON:  None Available. FINDINGS: RIGHT LOWER EXTREMITY Common Femoral Vein: No evidence of thrombus. Normal compressibility, respiratory phasicity and response to augmentation. Saphenofemoral Junction: No evidence of thrombus. Normal compressibility and flow on color Doppler imaging. Profunda Femoral Vein: No evidence of thrombus. Normal compressibility and flow on color Doppler imaging. Femoral Vein: No evidence of  thrombus. Normal compressibility, respiratory phasicity and response to augmentation. Popliteal Vein: No evidence of thrombus. Normal compressibility, respiratory phasicity and response to augmentation. Calf Veins: Limited evaluation of the peroneal vein. No posterior tibial vein thrombus. Superficial Great Saphenous Vein: No evidence of thrombus. Normal compressibility. Venous Reflux:  None. Other Findings:  None. LEFT LOWER EXTREMITY Common Femoral Vein: No evidence of thrombus. Normal compressibility, respiratory phasicity and response to augmentation. Saphenofemoral Junction: No evidence of thrombus. Normal compressibility and flow on color Doppler imaging. Profunda Femoral Vein: No evidence of thrombus. Normal compressibility and flow on color Doppler imaging. Femoral Vein: No evidence of thrombus. Normal compressibility, respiratory phasicity and response to augmentation. Popliteal Vein: No evidence of thrombus. Normal compressibility, respiratory phasicity and response to augmentation. Calf Veins: Limited evaluation. Superficial Great Saphenous Vein: No evidence of thrombus. Normal compressibility. Venous Reflux:  None. Other Findings:  None. IMPRESSION: No evidence of deep venous thrombosis in either lower extremity. Limited evaluation of the right perineal, left posterior tibial, left peroneal veins. Electronically Signed   By: Morgane  Naveau M.D.   On: 09/19/2024 20:45   DG Chest 2 View Result Date: 09/19/2024 CLINICAL DATA:  Shortness of breath, lethargy and weakness EXAM: CHEST - 2 VIEW COMPARISON:  07/23/2024 FINDINGS: Cardiomegaly. Diffuse bilateral interstitial opacity and small layering pleural effusions increased compared to prior  examination. Dextroscoliosis thoracic spine. Chronic fracture deformity of the proximal right humerus. Severe left glenohumeral arthrosis. IMPRESSION: Cardiomegaly with diffuse bilateral interstitial opacity and small layering pleural effusions increased compared to  prior examination, consistent with worsened edema. No focal airspace opacity. Electronically Signed   By: Marolyn JONETTA Jaksch M.D.   On: 09/19/2024 16:03    Microbiology: Recent Results (from the past 240 hours)  Blood Culture (routine x 2)     Status: None   Collection Time: 10/05/24 10:25 PM   Specimen: BLOOD  Result Value Ref Range Status   Specimen Description BLOOD BLOOD RIGHT WRIST  Final   Special Requests   Final    BOTTLES DRAWN AEROBIC AND ANAEROBIC Blood Culture results may not be optimal due to an inadequate volume of blood received in culture bottles   Culture   Final    NO GROWTH 5 DAYS Performed at [facility], [address] [place], [place] [address]    Report Status October 18, 2024 FINAL  Final  Resp panel by RT-PCR (RSV, Flu A&B, Covid) Anterior Nasal Swab     Status: None   Collection Time: 10/05/24 11:34 PM   Specimen: Anterior Nasal Swab  Result Value Ref Range Status   SARS Coronavirus 2 by RT PCR NEGATIVE NEGATIVE Final    Comment: (NOTE) SARS-CoV-2 target nucleic acids are NOT DETECTED.  The SARS-CoV-2 RNA is generally detectable in upper respiratory specimens during the acute phase of infection. The lowest concentration of SARS-CoV-2 viral copies this assay can detect is 138 copies/mL. A negative result does not preclude SARS-Cov-2 infection and should not be used as the sole basis for treatment or other patient management decisions. A negative result may occur with  improper specimen collection/handling, submission of specimen other than nasopharyngeal swab, presence of viral mutation(s) within the areas targeted by this assay, and inadequate number of viral copies(<138 copies/mL). A negative result must be combined with clinical observations, patient history, and epidemiological information. The expected result is Negative.  Fact Sheet for Patients:  bloggercourse.com  Fact Sheet for Healthcare Providers:   seriousbroker.it  This test is no t yet approved or cleared by the [place]  FDA and  has been authorized for detection and/or diagnosis of SARS-CoV-2 by FDA under an Emergency Use Authorization (EUA). This EUA will remain  in effect (meaning this test can be used) for the duration of the COVID-19 declaration under Section 564(b)(1) of the Act, 21 U.S.C.section 360bbb-3(b)(1), unless the authorization is terminated  or revoked sooner.       Influenza A by PCR NEGATIVE NEGATIVE Final   Influenza B by PCR NEGATIVE NEGATIVE Final    Comment: (NOTE) The Xpert Xpress SARS-CoV-2/FLU/RSV plus assay is intended as an aid in the diagnosis of influenza from Nasopharyngeal swab specimens and should not be used as a sole basis for treatment. Nasal washings and aspirates are unacceptable for Xpert Xpress SARS-CoV-2/FLU/RSV testing.  Fact Sheet for Patients: bloggercourse.com  Fact Sheet for Healthcare Providers: seriousbroker.it  This test is not yet approved or cleared by the [place]  FDA and has been authorized for detection and/or diagnosis of SARS-CoV-2 by FDA under an Emergency Use Authorization (EUA). This EUA will remain in effect (meaning this test can be used) for the duration of the COVID-19 declaration under Section 564(b)(1) of the Act, 21 U.S.C. section 360bbb-3(b)(1), unless the authorization is terminated or revoked.     Resp Syncytial Virus by PCR NEGATIVE NEGATIVE Final    Comment: (NOTE) Fact Sheet for  Patients: bloggercourse.com  Fact Sheet for Healthcare Providers: seriousbroker.it  This test is not yet approved or cleared by the [place]  FDA and has been authorized for detection and/or diagnosis of SARS-CoV-2 by FDA under an Emergency Use Authorization (EUA). This EUA will remain in effect (meaning this test can be used) for  the duration of the COVID-19 declaration under Section 564(b)(1) of the Act, 21 U.S.C. section 360bbb-3(b)(1), unless the authorization is terminated or revoked.  Performed at [facility], [address] [place], [place] [address]   Blood Culture (routine x 2)     Status: None (Preliminary result)   Collection Time: 10/06/24  1:33 AM   Specimen: BLOOD  Result Value Ref Range Status   Specimen Description BLOOD BLOOD RIGHT ARM  Final   Special Requests   Final    BOTTLES DRAWN AEROBIC AND ANAEROBIC Blood Culture results may not be optimal due to an inadequate volume of blood received in culture bottles   Culture   Final    NO GROWTH 4 DAYS Performed at [facility], [address], [place], [place] [address]    Report Status PENDING  Incomplete  MRSA Next Gen by PCR, Nasal     Status: None   Collection Time: 10/06/24  3:30 PM   Specimen: Nasal Mucosa; Nasal Swab  Result Value Ref Range Status   MRSA by PCR Next Gen NOT DETECTED NOT DETECTED Final    Comment: (NOTE) The GeneXpert MRSA Assay (FDA approved for NASAL specimens only), is one component of a comprehensive MRSA colonization surveillance program. It is not intended to diagnose MRSA infection nor to guide or monitor treatment for MRSA infections. Test performance is not FDA approved in patients less than 68 years old. Performed at [facility], [address] [place], [place] [address]   Respiratory (~20 pathogens) panel by PCR     Status: None   Collection Time: 10/06/24  3:46 PM   Specimen: Nasopharyngeal Swab; Respiratory  Result Value Ref Range Status   Adenovirus NOT DETECTED NOT DETECTED Final   Coronavirus 229E NOT DETECTED NOT DETECTED Final    Comment: (NOTE) The Coronavirus on the Respiratory Panel, DOES NOT test for the novel  Coronavirus (2019 nCoV)    Coronavirus HKU1 NOT DETECTED NOT DETECTED Final   Coronavirus NL63 NOT DETECTED NOT DETECTED Final   Coronavirus OC43  NOT DETECTED NOT DETECTED Final   Metapneumovirus NOT DETECTED NOT DETECTED Final   Rhinovirus / Enterovirus NOT DETECTED NOT DETECTED Final   Influenza A NOT DETECTED NOT DETECTED Final   Influenza B NOT DETECTED NOT DETECTED Final   Parainfluenza Virus 1 NOT DETECTED NOT DETECTED Final   Parainfluenza Virus 2 NOT DETECTED NOT DETECTED Final   Parainfluenza Virus 3 NOT DETECTED NOT DETECTED Final   Parainfluenza Virus 4 NOT DETECTED NOT DETECTED Final   Respiratory Syncytial Virus NOT DETECTED NOT DETECTED Final   Bordetella pertussis NOT DETECTED NOT DETECTED Final   Bordetella Parapertussis NOT DETECTED NOT DETECTED Final   Chlamydophila pneumoniae NOT DETECTED NOT DETECTED Final   Mycoplasma pneumoniae NOT DETECTED NOT DETECTED Final    Comment: Performed at [facility], [address], [place], [place] [address]    Time spent: 35 minutes  Signed: Devaughn KATHEE Ban, MD 2024/10/24

## 2024-10-14 NOTE — Plan of Care (Signed)
  Problem: Fluid Volume: Goal: Ability to maintain a balanced intake and output will improve Outcome: Progressing   Problem: Fluid Volume: Goal: Ability to maintain a balanced intake and output will improve Outcome: Progressing   Problem: Clinical Measurements: Goal: Ability to maintain clinical measurements within normal limits will improve Outcome: Progressing   Problem: Coping: Goal: Level of anxiety will decrease Outcome: Progressing   Problem: Elimination: Goal: Will not experience complications related to urinary retention Outcome: Progressing   Problem: Pain Managment: Goal: General experience of comfort will improve and/or be controlled Outcome: Progressing   Problem: Education: Goal: Ability to describe self-care measures that may prevent or decrease complications (Diabetes Survival Skills Education) will improve Outcome: Not Progressing   Problem: Education: Goal: Knowledge of General Education information will improve Description: Including pain rating scale, medication(s)/side effects and non-pharmacologic comfort measures Outcome: Not Progressing   Problem: Clinical Measurements: Goal: Cardiovascular complication will be avoided Outcome: Not Progressing   Problem: Activity: Goal: Risk for activity intolerance will decrease Outcome: Not Progressing

## 2024-10-14 NOTE — Progress Notes (Addendum)
 Patient deceased and was pronounced by 2 RN's Lourdes Leech & Sharlet Dawn) at 16:00 pm with family at bedside. Provider notified and family to let us  know when they leave.   [address] and reviewed requested information. Reference number [id number] and spoke to [employer].

## 2024-10-14 DEATH — deceased

## 2024-10-16 ENCOUNTER — Inpatient Hospital Stay: Admitting: Family

## 2025-02-26 ENCOUNTER — Ambulatory Visit: Admitting: Internal Medicine
